# Patient Record
Sex: Female | Born: 1955 | Race: White | Hispanic: No | Marital: Married | Smoking: Former smoker
Health system: Southern US, Community
[De-identification: ages and names within clinical notes are randomized; demographics above are authoritative.]

## PROBLEM LIST (undated history)

## (undated) DIAGNOSIS — S60529A Blister (nonthermal) of unspecified hand, initial encounter: Secondary | ICD-10-CM

## (undated) DIAGNOSIS — Z8619 Personal history of other infectious and parasitic diseases: Secondary | ICD-10-CM

## (undated) DIAGNOSIS — J40 Bronchitis, not specified as acute or chronic: Secondary | ICD-10-CM

## (undated) DIAGNOSIS — G8929 Other chronic pain: Secondary | ICD-10-CM

## (undated) DIAGNOSIS — A6920 Lyme disease, unspecified: Secondary | ICD-10-CM

## (undated) DIAGNOSIS — Z8489 Family history of other specified conditions: Secondary | ICD-10-CM

## (undated) DIAGNOSIS — M1990 Unspecified osteoarthritis, unspecified site: Secondary | ICD-10-CM

## (undated) DIAGNOSIS — E785 Hyperlipidemia, unspecified: Secondary | ICD-10-CM

## (undated) DIAGNOSIS — F988 Other specified behavioral and emotional disorders with onset usually occurring in childhood and adolescence: Secondary | ICD-10-CM

## (undated) DIAGNOSIS — F329 Major depressive disorder, single episode, unspecified: Secondary | ICD-10-CM

## (undated) DIAGNOSIS — F32A Depression, unspecified: Secondary | ICD-10-CM

## (undated) DIAGNOSIS — M8580 Other specified disorders of bone density and structure, unspecified site: Secondary | ICD-10-CM

## (undated) DIAGNOSIS — G5603 Carpal tunnel syndrome, bilateral upper limbs: Secondary | ICD-10-CM

## (undated) DIAGNOSIS — M797 Fibromyalgia: Secondary | ICD-10-CM

## (undated) DIAGNOSIS — L309 Dermatitis, unspecified: Secondary | ICD-10-CM

## (undated) DIAGNOSIS — G4730 Sleep apnea, unspecified: Secondary | ICD-10-CM

## (undated) DIAGNOSIS — Z9289 Personal history of other medical treatment: Secondary | ICD-10-CM

## (undated) DIAGNOSIS — F419 Anxiety disorder, unspecified: Secondary | ICD-10-CM

## (undated) DIAGNOSIS — R011 Cardiac murmur, unspecified: Secondary | ICD-10-CM

## (undated) HISTORY — DX: Hyperlipidemia, unspecified: E78.5

## (undated) HISTORY — PX: BACK SURGERY: SHX140

## (undated) HISTORY — PX: ABDOMINAL HYSTERECTOMY: SHX81

## (undated) HISTORY — DX: Dermatitis, unspecified: L30.9

## (undated) HISTORY — PX: CARPAL TUNNEL RELEASE: SHX101

## (undated) HISTORY — DX: Lyme disease, unspecified: A69.20

## (undated) HISTORY — DX: Personal history of other infectious and parasitic diseases: Z86.19

## (undated) HISTORY — DX: Other specified disorders of bone density and structure, unspecified site: M85.80

## (undated) HISTORY — PX: EYE SURGERY: SHX253

## (undated) HISTORY — PX: JOINT REPLACEMENT: SHX530

## (undated) HISTORY — DX: Unspecified osteoarthritis, unspecified site: M19.90

## (undated) HISTORY — PX: KNEE ARTHROSCOPY: SHX127

## (undated) HISTORY — DX: Cardiac murmur, unspecified: R01.1

## (undated) HISTORY — PX: TENDON REPAIR: SHX5111

---

## 2000-04-15 ENCOUNTER — Other Ambulatory Visit: Admission: RE | Admit: 2000-04-15 | Discharge: 2000-04-15 | Payer: Self-pay | Admitting: Orthopedic Surgery

## 2001-01-12 ENCOUNTER — Ambulatory Visit (HOSPITAL_BASED_OUTPATIENT_CLINIC_OR_DEPARTMENT_OTHER): Admission: RE | Admit: 2001-01-12 | Discharge: 2001-01-12 | Payer: Self-pay | Admitting: *Deleted

## 2001-02-08 ENCOUNTER — Ambulatory Visit (HOSPITAL_BASED_OUTPATIENT_CLINIC_OR_DEPARTMENT_OTHER): Admission: RE | Admit: 2001-02-08 | Discharge: 2001-02-08 | Payer: Self-pay | Admitting: *Deleted

## 2001-07-13 ENCOUNTER — Encounter: Payer: Self-pay | Admitting: *Deleted

## 2001-07-13 ENCOUNTER — Encounter: Admission: RE | Admit: 2001-07-13 | Discharge: 2001-07-13 | Payer: Self-pay | Admitting: *Deleted

## 2001-10-04 ENCOUNTER — Other Ambulatory Visit: Admission: RE | Admit: 2001-10-04 | Discharge: 2001-10-04 | Payer: Self-pay | Admitting: Family Medicine

## 2002-01-05 ENCOUNTER — Encounter: Payer: Self-pay | Admitting: *Deleted

## 2002-01-05 ENCOUNTER — Encounter: Admission: RE | Admit: 2002-01-05 | Discharge: 2002-01-05 | Payer: Self-pay | Admitting: *Deleted

## 2003-12-28 HISTORY — PX: LAPAROSCOPIC GASTRIC BANDING: SHX1100

## 2003-12-28 HISTORY — PX: HIP SURGERY: SHX245

## 2004-01-22 DIAGNOSIS — Z9884 Bariatric surgery status: Secondary | ICD-10-CM | POA: Insufficient documentation

## 2004-08-27 DIAGNOSIS — Z9289 Personal history of other medical treatment: Secondary | ICD-10-CM

## 2004-08-27 HISTORY — DX: Personal history of other medical treatment: Z92.89

## 2004-09-22 ENCOUNTER — Ambulatory Visit: Payer: Self-pay | Admitting: Physical Medicine & Rehabilitation

## 2004-09-22 ENCOUNTER — Inpatient Hospital Stay (HOSPITAL_COMMUNITY): Admission: RE | Admit: 2004-09-22 | Discharge: 2004-09-25 | Payer: Self-pay | Admitting: Orthopedic Surgery

## 2008-03-12 ENCOUNTER — Encounter: Admission: RE | Admit: 2008-03-12 | Discharge: 2008-03-12 | Payer: Self-pay | Admitting: Family Medicine

## 2008-07-15 ENCOUNTER — Encounter: Admission: RE | Admit: 2008-07-15 | Discharge: 2008-07-15 | Payer: Self-pay | Admitting: Family Medicine

## 2010-04-11 ENCOUNTER — Emergency Department (HOSPITAL_COMMUNITY): Admission: EM | Admit: 2010-04-11 | Discharge: 2010-04-12 | Payer: Self-pay | Admitting: Emergency Medicine

## 2010-10-01 ENCOUNTER — Encounter: Admission: RE | Admit: 2010-10-01 | Discharge: 2010-10-01 | Payer: Self-pay | Admitting: Specialist

## 2011-05-14 NOTE — Op Note (Signed)
Abigail Huerta, [name]:  [id number]   MEDICAL RECORD NO.:  [id number]          PATIENT TYPE:  INP   LOCATION:  X004                         FACILITY:  [facility]   PHYSICIAN:  Madlyn Frankel. Charlann Boxer, M.D.  DATE OF BIRTH:  1956/02/04   DATE OF PROCEDURE:  09/22/2004  DATE OF DISCHARGE:                                 OPERATIVE REPORT   PREOPERATIVE DIAGNOSIS:  Right hip osteoarthritis.   POSTOPERATIVE DIAGNOSIS:  Right hip osteoarthritis.   PROCEDURE:  Right total hip replacement.   COMPONENTS USED:  DePuy hip system TriLock 12.5 standard hip stem with a 36  1.5 mm ball, a 36 + 1.5 mm ball, a 52 mm Pinnacle cup and a 36 neutral metal  liner.   SURGEON:  Madlyn Frankel. Charlann Boxer, M.D.   ASSISTANTDruscilla Brownie. Cherlynn June.   ANESTHESIA:  General.   BLOOD LOSS:  800 cc.   IV FLUIDS:  2500 cc LR.   DRAINS:  One.   COMPLICATIONS:  None.   INDICATIONS FOR PROCEDURE:  Abigail Huerta is a 55 year old female with an  extensive history of right hip pain.  She also has chronic pain related to  some back issues.  She has been followed and had multiple conservative  measures, including intra-articular hip injections, which failed to provide  relief.  After several attempts at managing her right hip pain presenting as  groin pain with conservative measures, she grew leery of further attempts.  After this, we discussed hip replacement as an option for her.  We discussed  risks and benefits of the procedure.  We discussed bearing surfaces, and  after this discussion, she consented with a right total hip replacement and  wished to have a metal-on-metal bearing surface, given her age.   PROCEDURE IN DETAIL:  Patient was brought to the operating theater.  Once  adequate anesthesia, preoperative antibiotics were administered, 1 gm of  Ancef.  The patient was positioned in the left lateral decubitus position  with the right side up.  The right lower extremity was then prepped and  draped in a sterile fashion.  A slightly curvilinear lateral-based incision  was made for the posterior approach to the hip.  Short external rotators and  __________ were taken down separate from the posterior capsule.  Both were  later than repaired to the trochanter and gluteus medius tendon  respectively.  The hip was dislocated, and a neck osteotomy made, based on  preoperative templates and anatomic landmarks.  At this point, attention was  directed to the femur.  The femur was prepared for a trial of locked hip  stem.  Initial broaching was carried up to an 11.3 mm broach to allow for  assessment of the amount of eversion present in the femur that would then be  able to dictate more so on the acetabulum, as necessary.  Following this,  attention was directed to the acetabulum.  Following acetabular exposure,  including labrectomy and exposure, the reaming commenced with a 45 reamer,  which was carried up sequentially to a 51 reamer.  This provided excellent  punctate  acetabular bone bleeding.  The 52 mm cup was then packed with  approximately 40-45 degrees of abduction, 20-15 degrees of forward flexion.  Cup position appeared adequate, compared to the anteversion in the femur.  Two screws were placed into the ileum with excellent purchase.  Then a trial  liner placed and attention redirected to the femur.   Final broaching was carried up with an 11.3, then a 12.5 mm broach.  Approximately 15 degrees of anteversion was present in the stem.  A trial  reduction was carried out with a 36.5 ball.  The hip was stable.  Range of  motion with hip flexion to 80 degrees, internal rotation to 70 degrees.  Stable in the sitting position.  The leg length appeared stable.  The  patient was stable with external rotation and internal extension.  Following  this, the trial components were removed.  The whole liner replaced with a  final 36 neutral metal liner impacted into this position.  The final  12.5  standard trial length stem was then impacted to the level of the broach  position.  A Fit 36 +1.5 ball was impacted on the clean and dry trunnion,  and the hip reduced.  The hip was stable, as noted in the trial range of  motion.  The hip was copiously irrigated with normal saline solution.  A  medium Hemovac drain was placed deep.  The posterior capsule was  reapproximated to the trochanter through drill holes.  Short external  rotators were reapproximated in the gluteus medius.  Following this, the  iliotibial band and gluteus maximus fascia were reapproximated using a #1  Vicryl.  At this point, the remainder of the wound was closed in layers with  running 4-0 Monocryl in the skin.  The patient was extubated and transferred  to the recovery room in stable condition.      MDO/MEDQ  D:  09/22/2004  T:  09/22/2004  Job:  [id number]

## 2011-05-14 NOTE — Discharge Summary (Signed)
NAMEJALIE, Abigail Huerta              ACCOUNT NO.:  [id number]   MEDICAL RECORD NO.:  [id number]          PATIENT TYPE:  INP   LOCATION:  0462                         FACILITY:  [facility]   PHYSICIAN:  Madlyn Frankel. Charlann Boxer, M.D.  DATE OF BIRTH:  1956/06/26   DATE OF ADMISSION:  09/22/2004  DATE OF DISCHARGE:  09/25/2004                                 DISCHARGE SUMMARY   ADMITTING DIAGNOSES:  1.  Severe osteoarthritis of the right hip.  2.  Seasonal allergies.   DISCHARGE DIAGNOSES:  1.  Severe osteoarthritis of the right hip.  2.  Seasonal allergies.  3.  Postoperative anemia treated with transfusion.   OPERATION:  On September 22, 2004, the patient underwent right total hip  replacement arthroplasty utilizing Dupuy Hip System with metal-on-metal  prosthesis.  Jenne Campus assisted.   BRIEF HISTORY:  This 55 year old lady with a long history of right hip pain  has had conservative measures for treatment of the right hip pain including  intraarticular hip injections, anti-inflammatories which have not provided  her with any long-lasting relief.  X-rays have shown severe degenerative  arthritis of the hip.  After much discussion and concluding the risks and  benefits of surgery, we have decided to go ahead with the above procedure.   The patient also has chronic pain secondary to some back issues treated by  Dr. Ethelene Hal.   HOSPITAL COURSE:  The patient tolerated her surgical procedure quite well.  She had an extremely high need for analgesics postoperatively.  We supplied  her with these utilizing PCA pump as well as her Duragesic patch, Vicodin,  and Percocet.  Zanaflex was used as a muscle relaxant.  She used these  freely.  We were able to wean her eventually from the PCA, then to Vicodin.  We continued with her Duragesic patch.  Hemoglobin became somewhat critical  postoperatively, dropping to 8.5.  She was quite pale and after a discussion  with her and her approval she was  transfused with 2 units autologous blood  for her postoperative anemia.  This brought her hemoglobin up to 10.4 with  hematocrit of 30.7.  On the morning of discharge after transfusion, she felt  much better and was more alert.  She was completely and fully oriented,  anxious to go home at the time of discharge.  Dr. Charlann Boxer saw the patient prior  to discharge and cleared her for home health.  At the time of discharge, the  patient was walking 90 feet with a rolling walker, able to maintain 50%  partial weightbearing to the right lower extremity.  Neurovascular was  intact to the right hip.  Dressing was dry.   Early on, we thought she might be of benefit for an inpatient rehabilitation  program.  Dr. Thomasena Edis saw the patient from [facility] and since her high  level of activity was achieved in the hospital that she would do best with  home health.   Laboratory values in the hospital hematologically showed a preoperative CBC  completely within normal limits.  Hemoglobin was 12.5, hematocrit was 36.6.  Final  hemoglobin showed 10.4 with hematocrit of 30.7.  Blood chemistries  remained stable and normal.  Urinalysis essentially negative for urinary  tract infection.  Blood transfused was O+.  Chest x-ray showed a normal  chest.  Electrocardiogram showed sinus bradycardia with low-voltage QRS.   CONDITION ON DISCHARGE:  Improved, stable.   PLAN:  The patient discharged to her home in the care of her family.  She  plans to be with her sister.  She is to have home health with [place].  Continue on Coumadin protocol four weeks after the date of surgery.  She is  to continue 50% weightbearing with a partial weightbearing to the right  lower extremity.  Discontinue TED hose when she is feeling better and  ambulating better.  She is to continue with home medications and diet.  She  is given a prescription for Vicodin #50 with a refill 1-2 q.4-6 h. p.r.n.  pain, Zanaflex #40 with a refill 1-2 q.4-6  h. p.r.n. muscle spasm, Trinsicon  #60 one b.i.d., Duragesic patch  50 mcg #10 change the patch every 72 hours, and Coumadin per pharmacy  protocol.  May use dry dressing p.r.n.  Shower on the fourth postoperative  day.  Continue with hip precautions.      DLU/MEDQ  D:  09/25/2004  T:  09/25/2004  Job:  [id number]   cc:   Gretta Arab. Valentina Lucks, M.D.  [address] [place]  [place] [address]  Fax: [phone]

## 2011-05-14 NOTE — H&P (Signed)
NAMELILYANNE, Abigail Huerta              ACCOUNT NO.:  [id number]   MEDICAL RECORD NO.:  [id number]          PATIENT TYPE:  INP   LOCATION:  NA                           FACILITY:  [facility]   PHYSICIAN:  Madlyn Frankel. Charlann Boxer, M.D.  DATE OF BIRTH:  1956-01-31   DATE OF ADMISSION:  09/22/2004  DATE OF DISCHARGE:                                HISTORY & PHYSICAL   BRIEF HISTORY:  Patient is a 55 year old female comes in complaining about  right hip pain.  She states that this right hip pain has been off and on for  the past 20 years.  It has been continually getting worse.  Her symptoms  have been refractory to any type of conservative therapy or injections.  Thus, she has elected to have a total hip arthroplasty to the right hip to  be completed by Dr. Durene Romans.   ALLERGIES AND SENSITIVITIES:  MORPHINE.   MEDICATIONS:  1.  Zyrtec 10 mg once daily.  2.  Paxil 20 mg once daily.  3.  Wellbutrin 300 mg once daily.  4.  Ultram 50 mg q.6h. p.r.n. pain.  5.  Ativan 1 mg t.i.d. p.r.n. pain.  6.  Duragesic patch daily.   PAST MEDICAL HISTORY:  Seasonal allergies and osteoarthritis right hip.   SOCIAL HISTORY:  She does not smoke, she lives in a two-level house, and her  primary care physician is [employer].   PAST SURGICAL HISTORY:  Hysterectomy and a lap band bariatric surgery.   FAMILY MEDICAL HISTORY:  Negative.   REVIEW OF SYSTEMS:  Negative except for difficulty with ambulation due to  the right hip pain.   PHYSICAL EXAMINATION:  Patient has a pulse of 82, respirations 18, blood  pressure 110/78.  Abigail Huerta is a 55 year old healthy-appearing female in  no acute distress, pleasant mood and affect, alert and oriented x3.  Examination of head and neck shows cranial nerves II-XII grossly intact.  Patient is somewhat hoarse today here in the office but no signs of gross  infection, sinusitis, or strep throat.  Neck has full range of motion  without any difficulty, no tenderness to  palpation paraspinal muscles  spinous process.  Spurling test negative x2.  Examination of chest shows  breath sounds are equal bilaterally, no wheezes, rhonchi, or rales.  Heart  regular rate and rhythm, no murmur.  Abdomen has active bowel sounds in all  quadrants, nontender, nondistended, no pulsatile masses noted.  Extremities  shows moderate tenderness to the right hip.  She does have an antalgic gait  due to this hip pain but neurovascularly she is intact.  Skin is intact  distally.  She does have some mild tenderness to the lumbar spine between L4  through S1 with no active muscle spasm.  Skin shows no rashes or signs of  phlebitis and no pedal edema.   X-RAYS:  Right hip osteoarthritis, severe.   IMPRESSION:  Right hip osteoarthritis, severe.   PLAN:  Plan is to have a right total hip arthroplasty to be completed by Dr.  Durene Romans on September 22, 2004.  TBD/MEDQ  D:  09/16/2004  T:  09/16/2004  Job:  [id number]

## 2011-06-29 ENCOUNTER — Emergency Department (HOSPITAL_COMMUNITY): Payer: Medicare Other

## 2011-06-29 ENCOUNTER — Emergency Department (HOSPITAL_COMMUNITY)
Admission: EM | Admit: 2011-06-29 | Discharge: 2011-06-29 | Disposition: A | Discharge status: Home / Self Care | Payer: Medicare Other | Attending: Emergency Medicine | Admitting: Emergency Medicine

## 2011-06-29 DIAGNOSIS — M25559 Pain in unspecified hip: Secondary | ICD-10-CM | POA: Insufficient documentation

## 2011-06-29 DIAGNOSIS — Z79899 Other long term (current) drug therapy: Secondary | ICD-10-CM | POA: Insufficient documentation

## 2011-06-29 DIAGNOSIS — Z96649 Presence of unspecified artificial hip joint: Secondary | ICD-10-CM | POA: Insufficient documentation

## 2011-07-26 ENCOUNTER — Other Ambulatory Visit: Payer: Self-pay | Admitting: Orthopedic Surgery

## 2011-07-26 DIAGNOSIS — M4800 Spinal stenosis, site unspecified: Secondary | ICD-10-CM

## 2011-07-26 MED ORDER — DIAZEPAM 2 MG PO TABS
10.0000 mg | ORAL_TABLET | Freq: Once | ORAL | Status: AC
  Administered 2011-07-27: 10 mg via ORAL

## 2011-07-27 ENCOUNTER — Ambulatory Visit
Admission: RE | Admit: 2011-07-27 | Discharge: 2011-07-27 | Disposition: A | Discharge status: Home / Self Care | Payer: Medicare Other | Source: Ambulatory Visit | Attending: Orthopedic Surgery | Admitting: Orthopedic Surgery

## 2011-07-27 DIAGNOSIS — M4800 Spinal stenosis, site unspecified: Secondary | ICD-10-CM

## 2011-07-27 DIAGNOSIS — M48061 Spinal stenosis, lumbar region without neurogenic claudication: Secondary | ICD-10-CM

## 2011-07-27 MED ORDER — IOHEXOL 180 MG/ML  SOLN
15.0000 mL | Freq: Once | INTRAMUSCULAR | Status: AC | PRN
  Administered 2011-07-27: 15 mL via INTRATHECAL

## 2011-07-27 MED ORDER — DEXTROSE-NACL 5-0.45 % IV SOLN: INTRAVENOUS | Status: DC

## 2011-07-27 NOTE — Progress Notes (Signed)
Resting quietly in recovery area.  Denies pain.  jl

## 2012-01-11 DIAGNOSIS — E785 Hyperlipidemia, unspecified: Secondary | ICD-10-CM | POA: Diagnosis not present

## 2012-01-11 DIAGNOSIS — E669 Obesity, unspecified: Secondary | ICD-10-CM | POA: Diagnosis not present

## 2012-01-11 DIAGNOSIS — G4733 Obstructive sleep apnea (adult) (pediatric): Secondary | ICD-10-CM | POA: Diagnosis not present

## 2012-01-11 DIAGNOSIS — IMO0002 Reserved for concepts with insufficient information to code with codable children: Secondary | ICD-10-CM | POA: Diagnosis not present

## 2012-01-11 DIAGNOSIS — K219 Gastro-esophageal reflux disease without esophagitis: Secondary | ICD-10-CM | POA: Diagnosis not present

## 2012-01-11 DIAGNOSIS — M949 Disorder of cartilage, unspecified: Secondary | ICD-10-CM | POA: Diagnosis not present

## 2012-01-11 DIAGNOSIS — F411 Generalized anxiety disorder: Secondary | ICD-10-CM | POA: Diagnosis not present

## 2012-01-11 DIAGNOSIS — G8929 Other chronic pain: Secondary | ICD-10-CM | POA: Diagnosis not present

## 2012-03-10 DIAGNOSIS — M4310 Spondylolisthesis, site unspecified: Secondary | ICD-10-CM | POA: Diagnosis not present

## 2012-03-10 DIAGNOSIS — M5137 Other intervertebral disc degeneration, lumbosacral region: Secondary | ICD-10-CM | POA: Diagnosis not present

## 2012-03-10 DIAGNOSIS — M5030 Other cervical disc degeneration, unspecified cervical region: Secondary | ICD-10-CM | POA: Diagnosis not present

## 2012-04-13 DIAGNOSIS — F319 Bipolar disorder, unspecified: Secondary | ICD-10-CM | POA: Diagnosis not present

## 2012-04-13 DIAGNOSIS — J040 Acute laryngitis: Secondary | ICD-10-CM | POA: Diagnosis not present

## 2012-04-17 DIAGNOSIS — F319 Bipolar disorder, unspecified: Secondary | ICD-10-CM | POA: Diagnosis not present

## 2012-04-28 DIAGNOSIS — E785 Hyperlipidemia, unspecified: Secondary | ICD-10-CM | POA: Diagnosis not present

## 2012-05-02 DIAGNOSIS — M545 Low back pain: Secondary | ICD-10-CM | POA: Diagnosis not present

## 2012-05-15 DIAGNOSIS — F319 Bipolar disorder, unspecified: Secondary | ICD-10-CM | POA: Diagnosis not present

## 2012-05-19 DIAGNOSIS — M5137 Other intervertebral disc degeneration, lumbosacral region: Secondary | ICD-10-CM | POA: Diagnosis not present

## 2012-05-19 DIAGNOSIS — M4120 Other idiopathic scoliosis, site unspecified: Secondary | ICD-10-CM | POA: Diagnosis not present

## 2012-05-20 ENCOUNTER — Encounter (HOSPITAL_COMMUNITY): Payer: Self-pay | Admitting: Licensed Clinical Social Worker

## 2012-05-20 ENCOUNTER — Ambulatory Visit (HOSPITAL_COMMUNITY)
Admission: AD | Admit: 2012-05-20 | Discharge: 2012-05-20 | Disposition: A | Discharge status: Home / Self Care | Payer: Managed Care, Other (non HMO) | Attending: Psychiatry | Admitting: Psychiatry

## 2012-05-20 DIAGNOSIS — Z9884 Bariatric surgery status: Secondary | ICD-10-CM | POA: Diagnosis not present

## 2012-05-20 DIAGNOSIS — F909 Attention-deficit hyperactivity disorder, unspecified type: Secondary | ICD-10-CM | POA: Insufficient documentation

## 2012-05-20 DIAGNOSIS — F39 Unspecified mood [affective] disorder: Secondary | ICD-10-CM | POA: Diagnosis not present

## 2012-05-20 DIAGNOSIS — M129 Arthropathy, unspecified: Secondary | ICD-10-CM | POA: Diagnosis not present

## 2012-05-20 DIAGNOSIS — F411 Generalized anxiety disorder: Secondary | ICD-10-CM | POA: Insufficient documentation

## 2012-05-20 DIAGNOSIS — IMO0001 Reserved for inherently not codable concepts without codable children: Secondary | ICD-10-CM | POA: Diagnosis not present

## 2012-05-20 HISTORY — DX: Anxiety disorder, unspecified: F41.9

## 2012-05-20 HISTORY — DX: Depression, unspecified: F32.A

## 2012-05-20 HISTORY — DX: Unspecified osteoarthritis, unspecified site: M19.90

## 2012-05-20 HISTORY — DX: Major depressive disorder, single episode, unspecified: F32.9

## 2012-05-20 HISTORY — DX: Fibromyalgia: M79.7

## 2012-05-20 NOTE — BH Assessment (Signed)
Assessment Note   Abigail Huerta is an 56 y.o. female, married, white who presents to [facility] saying "I'm nuts." Pt's husband reports "I think she is having a nervous breakdown." Pt reports she has a long history of depression and anxiety and has been treated for years, primarily by her PCP's. She recently began seeing Tiajuana Amass who reportedly diagnosed Pt with ADD. Pt reports she has been feeling severely depressed "since February 27, 2012 at 4:30 am when my dog Sirus died." Pt reports daily crying spells, severe anxiety with panic attacks, frustration, poor concentration, social withdrawal and feelings of hopelessness. She and her husband both reports Pt gets 1-3 hours of sleep at night and a couple of hours sleep during the day. She denies any current or past suicidal ideation, suicidal gestures or self-harm behaviors. She denies any homicidal ideation or history of violence. She denies any psychotic symptoms.  When asked why she came for assessment today she reports that she had conflicts with two of her sisters, one conflict involving her sister's dog. Pt reports she has been under stress due to financial problems and she and her husband have filed for bankruptcy. She also has multiple medical problems including degenerative disc disease, fibromyalgia, arthritis, chronic pain (currently 5/10) and a skin condition. She feels overwhelmed and feels she cannot manage daily stress and activities.   Pt reports she takes her medications as prescribed. She cannot remember the details of her medications and reports they include Lexapro, Vyvance, Oxycodone, transdermal patch, Ativan and Zanaflex. She says her pharmacy is [facility] and she has the pharmacy telephone number memorized.   Pt reports her mother and her grandmother both have serious mental health problems. Pt anticipated inpatient psychiatric treatment and said she was interested in "shock treatments." Explained to Pt that usually people  were admitted inpatient for imminent safety issues and recommended she attend Psych IOP. Pt agreed to attend program and would like to start as soon as possible.   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 Depression   . Anxiety   . Fibromyalgia   . Arthritis    Axis IV: educational problems and problems related to social environment Axis V: GAF=50  Past Medical History:  Past Medical History  Diagnosis Date  . Depression   . Anxiety   . Fibromyalgia   . Arthritis     Past Surgical History  Procedure Date  . Hip surgery 2005    Hip replacement  . Laparoscopic gastric banding 2005    Family History: No family history on file.  Social History:  reports that she has never smoked. She does not have any smokeless tobacco history on file. She reports that she does not drink alcohol or use illicit drugs.  Additional Social History:  Alcohol / Drug Use Pain Medications: Denies Prescriptions: Denies Over the Counter: Denies History of alcohol / drug use?: No history of alcohol / drug abuse Longest period of sobriety (when/how long): Denies  CIWA:   COWS:    Allergies: No Known Allergies  Home Medications:  (Not in a hospital admission)  OB/GYN Status:  No LMP recorded.  General Assessment Data Location of Assessment: [facility] Assessment Services Living Arrangements: Spouse/significant other;Children (Husband, son (62), granddaughter) Can pt return to current living arrangement?: Yes Admission Status: Voluntary Is patient capable of signing voluntary admission?: Yes Transfer from: Home Referral Source: Self/Family/Friend  Education  Status Is patient currently in school?: No  Risk to self Suicidal Ideation: No Suicidal Intent: No Is patient at risk for suicide?: No Suicidal Plan?: No Access to Means: No What has been your use of drugs/alcohol within the last 12 months?: Pt  denies Previous Attempts/Gestures: No How many times?: 0  Other Self Harm Risks: None Triggers for Past Attempts: None known Intentional Self Injurious Behavior: None Family Suicide History: See progress notes;No Recent stressful life event(s): Financial Problems;Conflict (Comment) (Bankruptcy, conflict with sisters) Persecutory voices/beliefs?: No Depression: Yes Depression Symptoms: Despondent;Insomnia;Tearfulness;Isolating;Fatigue;Loss of interest in usual pleasures Substance abuse history and/or treatment for substance abuse?: No Suicide prevention information given to non-admitted patients: Yes  Risk to Others Homicidal Ideation: No Thoughts of Harm to Others: No Current Homicidal Intent: No Current Homicidal Plan: No Access to Homicidal Means: No Identified Victim: None  History of harm to others?: No Assessment of Violence: None Noted Violent Behavior Description: Pt denies any history of violence Does patient have access to weapons?: No Criminal Charges Pending?: No Does patient have a court date: No  Psychosis Hallucinations: None noted Delusions: None noted  Mental Status Report Appear/Hygiene: Other (Comment) (Somewhat desheveled) Eye Contact: Good Motor Activity: Unremarkable Speech: Logical/coherent Level of Consciousness: Alert Mood: Depressed Affect: Depressed;Other (Comment) (Affect not always congruent with mood) Anxiety Level: Panic Attacks Panic attack frequency: 2-3 times per month Most recent panic attack: 1 week ago Thought Processes: Coherent Judgement: Unimpaired Orientation: Person;Place;Time;Situation Obsessive Compulsive Thoughts/Behaviors: None  Cognitive Functioning Concentration: Decreased Memory: Recent Intact;Remote Intact IQ: Average Insight: Fair Impulse Control: Fair Appetite: Good Weight Loss: 0  Weight Gain: 0  Sleep: Decreased Total Hours of Sleep: 3  Vegetative Symptoms: Staying in bed  ADLScreening [facility] Assessment  Services) Patient's cognitive ability adequate to safely complete daily activities?: Yes Patient able to express need for assistance with ADLs?: Yes Independently performs ADLs?: Yes  Abuse/Neglect [facility]) Physical Abuse: Denies Verbal Abuse: Denies Sexual Abuse: Denies  Prior Inpatient Therapy Prior Inpatient Therapy: No Prior Therapy Dates: NA Prior Therapy Facilty/Provider(s): NA Reason for Treatment: NA  Prior Outpatient Therapy Prior Outpatient Therapy: Yes Prior Therapy Dates: 03/2012-Current Prior Therapy Facilty/Provider(s): Tiajuana Amass, MD Reason for Treatment: Depression, anxiety, ADD  ADL Screening (condition at time of admission) Patient's cognitive ability adequate to safely complete daily activities?: Yes Patient able to express need for assistance with ADLs?: Yes Independently performs ADLs?: Yes Weakness of Legs: None Weakness of Arms/Hands: None  Home Assistive Devices/Equipment Home Assistive Devices/Equipment: None    Abuse/Neglect Assessment (Assessment to be complete while patient is alone) Physical Abuse: Denies Verbal Abuse: Denies Sexual Abuse: Denies Exploitation of patient/patient's resources: Denies Self-Neglect: Denies     Merchant navy officer (For Healthcare) Advance Directive: Patient does not have advance directive;Patient would not like information Pre-existing out of facility DNR order (yellow form or pink MOST form): No Nutrition Screen Diet: Regular Unintentional weight loss greater than 10lbs within the last month: No Problems chewing or swallowing foods and/or liquids: No Home Tube Feeding or Total Parenteral Nutrition (TPN): No Patient appears severely malnourished: No Pregnant or Lactating: No  Additional Information 1:1 In Past 12 Months?: No CIRT Risk: No Elopement Risk: No Does patient have medical clearance?: No     Disposition:  Disposition Disposition of Patient: Outpatient treatment Type of outpatient  treatment: Psych Intensive Outpatient  On Site Evaluation by:   Reviewed with Physician:   Pt agrees to Psychiatric Intensive Outpatient Program.    Pamalee Leyden 05/20/2012 11:02 PM

## 2012-06-12 DIAGNOSIS — F909 Attention-deficit hyperactivity disorder, unspecified type: Secondary | ICD-10-CM | POA: Diagnosis not present

## 2012-06-16 DIAGNOSIS — M47814 Spondylosis without myelopathy or radiculopathy, thoracic region: Secondary | ICD-10-CM | POA: Diagnosis not present

## 2012-06-16 DIAGNOSIS — M47817 Spondylosis without myelopathy or radiculopathy, lumbosacral region: Secondary | ICD-10-CM | POA: Diagnosis not present

## 2012-06-16 DIAGNOSIS — M5137 Other intervertebral disc degeneration, lumbosacral region: Secondary | ICD-10-CM | POA: Diagnosis not present

## 2012-06-16 DIAGNOSIS — IMO0002 Reserved for concepts with insufficient information to code with codable children: Secondary | ICD-10-CM | POA: Diagnosis not present

## 2012-08-08 DIAGNOSIS — F909 Attention-deficit hyperactivity disorder, unspecified type: Secondary | ICD-10-CM | POA: Diagnosis not present

## 2012-09-15 DIAGNOSIS — M5137 Other intervertebral disc degeneration, lumbosacral region: Secondary | ICD-10-CM | POA: Diagnosis not present

## 2012-09-15 DIAGNOSIS — M47817 Spondylosis without myelopathy or radiculopathy, lumbosacral region: Secondary | ICD-10-CM | POA: Diagnosis not present

## 2012-09-15 DIAGNOSIS — IMO0002 Reserved for concepts with insufficient information to code with codable children: Secondary | ICD-10-CM | POA: Diagnosis not present

## 2012-09-15 DIAGNOSIS — M545 Low back pain: Secondary | ICD-10-CM | POA: Diagnosis not present

## 2012-10-20 DIAGNOSIS — R52 Pain, unspecified: Secondary | ICD-10-CM | POA: Diagnosis not present

## 2012-10-20 DIAGNOSIS — R5381 Other malaise: Secondary | ICD-10-CM | POA: Diagnosis not present

## 2012-10-20 DIAGNOSIS — R5383 Other fatigue: Secondary | ICD-10-CM | POA: Diagnosis not present

## 2012-12-01 DIAGNOSIS — F909 Attention-deficit hyperactivity disorder, unspecified type: Secondary | ICD-10-CM | POA: Diagnosis not present

## 2012-12-05 DIAGNOSIS — M545 Low back pain: Secondary | ICD-10-CM | POA: Diagnosis not present

## 2012-12-15 DIAGNOSIS — IMO0002 Reserved for concepts with insufficient information to code with codable children: Secondary | ICD-10-CM | POA: Diagnosis not present

## 2012-12-15 DIAGNOSIS — M47817 Spondylosis without myelopathy or radiculopathy, lumbosacral region: Secondary | ICD-10-CM | POA: Diagnosis not present

## 2012-12-15 DIAGNOSIS — M5137 Other intervertebral disc degeneration, lumbosacral region: Secondary | ICD-10-CM | POA: Diagnosis not present

## 2012-12-29 DIAGNOSIS — T1510XA Foreign body in conjunctival sac, unspecified eye, initial encounter: Secondary | ICD-10-CM | POA: Diagnosis not present

## 2013-01-10 DIAGNOSIS — K219 Gastro-esophageal reflux disease without esophagitis: Secondary | ICD-10-CM | POA: Diagnosis not present

## 2013-01-10 DIAGNOSIS — R079 Chest pain, unspecified: Secondary | ICD-10-CM | POA: Diagnosis not present

## 2013-02-16 DIAGNOSIS — M47817 Spondylosis without myelopathy or radiculopathy, lumbosacral region: Secondary | ICD-10-CM | POA: Diagnosis not present

## 2013-02-16 DIAGNOSIS — M5137 Other intervertebral disc degeneration, lumbosacral region: Secondary | ICD-10-CM | POA: Diagnosis not present

## 2013-02-16 DIAGNOSIS — IMO0002 Reserved for concepts with insufficient information to code with codable children: Secondary | ICD-10-CM | POA: Diagnosis not present

## 2013-02-22 DIAGNOSIS — M25559 Pain in unspecified hip: Secondary | ICD-10-CM | POA: Diagnosis not present

## 2013-02-22 DIAGNOSIS — M1710 Unilateral primary osteoarthritis, unspecified knee: Secondary | ICD-10-CM | POA: Diagnosis not present

## 2013-02-22 DIAGNOSIS — M25569 Pain in unspecified knee: Secondary | ICD-10-CM | POA: Diagnosis not present

## 2013-03-10 DIAGNOSIS — J0120 Acute ethmoidal sinusitis, unspecified: Secondary | ICD-10-CM | POA: Diagnosis not present

## 2013-03-14 DIAGNOSIS — Z96649 Presence of unspecified artificial hip joint: Secondary | ICD-10-CM | POA: Diagnosis not present

## 2013-04-03 ENCOUNTER — Encounter (HOSPITAL_COMMUNITY): Payer: Self-pay | Admitting: Pharmacy Technician

## 2013-04-04 DIAGNOSIS — Z96649 Presence of unspecified artificial hip joint: Secondary | ICD-10-CM | POA: Diagnosis not present

## 2013-04-05 ENCOUNTER — Inpatient Hospital Stay (HOSPITAL_COMMUNITY): Admission: RE | Admit: 2013-04-05 | Payer: Managed Care, Other (non HMO) | Source: Ambulatory Visit

## 2013-04-06 NOTE — Patient Instructions (Addendum)
Abigail Huerta  04/06/2013   Your procedure is scheduled on:  04/17/13   Report to [facility] at    0725  AM.  Call this number if you have problems the morning of surgery: [phone]   Remember:   Do not eat food or drink liquids after midnight.   Take these medicines the morning of surgery with A SIP OF WATER:    Do not wear jewelry, make-up or nail polish.  Do not wear lotions, powders, or perfumes.   Do not shave 48 hours prior to surgery.   Do not bring valuables to the hospital.  Contacts, dentures or bridgework may not be worn into surgery.  Leave suitcase in the car. After surgery it may be brought to your room.  For patients admitted to the hospital, checkout time is 11:00 AM the day of  discharge.      SEE CHG INSTRUCTION SHEET    Please read over the following fact sheets that you were given: MRSA Information, coughing and deep breathing exercises, leg exercises, Blood Transfusion Fact Sheet, Incentive Spirometry Fact Sheet                Failure to comply with these instructions may result in cancellation of your surgery.                Patient Signature ____________________________              Nurse Signature _____________________________

## 2013-04-09 ENCOUNTER — Inpatient Hospital Stay (HOSPITAL_COMMUNITY)
Admission: RE | Admit: 2013-04-09 | Discharge: 2013-04-09 | Disposition: A | Discharge status: Home / Self Care | Payer: Managed Care, Other (non HMO) | Source: Ambulatory Visit

## 2013-04-10 ENCOUNTER — Encounter (HOSPITAL_COMMUNITY): Payer: Self-pay

## 2013-04-10 ENCOUNTER — Encounter (HOSPITAL_COMMUNITY)
Admission: RE | Admit: 2013-04-10 | Discharge: 2013-04-10 | Disposition: A | Discharge status: Home / Self Care | Payer: Managed Care, Other (non HMO) | Source: Ambulatory Visit | Attending: Orthopedic Surgery | Admitting: Orthopedic Surgery

## 2013-04-10 DIAGNOSIS — F411 Generalized anxiety disorder: Secondary | ICD-10-CM | POA: Diagnosis present

## 2013-04-10 DIAGNOSIS — D62 Acute posthemorrhagic anemia: Secondary | ICD-10-CM | POA: Diagnosis not present

## 2013-04-10 DIAGNOSIS — J4489 Other specified chronic obstructive pulmonary disease: Secondary | ICD-10-CM | POA: Diagnosis not present

## 2013-04-10 DIAGNOSIS — M1610 Unilateral primary osteoarthritis, unspecified hip: Secondary | ICD-10-CM | POA: Diagnosis not present

## 2013-04-10 DIAGNOSIS — G8929 Other chronic pain: Secondary | ICD-10-CM | POA: Diagnosis not present

## 2013-04-10 DIAGNOSIS — G4733 Obstructive sleep apnea (adult) (pediatric): Secondary | ICD-10-CM | POA: Diagnosis not present

## 2013-04-10 DIAGNOSIS — E785 Hyperlipidemia, unspecified: Secondary | ICD-10-CM | POA: Diagnosis not present

## 2013-04-10 DIAGNOSIS — Z01812 Encounter for preprocedural laboratory examination: Secondary | ICD-10-CM | POA: Diagnosis not present

## 2013-04-10 DIAGNOSIS — M169 Osteoarthritis of hip, unspecified: Secondary | ICD-10-CM | POA: Diagnosis not present

## 2013-04-10 DIAGNOSIS — E669 Obesity, unspecified: Secondary | ICD-10-CM | POA: Diagnosis not present

## 2013-04-10 DIAGNOSIS — M159 Polyosteoarthritis, unspecified: Secondary | ICD-10-CM | POA: Diagnosis not present

## 2013-04-10 DIAGNOSIS — J449 Chronic obstructive pulmonary disease, unspecified: Secondary | ICD-10-CM | POA: Diagnosis not present

## 2013-04-10 DIAGNOSIS — M25559 Pain in unspecified hip: Secondary | ICD-10-CM | POA: Diagnosis not present

## 2013-04-10 DIAGNOSIS — IMO0001 Reserved for inherently not codable concepts without codable children: Secondary | ICD-10-CM | POA: Diagnosis not present

## 2013-04-10 DIAGNOSIS — F329 Major depressive disorder, single episode, unspecified: Secondary | ICD-10-CM | POA: Diagnosis present

## 2013-04-10 DIAGNOSIS — G4730 Sleep apnea, unspecified: Secondary | ICD-10-CM | POA: Diagnosis not present

## 2013-04-10 HISTORY — DX: Sleep apnea, unspecified: G47.30

## 2013-04-10 LAB — URINALYSIS, ROUTINE W REFLEX MICROSCOPIC
Bilirubin Urine: NEGATIVE
Hgb urine dipstick: NEGATIVE
Ketones, ur: NEGATIVE mg/dL
Protein, ur: NEGATIVE mg/dL
Urobilinogen, UA: 1 mg/dL (ref 0.0–1.0)

## 2013-04-10 LAB — CBC
MCV: 82 fL (ref 78.0–100.0)
Platelets: 216 10*3/uL (ref 150–400)
RBC: 4.73 MIL/uL (ref 3.87–5.11)
WBC: 7.7 10*3/uL (ref 4.0–10.5)

## 2013-04-10 LAB — PROTIME-INR: Prothrombin Time: 12.2 seconds (ref 11.6–15.2)

## 2013-04-10 LAB — URINE MICROSCOPIC-ADD ON

## 2013-04-10 LAB — BASIC METABOLIC PANEL
CO2: 31 mEq/L (ref 19–32)
Calcium: 9.7 mg/dL (ref 8.4–10.5)
GFR calc non Af Amer: 62 mL/min — ABNORMAL LOW (ref >90)
Sodium: 139 mEq/L (ref 135–145)

## 2013-04-10 LAB — APTT: aPTT: 38 seconds — ABNORMAL HIGH (ref 24–37)

## 2013-04-10 NOTE — Progress Notes (Signed)
Urinalysis with micro results faxed via [facility] to Dr Olin.   

## 2013-04-11 LAB — URINE CULTURE

## 2013-04-11 NOTE — H&P (Signed)
TOTAL HIP ADMISSION H&P  Patient is admitted for left total hip arthroplasty, anterior approach.  Subjective:  Chief Complaint: Left hip OA / pain  HPI: Abigail Huerta, 57 y.o. female, has a history of pain and functional disability in the left hip(s) due to arthritis and patient has failed non-surgical conservative treatments for greater than 12 weeks to include NSAID's and/or analgesics, use of assistive devices and activity modification.  Onset of symptoms was gradual starting 1 years ago with rapidlly worsening course since that time.The patient noted no past surgery on the left hip(s).  Patient currently rates pain in the left hip at 10 out of 10 with activity. Patient has worsening of pain with activity and weight bearing, trendelenberg gait, pain that interfers with activities of daily living and pain with passive range of motion. Patient has evidence of periarticular osteophytes and joint space narrowing by imaging studies. This condition presents safety issues increasing the risk of falls. There is no current active infection.  Risks, benefits and expectations were discussed with the patient. Patient understand the risks, benefits and expectations and wishes to proceed with surgery.   D/C Plans:   Home with HHPT  Post-op Meds:    No Rx given   Tranexamic Acid:   To be given  Decadron:    To be given  FYI:   Nothing to note    Past Medical History  Diagnosis Date  . Depression   . Anxiety   . Fibromyalgia   . Arthritis   . Sleep apnea     cpap setting at 9    Past Surgical History  Procedure Laterality Date  . Hip surgery  2005    Hip replacement  . Laparoscopic gastric banding  2005  . Abdominal hysterectomy      1985    No prescriptions prior to admission   No Known Allergies   History  Substance Use Topics  . Smoking status: Never Smoker   . Smokeless tobacco: Never Used  . Alcohol Use: No      Review of Systems  Constitutional: Negative.   HENT:  Negative.   Eyes: Negative.   Respiratory: Negative.   Cardiovascular: Negative.   Gastrointestinal: Negative.   Genitourinary: Negative.   Musculoskeletal: Positive for joint pain.  Skin: Negative.   Neurological: Negative.   Endo/Heme/Allergies: Negative.   Psychiatric/Behavioral: Negative.     Objective:  Physical Exam  Constitutional: She is oriented to person, place, and time. She appears well-developed and well-nourished.  HENT:  Head: Normocephalic and atraumatic.  Mouth/Throat: Oropharynx is clear and moist.  Eyes: Pupils are equal, round, and reactive to light.  Neck: Neck supple. No JVD present. No tracheal deviation present. No thyromegaly present.  Cardiovascular: Normal rate, regular rhythm, normal heart sounds and intact distal pulses.   Respiratory: Effort normal and breath sounds normal. No stridor. No respiratory distress. She has no wheezes.  GI: Soft. There is no tenderness. There is no guarding.  Musculoskeletal:       Left hip: She exhibits decreased range of motion, decreased strength, tenderness and bony tenderness. She exhibits no swelling, no deformity and no laceration.  Lymphadenopathy:    She has no cervical adenopathy.  Neurological: She is alert and oriented to person, place, and time.  Skin: Skin is warm and dry.  Psychiatric: She has a normal mood and affect.    Imaging Review Plain radiographs demonstrate severe degenerative joint disease of the left hip(s). The bone quality appears to be good  for age and reported activity level.  Assessment/Plan:  End stage arthritis, left hip(s)  The patient history, physical examination, clinical judgement of the provider and imaging studies are consistent with end stage degenerative joint disease of the left hip(s) and total hip arthroplasty is deemed medically necessary. The treatment options including medical management, injection therapy, arthroscopy and arthroplasty were discussed at length. The risks  and benefits of total hip arthroplasty were presented and reviewed. The risks due to aseptic loosening, infection, stiffness, dislocation/subluxation,  thromboembolic complications and other imponderables were discussed.  The patient acknowledged the explanation, agreed to proceed with the plan and consent was signed. Patient is being admitted for inpatient treatment for surgery, pain control, PT, OT, prophylactic antibiotics, VTE prophylaxis, progressive ambulation and ADL's and discharge planning.The patient is planning to be discharged home with home health services.    Anastasio Auerbach Candus Braud   PAC  04/11/2013, 6:48 PM

## 2013-04-16 DIAGNOSIS — G4733 Obstructive sleep apnea (adult) (pediatric): Secondary | ICD-10-CM | POA: Diagnosis not present

## 2013-04-16 DIAGNOSIS — G8929 Other chronic pain: Secondary | ICD-10-CM | POA: Diagnosis not present

## 2013-04-16 DIAGNOSIS — M159 Polyosteoarthritis, unspecified: Secondary | ICD-10-CM | POA: Diagnosis not present

## 2013-04-16 DIAGNOSIS — E785 Hyperlipidemia, unspecified: Secondary | ICD-10-CM | POA: Diagnosis not present

## 2013-04-17 ENCOUNTER — Encounter (HOSPITAL_COMMUNITY)
Admission: RE | Disposition: A | Discharge status: Home Health | Payer: Self-pay | Source: Ambulatory Visit | Attending: Orthopedic Surgery

## 2013-04-17 ENCOUNTER — Encounter (HOSPITAL_COMMUNITY): Payer: Self-pay | Admitting: Anesthesiology

## 2013-04-17 ENCOUNTER — Inpatient Hospital Stay (HOSPITAL_COMMUNITY)
Admission: RE | Admit: 2013-04-17 | Discharge: 2013-04-18 | DRG: 470 | Disposition: A | Discharge status: Home Health | Payer: Managed Care, Other (non HMO) | Source: Ambulatory Visit | Attending: Orthopedic Surgery | Admitting: Orthopedic Surgery

## 2013-04-17 ENCOUNTER — Inpatient Hospital Stay (HOSPITAL_COMMUNITY): Payer: Managed Care, Other (non HMO)

## 2013-04-17 ENCOUNTER — Inpatient Hospital Stay (HOSPITAL_COMMUNITY): Payer: Managed Care, Other (non HMO) | Admitting: Anesthesiology

## 2013-04-17 ENCOUNTER — Encounter (HOSPITAL_COMMUNITY): Payer: Self-pay | Admitting: *Deleted

## 2013-04-17 DIAGNOSIS — D62 Acute posthemorrhagic anemia: Secondary | ICD-10-CM | POA: Diagnosis not present

## 2013-04-17 DIAGNOSIS — J4489 Other specified chronic obstructive pulmonary disease: Secondary | ICD-10-CM | POA: Diagnosis present

## 2013-04-17 DIAGNOSIS — F329 Major depressive disorder, single episode, unspecified: Secondary | ICD-10-CM | POA: Diagnosis present

## 2013-04-17 DIAGNOSIS — E66811 Obesity, class 1: Secondary | ICD-10-CM | POA: Diagnosis present

## 2013-04-17 DIAGNOSIS — IMO0001 Reserved for inherently not codable concepts without codable children: Secondary | ICD-10-CM | POA: Diagnosis present

## 2013-04-17 DIAGNOSIS — M169 Osteoarthritis of hip, unspecified: Principal | ICD-10-CM | POA: Diagnosis present

## 2013-04-17 DIAGNOSIS — D500 Iron deficiency anemia secondary to blood loss (chronic): Secondary | ICD-10-CM

## 2013-04-17 DIAGNOSIS — M1610 Unilateral primary osteoarthritis, unspecified hip: Principal | ICD-10-CM | POA: Diagnosis present

## 2013-04-17 DIAGNOSIS — E669 Obesity, unspecified: Secondary | ICD-10-CM | POA: Diagnosis present

## 2013-04-17 DIAGNOSIS — Z01812 Encounter for preprocedural laboratory examination: Secondary | ICD-10-CM

## 2013-04-17 DIAGNOSIS — F3289 Other specified depressive episodes: Secondary | ICD-10-CM | POA: Diagnosis present

## 2013-04-17 DIAGNOSIS — F411 Generalized anxiety disorder: Secondary | ICD-10-CM | POA: Diagnosis present

## 2013-04-17 DIAGNOSIS — E663 Overweight: Secondary | ICD-10-CM | POA: Diagnosis present

## 2013-04-17 DIAGNOSIS — Z96649 Presence of unspecified artificial hip joint: Secondary | ICD-10-CM

## 2013-04-17 DIAGNOSIS — J449 Chronic obstructive pulmonary disease, unspecified: Secondary | ICD-10-CM | POA: Diagnosis present

## 2013-04-17 HISTORY — PX: TOTAL HIP ARTHROPLASTY: SHX124

## 2013-04-17 LAB — TYPE AND SCREEN
ABO/RH(D): O POS
Antibody Screen: NEGATIVE

## 2013-04-17 LAB — GLUCOSE, CAPILLARY: Glucose-Capillary: 184 mg/dL — ABNORMAL HIGH (ref 70–99)

## 2013-04-17 SURGERY — ARTHROPLASTY, HIP, TOTAL, ANTERIOR APPROACH
Anesthesia: General | Site: Hip | Laterality: Left | Wound class: Clean

## 2013-04-17 MED ORDER — METHOCARBAMOL 500 MG PO TABS
500.0000 mg | ORAL_TABLET | Freq: Four times a day (QID) | ORAL | Status: DC | PRN
  Administered 2013-04-17: 500 mg via ORAL
  Filled 2013-04-17: qty 1

## 2013-04-17 MED ORDER — POLYETHYLENE GLYCOL 3350 17 G PO PACK: 17.0000 g | PACK | Freq: Every day | ORAL | Status: DC | PRN

## 2013-04-17 MED ORDER — DOCUSATE SODIUM 100 MG PO CAPS
100.0000 mg | ORAL_CAPSULE | Freq: Two times a day (BID) | ORAL | Status: DC
  Administered 2013-04-17 – 2013-04-18 (×2): 100 mg via ORAL

## 2013-04-17 MED ORDER — MENTHOL 3 MG MT LOZG
1.0000 | LOZENGE | OROMUCOSAL | Status: DC | PRN
  Filled 2013-04-17: qty 9

## 2013-04-17 MED ORDER — LORAZEPAM 1 MG PO TABS
1.0000 mg | ORAL_TABLET | Freq: Every evening | ORAL | Status: DC | PRN
  Administered 2013-04-17: 1 mg via ORAL
  Filled 2013-04-17: qty 1

## 2013-04-17 MED ORDER — FENTANYL CITRATE 0.05 MG/ML IJ SOLN
INTRAMUSCULAR | Status: DC | PRN
  Administered 2013-04-17 (×2): 50 ug via INTRAVENOUS
  Administered 2013-04-17: 150 ug via INTRAVENOUS

## 2013-04-17 MED ORDER — ONDANSETRON HCL 4 MG/2ML IJ SOLN
INTRAMUSCULAR | Status: DC | PRN
  Administered 2013-04-17: 4 mg via INTRAVENOUS

## 2013-04-17 MED ORDER — CEFAZOLIN SODIUM-DEXTROSE 2-3 GM-% IV SOLR
2.0000 g | INTRAVENOUS | Status: AC
Start: 2013-04-17 — End: 2013-04-17
  Administered 2013-04-17: 2 g via INTRAVENOUS

## 2013-04-17 MED ORDER — FERROUS SULFATE 325 (65 FE) MG PO TABS
325.0000 mg | ORAL_TABLET | Freq: Three times a day (TID) | ORAL | Status: DC
  Administered 2013-04-17 – 2013-04-18 (×2): 325 mg via ORAL
  Filled 2013-04-17 (×5): qty 1

## 2013-04-17 MED ORDER — HYDROMORPHONE HCL PF 1 MG/ML IJ SOLN
0.2500 mg | INTRAMUSCULAR | Status: DC | PRN
  Administered 2013-04-17: 1 mg via INTRAVENOUS
  Administered 2013-04-17 (×2): 0.5 mg via INTRAVENOUS

## 2013-04-17 MED ORDER — HYDROMORPHONE HCL PF 1 MG/ML IJ SOLN
INTRAMUSCULAR | Status: DC | PRN
  Administered 2013-04-17: 1 mg via INTRAVENOUS
  Administered 2013-04-17 (×2): 0.5 mg via INTRAVENOUS
  Administered 2013-04-17 (×2): 1 mg via INTRAVENOUS

## 2013-04-17 MED ORDER — NEOSTIGMINE METHYLSULFATE 1 MG/ML IJ SOLN
INTRAMUSCULAR | Status: DC | PRN
  Administered 2013-04-17: 4 mg via INTRAVENOUS

## 2013-04-17 MED ORDER — LACTATED RINGERS IV SOLN
INTRAVENOUS | Status: DC
  Administered 2013-04-17: 10:00:00 via INTRAVENOUS
  Administered 2013-04-17: 1000 mL via INTRAVENOUS
  Administered 2013-04-17: 13:00:00 via INTRAVENOUS

## 2013-04-17 MED ORDER — ONDANSETRON HCL 4 MG/2ML IJ SOLN: 4.0000 mg | Freq: Four times a day (QID) | INTRAMUSCULAR | Status: DC | PRN

## 2013-04-17 MED ORDER — KETOROLAC TROMETHAMINE 30 MG/ML IJ SOLN
INTRAMUSCULAR | Status: AC
  Administered 2013-04-17: 15 mg via INTRAVENOUS
  Filled 2013-04-17: qty 1

## 2013-04-17 MED ORDER — BUPROPION HCL ER (XL) 300 MG PO TB24
300.0000 mg | ORAL_TABLET | Freq: Every day | ORAL | Status: DC
  Filled 2013-04-17 (×3): qty 1

## 2013-04-17 MED ORDER — ACETAMINOPHEN 10 MG/ML IV SOLN
INTRAVENOUS | Status: DC | PRN
  Administered 2013-04-17: 1000 mg via INTRAVENOUS

## 2013-04-17 MED ORDER — RIVAROXABAN 10 MG PO TABS
10.0000 mg | ORAL_TABLET | Freq: Every day | ORAL | Status: DC
  Administered 2013-04-18: 10 mg via ORAL
  Filled 2013-04-17 (×2): qty 1

## 2013-04-17 MED ORDER — TRANEXAMIC ACID 100 MG/ML IV SOLN
1000.0000 mg | Freq: Once | INTRAVENOUS | Status: AC
  Administered 2013-04-17: 1000 mg via INTRAVENOUS
  Filled 2013-04-17: qty 10

## 2013-04-17 MED ORDER — OXYCODONE HCL 5 MG PO TABS
5.0000 mg | ORAL_TABLET | ORAL | Status: DC | PRN
  Administered 2013-04-17 – 2013-04-18 (×4): 10 mg via ORAL
  Filled 2013-04-17 (×4): qty 2

## 2013-04-17 MED ORDER — MIDAZOLAM HCL 5 MG/5ML IJ SOLN
INTRAMUSCULAR | Status: DC | PRN
  Administered 2013-04-17: 2 mg via INTRAVENOUS

## 2013-04-17 MED ORDER — KETOROLAC TROMETHAMINE 15 MG/ML IJ SOLN: 15.0000 mg | Freq: Once | INTRAMUSCULAR | Status: DC

## 2013-04-17 MED ORDER — FENTANYL CITRATE 0.05 MG/ML IJ SOLN: 25.0000 ug | INTRAMUSCULAR | Status: DC | PRN

## 2013-04-17 MED ORDER — CEFAZOLIN SODIUM-DEXTROSE 2-3 GM-% IV SOLR
2.0000 g | Freq: Four times a day (QID) | INTRAVENOUS | Status: AC
  Administered 2013-04-17 (×2): 2 g via INTRAVENOUS
  Filled 2013-04-17 (×2): qty 50

## 2013-04-17 MED ORDER — DIAZEPAM 5 MG/ML IJ SOLN
INTRAMUSCULAR | Status: AC
  Filled 2013-04-17: qty 2

## 2013-04-17 MED ORDER — ONDANSETRON HCL 4 MG PO TABS: 4.0000 mg | ORAL_TABLET | Freq: Four times a day (QID) | ORAL | Status: DC | PRN

## 2013-04-17 MED ORDER — ALBUTEROL SULFATE HFA 108 (90 BASE) MCG/ACT IN AERS
2.0000 | INHALATION_SPRAY | Freq: Four times a day (QID) | RESPIRATORY_TRACT | Status: DC | PRN
  Filled 2013-04-17: qty 6.7

## 2013-04-17 MED ORDER — 0.9 % SODIUM CHLORIDE (POUR BTL) OPTIME
TOPICAL | Status: DC | PRN
  Administered 2013-04-17: 1000 mL

## 2013-04-17 MED ORDER — SODIUM CHLORIDE 0.9 % IV SOLN
INTRAVENOUS | Status: DC
  Administered 2013-04-17 – 2013-04-18 (×2): via INTRAVENOUS
  Filled 2013-04-17 (×8): qty 1000

## 2013-04-17 MED ORDER — STERILE WATER FOR IRRIGATION IR SOLN
Status: DC | PRN
  Administered 2013-04-17 (×2): 1500 mL

## 2013-04-17 MED ORDER — LISDEXAMFETAMINE DIMESYLATE 70 MG PO CAPS
70.0000 mg | ORAL_CAPSULE | Freq: Every day | ORAL | Status: DC
  Administered 2013-04-18: 70 mg via ORAL
  Filled 2013-04-17: qty 1

## 2013-04-17 MED ORDER — ACETAMINOPHEN 10 MG/ML IV SOLN
1000.0000 mg | Freq: Three times a day (TID) | INTRAVENOUS | Status: AC
  Administered 2013-04-17 – 2013-04-18 (×3): 1000 mg via INTRAVENOUS
  Filled 2013-04-17 (×4): qty 100

## 2013-04-17 MED ORDER — PROMETHAZINE HCL 25 MG/ML IJ SOLN: 6.2500 mg | INTRAMUSCULAR | Status: DC | PRN

## 2013-04-17 MED ORDER — DULOXETINE HCL 60 MG PO CPEP
60.0000 mg | ORAL_CAPSULE | Freq: Every day | ORAL | Status: DC
  Administered 2013-04-17: 60 mg via ORAL
  Filled 2013-04-17 (×3): qty 1

## 2013-04-17 MED ORDER — DEXAMETHASONE SODIUM PHOSPHATE 10 MG/ML IJ SOLN
10.0000 mg | Freq: Once | INTRAMUSCULAR | Status: AC
  Administered 2013-04-17: 10 mg via INTRAVENOUS

## 2013-04-17 MED ORDER — ESCITALOPRAM OXALATE 20 MG PO TABS
20.0000 mg | ORAL_TABLET | Freq: Every morning | ORAL | Status: DC
  Administered 2013-04-17 – 2013-04-18 (×2): 20 mg via ORAL
  Filled 2013-04-17 (×2): qty 1

## 2013-04-17 MED ORDER — DEXAMETHASONE SODIUM PHOSPHATE 10 MG/ML IJ SOLN
10.0000 mg | Freq: Once | INTRAMUSCULAR | Status: AC
  Administered 2013-04-18: 10 mg via INTRAVENOUS
  Filled 2013-04-17: qty 1

## 2013-04-17 MED ORDER — PHENOL 1.4 % MT LIQD
1.0000 | OROMUCOSAL | Status: DC | PRN
  Filled 2013-04-17: qty 177

## 2013-04-17 MED ORDER — SENNA 8.6 MG PO TABS
1.0000 | ORAL_TABLET | Freq: Two times a day (BID) | ORAL | Status: DC
  Administered 2013-04-17 – 2013-04-18 (×2): 8.6 mg via ORAL
  Filled 2013-04-17 (×2): qty 1

## 2013-04-17 MED ORDER — FENTANYL 75 MCG/HR TD PT72: 75.0000 ug | MEDICATED_PATCH | TRANSDERMAL | Status: DC

## 2013-04-17 MED ORDER — CHLORHEXIDINE GLUCONATE 4 % EX LIQD: 60.0000 mL | Freq: Once | CUTANEOUS | Status: DC

## 2013-04-17 MED ORDER — GLYCOPYRROLATE 0.2 MG/ML IJ SOLN
INTRAMUSCULAR | Status: DC | PRN
  Administered 2013-04-17: 0.6 mg via INTRAVENOUS

## 2013-04-17 MED ORDER — LIDOCAINE HCL (CARDIAC) 20 MG/ML IV SOLN
INTRAVENOUS | Status: DC | PRN
  Administered 2013-04-17: 50 mg via INTRAVENOUS

## 2013-04-17 MED ORDER — PROPOFOL 10 MG/ML IV BOLUS
INTRAVENOUS | Status: DC | PRN
  Administered 2013-04-17: 200 mg via INTRAVENOUS

## 2013-04-17 MED ORDER — DIAZEPAM 5 MG/ML IJ SOLN
5.0000 mg | Freq: Once | INTRAMUSCULAR | Status: AC
  Administered 2013-04-17: 5 mg via INTRAVENOUS

## 2013-04-17 MED ORDER — ALUM & MAG HYDROXIDE-SIMETH 200-200-20 MG/5ML PO SUSP: 30.0000 mL | ORAL | Status: DC | PRN

## 2013-04-17 MED ORDER — KETOROLAC TROMETHAMINE 15 MG/ML IJ SOLN
7.5000 mg | Freq: Four times a day (QID) | INTRAMUSCULAR | Status: DC
  Administered 2013-04-17 – 2013-04-18 (×3): 7.5 mg via INTRAVENOUS
  Filled 2013-04-17 (×4): qty 1

## 2013-04-17 MED ORDER — HYDROMORPHONE HCL PF 1 MG/ML IJ SOLN
0.5000 mg | INTRAMUSCULAR | Status: DC | PRN
  Administered 2013-04-17: 1 mg via INTRAVENOUS
  Filled 2013-04-17: qty 1

## 2013-04-17 MED ORDER — DIPHENHYDRAMINE HCL 12.5 MG/5ML PO ELIX: 25.0000 mg | ORAL_SOLUTION | Freq: Four times a day (QID) | ORAL | Status: DC | PRN

## 2013-04-17 MED ORDER — TIZANIDINE HCL 4 MG PO TABS
4.0000 mg | ORAL_TABLET | Freq: Four times a day (QID) | ORAL | Status: DC
  Administered 2013-04-17: 4 mg via ORAL
  Filled 2013-04-17 (×6): qty 1

## 2013-04-17 MED ORDER — HALOBETASOL PROPIONATE 0.05 % EX CREA: 1.0000 "application " | TOPICAL_CREAM | Freq: Every day | CUTANEOUS | Status: DC | PRN

## 2013-04-17 MED ORDER — ROCURONIUM BROMIDE 100 MG/10ML IV SOLN
INTRAVENOUS | Status: DC | PRN
  Administered 2013-04-17: 50 mg via INTRAVENOUS

## 2013-04-17 SURGICAL SUPPLY — 39 items
ADH SKN CLS APL DERMABOND .7 (GAUZE/BANDAGES/DRESSINGS) ×1
BAG SPEC THK2 15X12 ZIP CLS (MISCELLANEOUS) ×2
BAG ZIPLOCK 12X15 (MISCELLANEOUS) ×4 IMPLANT
BLADE SAW SGTL 18X1.27X75 (BLADE) ×2 IMPLANT
CLOTH BEACON ORANGE TIMEOUT ST (SAFETY) ×2 IMPLANT
DERMABOND ADVANCED (GAUZE/BANDAGES/DRESSINGS) ×1
DERMABOND ADVANCED .7 DNX12 (GAUZE/BANDAGES/DRESSINGS) ×1 IMPLANT
DRAPE C-ARM 42X72 X-RAY (DRAPES) ×2 IMPLANT
DRAPE STERI IOBAN 125X83 (DRAPES) ×2 IMPLANT
DRAPE U-SHAPE 47X51 STRL (DRAPES) ×6 IMPLANT
DRSG AQUACEL AG ADV 3.5X10 (GAUZE/BANDAGES/DRESSINGS) ×2 IMPLANT
DRSG TEGADERM 4X4.75 (GAUZE/BANDAGES/DRESSINGS) ×1 IMPLANT
DURAPREP 26ML APPLICATOR (WOUND CARE) ×2 IMPLANT
ELECT BLADE TIP CTD 4 INCH (ELECTRODE) ×2 IMPLANT
ELECT REM PT RETURN 9FT ADLT (ELECTROSURGICAL) ×2
ELECTRODE REM PT RTRN 9FT ADLT (ELECTROSURGICAL) ×1 IMPLANT
EVACUATOR 1/8 PVC DRAIN (DRAIN) IMPLANT
FACESHIELD LNG OPTICON STERILE (SAFETY) ×8 IMPLANT
GAUZE SPONGE 2X2 8PLY STRL LF (GAUZE/BANDAGES/DRESSINGS) ×1 IMPLANT
GLOVE BIOGEL PI IND STRL 7.5 (GLOVE) ×1 IMPLANT
GLOVE BIOGEL PI IND STRL 8 (GLOVE) ×1 IMPLANT
GLOVE BIOGEL PI INDICATOR 7.5 (GLOVE) ×1
GLOVE BIOGEL PI INDICATOR 8 (GLOVE) ×1
GLOVE ECLIPSE 8.0 STRL XLNG CF (GLOVE) ×2 IMPLANT
GLOVE ORTHO TXT STRL SZ7.5 (GLOVE) ×4 IMPLANT
GOWN BRE IMP PREV XXLGXLNG (GOWN DISPOSABLE) ×2 IMPLANT
GOWN STRL NON-REIN LRG LVL3 (GOWN DISPOSABLE) ×2 IMPLANT
KIT BASIN OR (CUSTOM PROCEDURE TRAY) ×2 IMPLANT
PACK TOTAL JOINT (CUSTOM PROCEDURE TRAY) ×2 IMPLANT
PADDING CAST COTTON 6X4 STRL (CAST SUPPLIES) ×2 IMPLANT
SPONGE GAUZE 2X2 STER 10/PKG (GAUZE/BANDAGES/DRESSINGS) ×1
SUCTION FRAZIER 12FR DISP (SUCTIONS) ×2 IMPLANT
SUT MNCRL AB 4-0 PS2 18 (SUTURE) ×2 IMPLANT
SUT VIC AB 1 CT1 36 (SUTURE) ×8 IMPLANT
SUT VIC AB 2-0 CT1 27 (SUTURE) ×4
SUT VIC AB 2-0 CT1 TAPERPNT 27 (SUTURE) ×2 IMPLANT
SUT VLOC 180 0 24IN GS25 (SUTURE) ×2 IMPLANT
TOWEL OR 17X26 10 PK STRL BLUE (TOWEL DISPOSABLE) ×4 IMPLANT
TRAY FOLEY CATH 14FRSI W/METER (CATHETERS) ×2 IMPLANT

## 2013-04-17 NOTE — Transfer of Care (Signed)
Immediate Anesthesia Transfer of Care Note  Patient: Abigail Huerta  Procedure(s) Performed: Procedure(s): TOTAL HIP ARTHROPLASTY ANTERIOR APPROACH (Left)  Patient Location: PACU  Anesthesia Type:General  Level of Consciousness: awake and alert   Airway & Oxygen Therapy: Patient Spontanous Breathing and Patient connected to face mask oxygen  Post-op Assessment: Report given to PACU RN and Post -op Vital signs reviewed and stable  Post vital signs: Reviewed and stable  Complications: No apparent anesthesia complications

## 2013-04-17 NOTE — Anesthesia Postprocedure Evaluation (Addendum)
  Anesthesia Post-op Note  Patient: Abigail Huerta  Procedure(s) Performed: Procedure(s) (LRB): TOTAL HIP ARTHROPLASTY ANTERIOR APPROACH (Left)  Patient Location: PACU  Anesthesia Type: General  Level of Consciousness: awake and alert   Airway and Oxygen Therapy: Patient Spontanous Breathing  Post-op Pain: mild  Post-op Assessment: Post-op Vital signs reviewed, Patient's Cardiovascular Status Stable, Respiratory Function Stable, Patent Airway and No signs of Nausea or vomiting  Last Vitals:  Filed Vitals:   04/17/13 1245  BP: 175/81  Pulse: 65  Temp:   Resp: 14    Post-op Vital Signs: stable   Complications: No apparent anesthesia complications. To floor on OSA precautions including pulse oximetry and ET CO2 monitoring.

## 2013-04-17 NOTE — Progress Notes (Signed)
Placed pt. On cpap. Pt.is tolerating well at this time. 

## 2013-04-17 NOTE — Anesthesia Preprocedure Evaluation (Addendum)
Anesthesia Evaluation  Patient identified by MRN, date of birth, ID band Patient awake    Reviewed: Allergy & Precautions, H&P , NPO status , Patient's Chart, lab work & pertinent test results  Airway Mallampati: II TM Distance: >3 FB Neck ROM: Full    Dental  (+) Dental Advisory Given, Poor Dentition, Loose, Chipped and Missing   Pulmonary sleep apnea , COPD COPD inhaler,  breath sounds clear to auscultation  Pulmonary exam normal       Cardiovascular negative cardio ROS  Rhythm:Regular Rate:Normal     Neuro/Psych PSYCHIATRIC DISORDERS Anxiety Depression  Neuromuscular disease    GI/Hepatic negative GI ROS, Neg liver ROS,   Endo/Other  negative endocrine ROS  Renal/GU negative Renal ROS  negative genitourinary   Musculoskeletal  (+) Fibromyalgia -, narcotic dependent  Abdominal (+) + obese,   Peds negative pediatric ROS (+)  Hematology negative hematology ROS (+)   Anesthesia Other Findings   Reproductive/Obstetrics negative OB ROS                          Anesthesia Physical Anesthesia Plan  ASA: II  Anesthesia Plan: General   Post-op Pain Management:    Induction: Intravenous  Airway Management Planned: Oral ETT  Additional Equipment:   Intra-op Plan:   Post-operative Plan: Extubation in OR  Informed Consent: I have reviewed the patients History and Physical, chart, labs and discussed the procedure including the risks, benefits and alternatives for the proposed anesthesia with the patient or authorized representative who has indicated his/her understanding and acceptance.   Dental advisory given  Plan Discussed with: CRNA  Anesthesia Plan Comments: (Discussed general versus spinal. Patient had a general for other hip THA and prefers general today.)       Anesthesia Quick Evaluation

## 2013-04-17 NOTE — Op Note (Signed)
NAME:  Abigail Huerta                ACCOUNT NO.: [id number]      MEDICAL RECORD NO.: [id number]      FACILITY:  [facility]      PHYSICIAN:  Durene Romans D  DATE OF BIRTH:  January 09, 1956     DATE OF PROCEDURE:  04/17/2013                                 OPERATIVE REPORT         PREOPERATIVE DIAGNOSIS: Left  hip osteoarthritis.      POSTOPERATIVE DIAGNOSIS:  Left hip osteoarthritis.      PROCEDURE:  Left total hip replacement through an anterior approach   utilizing DePuy THR system, component size 52mm pinnacle cup, a size 36+4 neutral   Altrex liner, a size 9 Hi Tri Lock stem with a 36+1.5 delta ceramic   ball.      SURGEON:  Madlyn Frankel. Charlann Boxer, M.D.      ASSISTANT:  Leilani Able, PA-C      ANESTHESIA:  General.      SPECIMENS:  None.      COMPLICATIONS:  None.      BLOOD LOSS:  600 cc     DRAINS:  One Hemovac.      INDICATION OF THE PROCEDURE:  Abigail Huerta is a 57 y.o. female who had   presented to office for evaluation of left hip pain.  Radiographs revealed   progressive degenerative changes with bone-on-bone   articulation to the  hip joint.  The patient had painful limited range of   motion significantly affecting their overall quality of life.  The patient was failing to    respond to conservative measures, and at this point was ready   to proceed with more definitive measures.  The patient has noted progressive   degenerative changes in his hip, progressive problems and dysfunction   with regarding the hip prior to surgery.  Consent was obtained for   benefit of pain relief.  Specific risk of infection, DVT, component   failure, dislocation, need for revision surgery, as well discussion of   the anterior versus posterior approach were reviewed.  Consent was   obtained for benefit of anterior pain relief through an anterior   approach.      PROCEDURE IN DETAIL:  The patient was brought to operative theater.   Once adequate anesthesia,  preoperative antibiotics, 2gm Ancef administered.   The patient was positioned supine on the OSI Hanna table.  Once adequate   padding of boney process was carried out, we had predraped out the hip, and  used fluoroscopy to confirm orientation of the pelvis and position.      The left hip was then prepped and draped from proximal iliac crest to   mid thigh with shower curtain technique.      Time-out was performed identifying the patient, planned procedure, and   extremity.     An incision was then made 2 cm distal and lateral to the   anterior superior iliac spine extending over the orientation of the   tensor fascia lata muscle and sharp dissection was carried down to the   fascia of the muscle and protractor placed in the soft tissues.      The fascia was then incised.  The muscle belly was identified and swept  laterally and retractor placed along the superior neck.  Following   cauterization of the circumflex vessels and removing some pericapsular   fat, a second cobra retractor was placed on the inferior neck.  A third   retractor was placed on the anterior acetabulum after elevating the   anterior rectus.  A L-capsulotomy was along the line of the   superior neck to the trochanteric fossa, then extended proximally and   distally.  Tag sutures were placed and the retractors were then placed   intracapsular.  We then identified the trochanteric fossa and   orientation of my neck cut, confirmed this radiographically   and then made a neck osteotomy with the femur on traction.  The femoral   head was removed without difficulty or complication.  Traction was let   off and retractors were placed posterior and anterior around the   acetabulum.      The labrum and foveal tissue were debrided.  I began reaming with a 49mm   reamer and reamed up to 51mm reamer with good bony bed preparation and a 52   cup was chosen.  The final 52mm Pinnacle cup was then impacted under fluoroscopy  to  confirm the depth of penetration and orientation with respect to   abduction.  A screw was placed followed by the hole eliminator.  The final   36+4 neutral Altrex liner was impacted with good visualized rim fit.  The cup was positioned anatomically within the acetabular portion of the pelvis.      At this point, the femur was rolled at 80 degrees.  Further capsule was   released off the inferior aspect of the femoral neck.  I then   released the superior capsule proximally.  The hook was placed laterally   along the femur and elevated manually and held in position with the bed   hook.  The leg was then extended and adducted with the leg rolled to 100   degrees of external rotation.  Once the proximal femur was fully   exposed, I used a box osteotome to set orientation.  I then began   broaching with the starting chili pepper broach and passed this by hand and then broached up to 9.  With the 9 broach in place I chose a high offset neck and did a trial reduction with the 36+1.5 ball.  The offset was appropriate, leg lengths   appeared to be equal, confirmed radiographically.   Given these findings, I went ahead and dislocated the hip, repositioned all   retractors and positioned the right hip in the extended and abducted position.  The final 9 Hi Tri Lock stem was   chosen and it was impacted down to the level of neck cut.  Based on this   and the trial reduction, a 36+1.5 delta ceramic ball was chosen and   impacted onto a clean and dry trunnion, and the hip was reduced.  The   hip had been irrigated throughout the case again at this point.  I did   reapproximate the superior capsular leaflet to the anterior leaflet   using #1 Vicryl, placed a medium Hemovac drain deep.  The fascia of the   tensor fascia lata muscle was then reapproximated using #1 Vicryl.  The   remaining wound was closed with 2-0 Vicryl and running 4-0 Monocryl.   The hip was cleaned, dried, and dressed sterilely using  Dermabond and   Aquacel dressing.  Drain site dressed  separately.  She was then brought   to recovery room in stable condition tolerating the procedure well.    Leilani Able, PA-C was present for the entirety of the case involved from   preoperative positioning, perioperative retractor management, general   facilitation of the case, as well as primary wound closure as assistant.            Madlyn Frankel Charlann Boxer, M.D.            MDO/MEDQ  D:  10/19/2011  T:  10/19/2011  Job:  [id number]      Electronically Signed by Durene Romans M.D. on 10/25/2011 09:15:38 AM

## 2013-04-17 NOTE — Progress Notes (Signed)
Patient states she took Cipro for UTI

## 2013-04-17 NOTE — Interval H&P Note (Signed)
History and Physical Interval Note:  04/17/2013 8:41 AM  Abigail Huerta  has presented today for surgery, with the diagnosis of LEFT HIP OA  The various methods of treatment have been discussed with the patient and family. After consideration of risks, benefits and other options for treatment, the patient has consented to  Procedure(s): TOTAL HIP ARTHROPLASTY ANTERIOR APPROACH (Left) as a surgical intervention .  The patient's history has been reviewed, patient examined, no change in status, stable for surgery.  I have reviewed the patient's chart and labs.  Questions were answered to the patient's satisfaction.     Shelda Pal

## 2013-04-18 ENCOUNTER — Encounter (HOSPITAL_COMMUNITY): Payer: Self-pay | Admitting: Orthopedic Surgery

## 2013-04-18 DIAGNOSIS — D500 Iron deficiency anemia secondary to blood loss (chronic): Secondary | ICD-10-CM

## 2013-04-18 DIAGNOSIS — E66811 Obesity, class 1: Secondary | ICD-10-CM | POA: Diagnosis present

## 2013-04-18 DIAGNOSIS — E669 Obesity, unspecified: Secondary | ICD-10-CM | POA: Diagnosis present

## 2013-04-18 DIAGNOSIS — Z96649 Presence of unspecified artificial hip joint: Secondary | ICD-10-CM

## 2013-04-18 DIAGNOSIS — E663 Overweight: Secondary | ICD-10-CM | POA: Diagnosis present

## 2013-04-18 LAB — CBC
HCT: 24.3 % — ABNORMAL LOW (ref 36.0–46.0)
MCV: 81 fL (ref 78.0–100.0)
Platelets: 193 10*3/uL (ref 150–400)
RBC: 3 MIL/uL — ABNORMAL LOW (ref 3.87–5.11)
RDW: 13 % (ref 11.5–15.5)
WBC: 15.2 10*3/uL — ABNORMAL HIGH (ref 4.0–10.5)

## 2013-04-18 LAB — BASIC METABOLIC PANEL
CO2: 27 mEq/L (ref 19–32)
Chloride: 102 mEq/L (ref 96–112)
Creatinine, Ser: 0.94 mg/dL (ref 0.50–1.10)
GFR calc Af Amer: 75 mL/min — ABNORMAL LOW (ref >90)
Potassium: 4.9 mEq/L (ref 3.5–5.1)

## 2013-04-18 MED ORDER — FERROUS SULFATE 325 (65 FE) MG PO TABS: 325.0000 mg | ORAL_TABLET | Freq: Three times a day (TID) | ORAL | Status: DC

## 2013-04-18 MED ORDER — OXYCODONE HCL 5 MG PO TABS: 5.0000 mg | ORAL_TABLET | ORAL | Status: DC | PRN

## 2013-04-18 MED ORDER — ASPIRIN EC 325 MG PO TBEC: 325.0000 mg | DELAYED_RELEASE_TABLET | Freq: Two times a day (BID) | ORAL | Status: DC

## 2013-04-18 MED ORDER — CALCIUM CARBONATE ANTACID 500 MG PO CHEW
1.0000 | CHEWABLE_TABLET | Freq: Two times a day (BID) | ORAL | Status: DC | PRN
  Administered 2013-04-18: 400 mg via ORAL
  Filled 2013-04-18: qty 2

## 2013-04-18 MED ORDER — POLYETHYLENE GLYCOL 3350 17 G PO PACK: 17.0000 g | PACK | Freq: Every day | ORAL | Status: DC | PRN

## 2013-04-18 MED ORDER — DSS 100 MG PO CAPS: 100.0000 mg | ORAL_CAPSULE | Freq: Two times a day (BID) | ORAL | Status: DC

## 2013-04-18 NOTE — Evaluation (Signed)
Occupational Therapy Evaluation Patient Details Name: Abigail Huerta MRN: [id number] DOB: 1956/07/23 Today's Date: 04/18/2013 Time: 1478-2956 OT Time Calculation (min): 18 min  OT Assessment / Plan / Recommendation Clinical Impression  Pt is a 57 yo female admitted for THA who is extremely impulsive and unsafe during adls and adl transfers.  Pt would benefit from cont OT to increase I and safety with all adls.    OT Assessment  Patient needs continued OT Services    Follow Up Recommendations  Home health OT    Barriers to Discharge None    Equipment Recommendations  None recommended by OT    Recommendations for Other Services    Frequency  Min 2X/week    Precautions / Restrictions Precautions Precautions: Fall Precaution Comments: Pt very impulsive Restrictions Weight Bearing Restrictions: No   Pertinent Vitals/Pain Pt c/o 4/10 pain only when getting into bed.      ADL  Eating/Feeding: Performed;Independent Where Assessed - Eating/Feeding: Chair Grooming: Performed;Wash/dry hands;Wash/dry face;Min guard;Other (comment) (pt unsafe anytime she was in standing.) Where Assessed - Grooming: Supported standing Upper Body Bathing: Simulated;Set up Where Assessed - Upper Body Bathing: Unsupported sitting Lower Body Bathing: Simulated;Minimal assistance Where Assessed - Lower Body Bathing: Supported sit to stand Upper Body Dressing: Simulated;Set up Where Assessed - Upper Body Dressing: Unsupported sitting Lower Body Dressing: Performed;Minimal assistance Where Assessed - Lower Body Dressing: Supported sit to stand Toilet Transfer: Performed;Min guard Toilet Transfer Method: Other (comment);Sit to stand (ambulated to br) Toilet Transfer Equipment: Raised toilet seat with arms (or 3-in-1 over toilet) Toileting - Clothing Manipulation and Hygiene: Performed;Min guard Where Assessed - Toileting Clothing Manipulation and Hygiene: Sit to stand from 3-in-1 or toilet Tub/Shower  Transfer: Simulated;Moderate assistance Tub/Shower Transfer Method: Science writer: Other (comment) (step over tub transfer.) Equipment Used: Rolling walker Transfers/Ambulation Related to ADLs: Pt very unsafe each and every time she wa on her feet.  At times, pt does not use walker at all, at times she holds on with one hand and most of the time pt does not keep walker in front of her when turning to sit down on commode or chair. ADL Comments: pt unsafe attempting to do adls in standing. Pt attempted to step over side of tub (simulated) and lost balance.  Therapist assisted pt with balance but pt would not stop to get her balance back before attempting to do the task again.  Pt moves very quickly and is very impulsive.    OT Diagnosis: Generalized weakness;Acute pain  OT Problem List: Decreased strength;Impaired balance (sitting and/or standing);Decreased safety awareness;Decreased knowledge of use of DME or AE;Pain OT Treatment Interventions: Self-care/ADL training;Therapeutic activities   OT Goals Acute Rehab OT Goals OT Goal Formulation: With patient/family Time For Goal Achievement: 04/25/13 Potential to Achieve Goals: Fair ADL Goals Pt Will Perform Grooming: with modified independence;Standing at sink ADL Goal: Grooming - Progress: Goal set today Pt Will Perform Lower Body Bathing: with supervision;Sit to stand in shower ADL Goal: Lower Body Bathing - Progress: Goal set today Pt Will Perform Lower Body Dressing: with supervision;Sit to stand from chair ADL Goal: Lower Body Dressing - Progress: Goal set today Pt Will Perform Tub/Shower Transfer: Tub transfer;with supervision ADL Goal: Tub/Shower Transfer - Progress: Goal set today Additional ADL Goal #1: Pt will complete all aspects of toileting with S on 3:1 over commode. ADL Goal: Additional Goal #1 - Progress: Goal set today  Visit Information  Last OT Received On: 04/18/13 Assistance Needed: +1  Subjective Data  Subjective: "I just want to get out of here." Patient Stated Goal: to go home.   Prior Functioning     Home Living Lives With: Spouse Available Help at Discharge: Family;Available 24 hours/day Type of Home: House Home Access: Ramped entrance Home Layout: Able to live on main level with bedroom/bathroom Bathroom Shower/Tub: Tub/shower unit;Curtain Bathroom Toilet: Standard Home Adaptive Equipment: Walker - rolling;Bedside commode/3-in-1 Additional Comments: Pt first stated she had no equipment and when equipment was being explained she said she had all of it. Prior Function Level of Independence: Independent Able to Take Stairs?: Yes Driving: Yes Communication Communication: No difficulties Dominant Hand: Right         Vision/Perception Vision - History Baseline Vision: No visual deficits Patient Visual Report: No change from baseline Vision - Assessment Vision Assessment: Vision not tested   Cognition  Cognition Arousal/Alertness: Awake/alert Behavior During Therapy: Anxious Overall Cognitive Status: Within Functional Limits for tasks assessed (extremely impulsive.  FAmily states this is not new.)    Extremity/Trunk Assessment Right Upper Extremity Assessment RUE ROM/Strength/Tone: WFL for tasks assessed RUE Sensation: WFL - Light Touch RUE Coordination: WFL - gross/fine motor Left Upper Extremity Assessment LUE ROM/Strength/Tone: WFL for tasks assessed LUE Sensation: WFL - Light Touch LUE Coordination: WFL - gross/fine motor Right Lower Extremity Assessment RLE ROM/Strength/Tone: WFL for tasks assessed Left Lower Extremity Assessment LLE ROM/Strength/Tone: Deficits LLE ROM/Strength/Tone Deficits: hip strength 3-/5 with AAROM to 75 flex and 20 abd Trunk Assessment Trunk Assessment: Normal     Mobility Bed Mobility Bed Mobility: Sit to Supine Supine to Sit: 4: Min assist Sit to Supine: 4: Min assist Details for Bed Mobility Assistance:  min assist with L LE Transfers Transfers: Sit to Stand;Stand to Sit Sit to Stand: 4: Min guard;From elevated surface Stand to Sit: 4: Min guard;With armrests;To chair/3-in-1 Details for Transfer Assistance: cues for LE management, use of UEs to self assist and basic saftey awareness to prevent falls     Exercise Total Joint Exercises Ankle Circles/Pumps: AROM;15 reps;Supine;Both Quad Sets: AROM;10 reps;Both;Supine Heel Slides: AAROM;Supine;Left;15 reps Hip ABduction/ADduction: AAROM;15 reps;Supine;Left   Balance High Level Balance High Level Balance Activites: Other (comment) (shower transfer) High Level Balance Comments: Pt very unsafe doing simulated shower transfer.  Pt so anxious she was attempting to do it the way she thought was safe instead of taking instruction from therapist.  Will defer tub transfers to [facility].   End of Session OT - End of Session Activity Tolerance: Patient tolerated treatment well Patient left: in bed;with call bell/phone within reach;with family/visitor present Nurse Communication: Mobility status  GO     Gordon, Carlson 04/18/2013, 12:59 PM [phone]

## 2013-04-18 NOTE — Care Management Note (Signed)
    Page 1 of 1   04/18/2013     6:44:36 PM   CARE MANAGEMENT NOTE 04/18/2013  Patient:  Abigail Huerta, Abigail Huerta   Account Number:  [id number]  Date Initiated:  04/18/2013  Documentation initiated by:  Colleen Can  Subjective/Objective Assessment:   dx left hip osteoarthritis; total hip replacemnt.  Pre-arranged with Genevieve Norlander who will provide [facility] services and arrange for dme.     Action/Plan:   CM spoke with patient and spouse. Plans are for patient to return to her home where spouse will be caregiver.   Anticipated DC Date:  04/18/2013   Anticipated DC Plan:  HOME W HOME HEALTH SERVICES      DC Planning Services  CM consult      [facility] Choice  HOME HEALTH  DURABLE MEDICAL EQUIPMENT   Choice offered to / List presented to:  C-1 Patient        HH arranged  HH-2 PT      [facility] agency  [facility]   Status of service:  Completed, signed off Medicare Important Message given?   (If response is "NO", the following Medicare IM given date fields will be blank) Date Medicare IM given:   Date Additional Medicare IM given:    Discharge Disposition:  HOME W HOME HEALTH SERVICES  Per UR Regulation:    If discussed at Long Length of Stay Meetings, dates discussed:    Comments:

## 2013-04-18 NOTE — Progress Notes (Signed)
   Subjective: 1 Day Post-Op Procedure(s) (LRB): TOTAL HIP ARTHROPLASTY ANTERIOR APPROACH (Left)   Patient reports pain as mild, pain well controlled. She appears to be slightly out of it this morning and difficulty stay awake for long periods of time. States that she fell ready to go home.   Objective:   VITALS:   Filed Vitals:   04/18/13 0800  BP: 91/55  Pulse: 87  Temp: 97.9 F (36.6 C)  Resp: 14    Neurovascular intact Dorsiflexion/Plantar flexion intact Incision: dressing C/D/I No cellulitis present Compartment soft  LABS  Recent Labs  04/18/13 0424  HGB 8.4*  HCT 24.3*  WBC 15.2*  PLT 193     Recent Labs  04/18/13 0424  NA 135  K 4.9  BUN 12  CREATININE 0.94  GLUCOSE 166*     Assessment/Plan: 1 Day Post-Op Procedure(s) (LRB): TOTAL HIP ARTHROPLASTY ANTERIOR APPROACH (Left) HV drain d/c'ed and dressing changed Foley cath d/c'ed Advance diet Up with therapy D/C IV fluids Discharge home with home health, if she wakes more, does well with PT and pain well controlled.  Follow up in 2 weeks at [facility] Follow up with OLIN,Lagena Strand D in 2 weeks.  Contact information:  [facility] [address], [address] [place] [address] [phone]    Expected ABLA  Treated with iron and will observe  Obese (BMI 30-39.9) Estimated body mass index is 31.79 kg/(m^2) as calculated from the following:   Height as of this encounter: 5\' 7"  (1.702 m).   Weight as of this encounter: 92.08 kg (203 lb). Patient also counseled that weight may inhibit the healing process Patient counseled that losing weight will help with future health issues        Anastasio Auerbach. Elisheva Fallas   PAC  04/18/2013, 9:17 AM

## 2013-04-18 NOTE — Evaluation (Signed)
Physical Therapy Evaluation Patient Details Name: Abigail Huerta MRN: [id number] DOB: [birth year] Today's Date: 04/18/2013 Time: 1191-4782 PT Time Calculation (min): 21 min  PT Assessment / Plan / Recommendation Clinical Impression  Pt s/p L THR presents with decreased L LE strength/ROM, post op pain and demonstrated impulsiveness with questionable saftey awareness limiting functional mobility    PT Assessment  Patient needs continued PT services    Follow Up Recommendations  Home health PT    Does the patient have the potential to tolerate intense rehabilitation      Barriers to Discharge None      Equipment Recommendations  None recommended by PT    Recommendations for Other Services OT consult   Frequency 7X/week    Precautions / Restrictions Precautions Precautions: Fall Precaution Comments: Pt very impulsive Restrictions Weight Bearing Restrictions: No   Pertinent Vitals/Pain       Mobility  Bed Mobility Bed Mobility: Supine to Sit Supine to Sit: 4: Min assist Details for Bed Mobility Assistance: min assist with L LE Transfers Transfers: Sit to Stand;Stand to Sit Sit to Stand: 4: Min assist;From elevated surface;From bed Stand to Sit: 4: Min assist;To chair/3-in-1;With upper extremity assist Details for Transfer Assistance: cues for LE management, use of UEs to self assist and basic saftey awareness to prevent falls Ambulation/Gait Ambulation/Gait Assistance: 4: Min assist Ambulation Distance (Feet): 20 Feet Assistive device: Rolling walker Ambulation/Gait Assistance Details: cues for posture, sequence, position from RW, ER on L and saftey awareness to prevent falls Gait Pattern: Step-to pattern;Decreased step length - right;Decreased step length - left;Festinating;Trunk flexed    Exercises Total Joint Exercises Ankle Circles/Pumps: AROM;15 reps;Supine;Both Quad Sets: AROM;10 reps;Both;Supine Heel Slides: AAROM;Supine;Left;15 reps Hip  ABduction/ADduction: AAROM;15 reps;Supine;Left   PT Diagnosis: Difficulty walking  PT Problem List: Decreased strength;Decreased range of motion;Decreased activity tolerance;Decreased mobility;Decreased knowledge of use of DME;Decreased safety awareness;Pain PT Treatment Interventions: DME instruction;Gait training;Functional mobility training;Therapeutic exercise;Therapeutic activities;Patient/family education   PT Goals Acute Rehab PT Goals PT Goal Formulation: With patient Time For Goal Achievement: 04/25/13 Potential to Achieve Goals: Good Pt will go Supine/Side to Sit: with supervision PT Goal: Supine/Side to Sit - Progress: Goal set today Pt will go Sit to Supine/Side: with supervision PT Goal: Sit to Supine/Side - Progress: Goal set today Pt will go Sit to Stand: with supervision PT Goal: Sit to Stand - Progress: Goal set today Pt will go Stand to Sit: with supervision PT Goal: Stand to Sit - Progress: Goal set today Pt will Ambulate: 51 - 150 feet;with supervision;with rolling walker PT Goal: Ambulate - Progress: Goal set today  Visit Information  Last PT Received On: 04/18/13 Assistance Needed: +1    Subjective Data  Subjective: I know all this, I had the other one done, I have back problems too you know Patient Stated Goal: Home   Prior Functioning  Home Living Lives With: Spouse Available Help at Discharge: Family Type of Home: House Home Access: Ramped entrance Home Layout: Able to live on main level with bedroom/bathroom Home Adaptive Equipment: Walker - rolling Prior Function Level of Independence: Independent Able to Take Stairs?: Yes Communication Communication: No difficulties    Cognition  Cognition Arousal/Alertness: Suspect due to medications Behavior During Therapy: WFL for tasks assessed/performed Overall Cognitive Status: Within Functional Limits for tasks assessed (Very impulsive)    Extremity/Trunk Assessment Right Upper Extremity  Assessment RUE ROM/Strength/Tone: [facility] for tasks assessed Left Upper Extremity Assessment LUE ROM/Strength/Tone: [facility] for tasks assessed Right Lower Extremity Assessment RLE ROM/Strength/Tone:  WFL for tasks assessed Left Lower Extremity Assessment LLE ROM/Strength/Tone: Deficits LLE ROM/Strength/Tone Deficits: hip strength 3-/5 with AAROM to 75 flex and 20 abd Trunk Assessment Trunk Assessment: Normal   Balance    End of Session PT - End of Session Equipment Utilized During Treatment: Gait belt Activity Tolerance: Other (comment) (pt ltd by sudden insistance on putting on "unmentionables") Patient left: in chair;with call bell/phone within reach;with family/visitor present Nurse Communication: Mobility status;Other (comment) (Pt may need assist with garments )  GP     Jaiden Dinkins 04/18/2013, 12:13 PM

## 2013-04-19 NOTE — Progress Notes (Signed)
Discharge summary sent to payer through [employer]  

## 2013-04-22 NOTE — Discharge Summary (Signed)
Physician Discharge Summary  Patient ID: Abigail Huerta MRN: [id number] DOB/AGE: October 14, 1956 57 y.o.  Admit date: 04/17/2013 Discharge date: 04/18/2013   Procedures:  Procedure(s) (LRB): TOTAL HIP ARTHROPLASTY ANTERIOR APPROACH (Left)  Attending Physician:  Dr. Durene Romans   Admission Diagnoses:   Left hip OA / pain  Discharge Diagnoses:  Principal Problem:   S/P left THA, AA Active Problems:   Expected blood loss anemia   Obese  Past Medical History  Diagnosis Date  . Depression   . Anxiety   . Fibromyalgia   . Arthritis   . Sleep apnea     cpap setting at 9    HPI:   Abigail Huerta, 57 y.o. female, has a history of pain and functional disability in the left hip(s) due to arthritis and patient has failed non-surgical conservative treatments for greater than 12 weeks to include NSAID's and/or analgesics, use of assistive devices and activity modification. Onset of symptoms was gradual starting 1 years ago with rapidlly worsening course since that time.The patient noted no past surgery on the left hip(s). Patient currently rates pain in the left hip at 10 out of 10 with activity. Patient has worsening of pain with activity and weight bearing, trendelenberg gait, pain that interfers with activities of daily living and pain with passive range of motion. Patient has evidence of periarticular osteophytes and joint space narrowing by imaging studies. This condition presents safety issues increasing the risk of falls. There is no current active infection. Risks, benefits and expectations were discussed with the patient. Patient understand the risks, benefits and expectations and wishes to proceed with surgery.   PCP: No primary provider on file.   Discharged Condition: good  Hospital Course:  Patient underwent the above stated procedure on 04/17/2013. Patient tolerated the procedure well and brought to the recovery room in good condition and subsequently to the floor.  POD #1 BP:  91/55 ; Pulse: 87 ; Temp: 97.9 F (36.6 C) ; Resp: 14  Pt's foley was removed, as well as the hemovac drain removed. IV was changed to a saline lock. Patient reports pain as mild, pain well controlled. She appears to be slightly out of it this morning and difficulty stay awake for long periods of time. States that she fell ready to go home.  Throughout the day her condition improved and she was eventually  Neurovascular intact, dorsiflexion/plantar flexion intact, incision: dressing C/D/I, no cellulitis present and compartment soft.   LABS  Basename  04/18/13    0424   HGB  8.4  HCT  24.3    Discharge Exam: General appearance: alert, cooperative and no distress Extremities: Homans sign is negative, no sign of DVT, no edema, redness or tenderness in the calves or thighs and no ulcers, gangrene or trophic changes  Disposition:   Home with follow up in 2 weeks   Follow-up Information   Follow up with Shelda Pal, MD. Schedule an appointment as soon as possible for a visit in 2 weeks.   Contact information:   [address] [place] [address] [phone]       Discharge Orders   Future Orders Complete By Expires     Call MD / Call 911  As directed     Comments:      If you experience chest pain or shortness of breath, CALL 911 and be transported to the hospital emergency room.  If you develope a fever above 101 F, pus (white drainage) or increased drainage  or redness at the wound, or calf pain, call your surgeon's office.    Change dressing  As directed     Comments:      Maintain surgical dressing for 10-14 days, then replace with 4x4 guaze and tape. Keep the area dry and clean.    Constipation Prevention  As directed     Comments:      Drink plenty of fluids.  Prune juice may be helpful.  You may use a stool softener, such as Colace (over the counter) 100 mg twice a day.  Use MiraLax (over the counter) for constipation as needed.    Diet - low sodium heart  healthy  As directed     Discharge instructions  As directed     Comments:      Maintain surgical dressing for 10-14 days, then replace with gauze and tape. Keep the area dry and clean until follow up. Follow up in 2 weeks at [facility]. Call with any questions or concerns.    Increase activity slowly as tolerated  As directed     TED hose  As directed     Comments:      Use stockings (TED hose) for 2 weeks on both leg(s).  You may remove them at night for sleeping.    Weight bearing as tolerated  As directed          Medication List    TAKE these medications       albuterol 108 (90 BASE) MCG/ACT inhaler  Commonly known as:  PROVENTIL HFA;VENTOLIN HFA  Inhale 2 puffs into the lungs every 6 (six) hours as needed for shortness of breath.     aspirin EC 325 MG tablet  Take 1 tablet (325 mg total) by mouth 2 (two) times daily.     buPROPion 300 MG 24 hr tablet  Commonly known as:  WELLBUTRIN XL  Take 300 mg by mouth at bedtime.     diphenhydrAMINE 25 mg capsule  Commonly known as:  BENADRYL  Take 25-50 mg by mouth daily as needed for allergies.     DSS 100 MG Caps  Take 100 mg by mouth 2 (two) times daily.     DULoxetine 60 MG capsule  Commonly known as:  CYMBALTA  Take 60 mg by mouth at bedtime. For pain     escitalopram 20 MG tablet  Commonly known as:  LEXAPRO  Take 20 mg by mouth every morning.     fentaNYL 75 MCG/HR  Commonly known as:  DURAGESIC - dosed mcg/hr  Place 1 patch onto the skin every other day.     ferrous sulfate 325 (65 FE) MG tablet  Take 1 tablet (325 mg total) by mouth 3 (three) times daily after meals.     halobetasol 0.05 % cream  Commonly known as:  ULTRAVATE  Apply 1 application topically daily as needed (for blisters).     lisdexamfetamine 70 MG capsule  Commonly known as:  VYVANSE  Take 70 mg by mouth every morning.     LORazepam 1 MG tablet  Commonly known as:  ATIVAN  Take 1 mg by mouth at bedtime as needed for  anxiety.     oxyCODONE 5 MG immediate release tablet  Commonly known as:  Oxy IR/ROXICODONE  Take 1-3 tablets (5-15 mg total) by mouth every 4 (four) hours as needed for pain.     polyethylene glycol packet  Commonly known as:  MIRALAX / GLYCOLAX  Take 17 g by  mouth daily as needed.     promethazine 25 MG tablet  Commonly known as:  PHENERGAN  Take 25 mg by mouth every 6 (six) hours as needed for nausea.     tiZANidine 4 MG tablet  Commonly known as:  ZANAFLEX  Take 4 mg by mouth 4 (four) times daily.         Signed: Anastasio Auerbach. Asher Torpey   PAC  04/22/2013, 7:06 PM

## 2013-05-20 IMAGING — CR DG PORTABLE PELVIS
1 series · 1 of 1 positions shown · non-contrast
Comparison: None.

CLINICAL DATA: Left hip osteoarthritis

PORTABLE PELVIS

[AP]
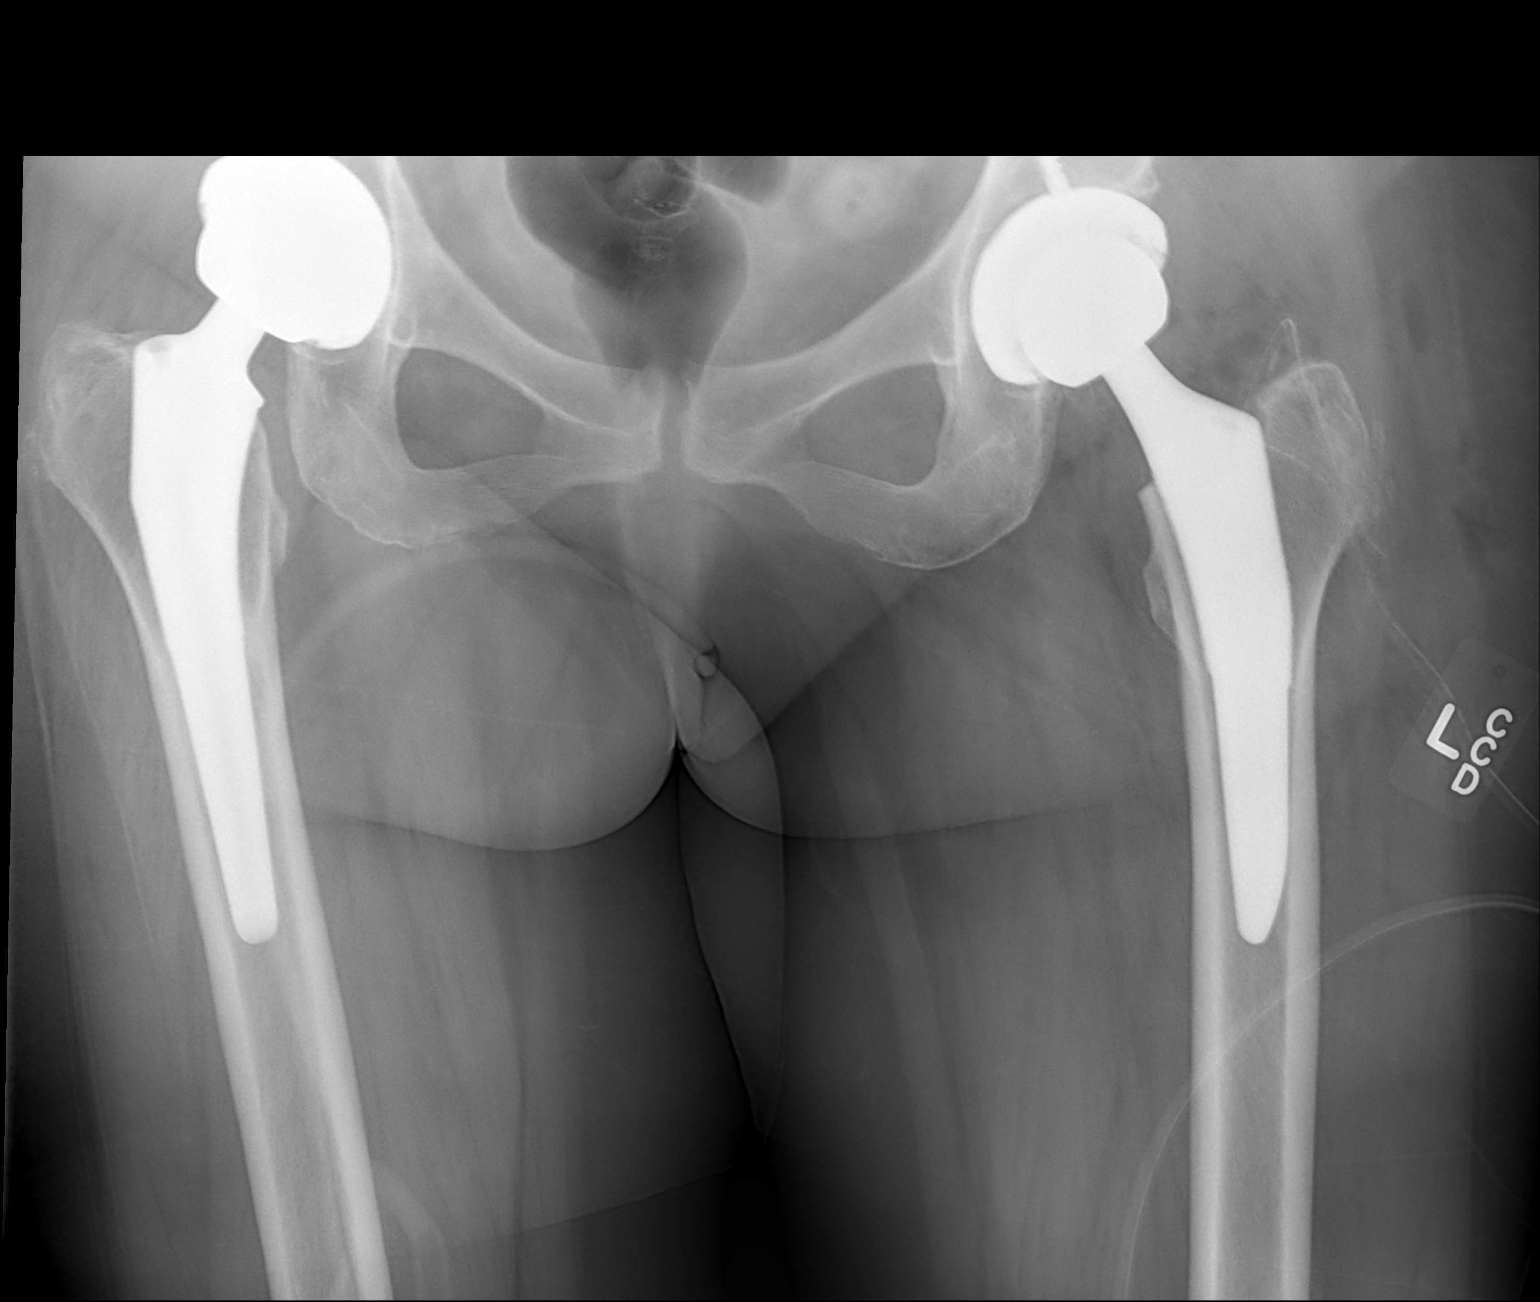

[1 of 1 positions shown; findings below may reference images not displayed]

FINDINGS: Left anterior total hip replacement.  Satisfactory
position and alignment of the femoral and acetabular components.
Previous right total hip replacement.
IMPRESSION: As above.

## 2013-05-20 IMAGING — CR DG HIP 1V PORT*L*
1 series · 1 of 1 positions shown · non-contrast
Comparison: None.

CLINICAL DATA: Left hip osteoarthritis.

PORTABLE LEFT HIP - 1 VIEW

[AP]
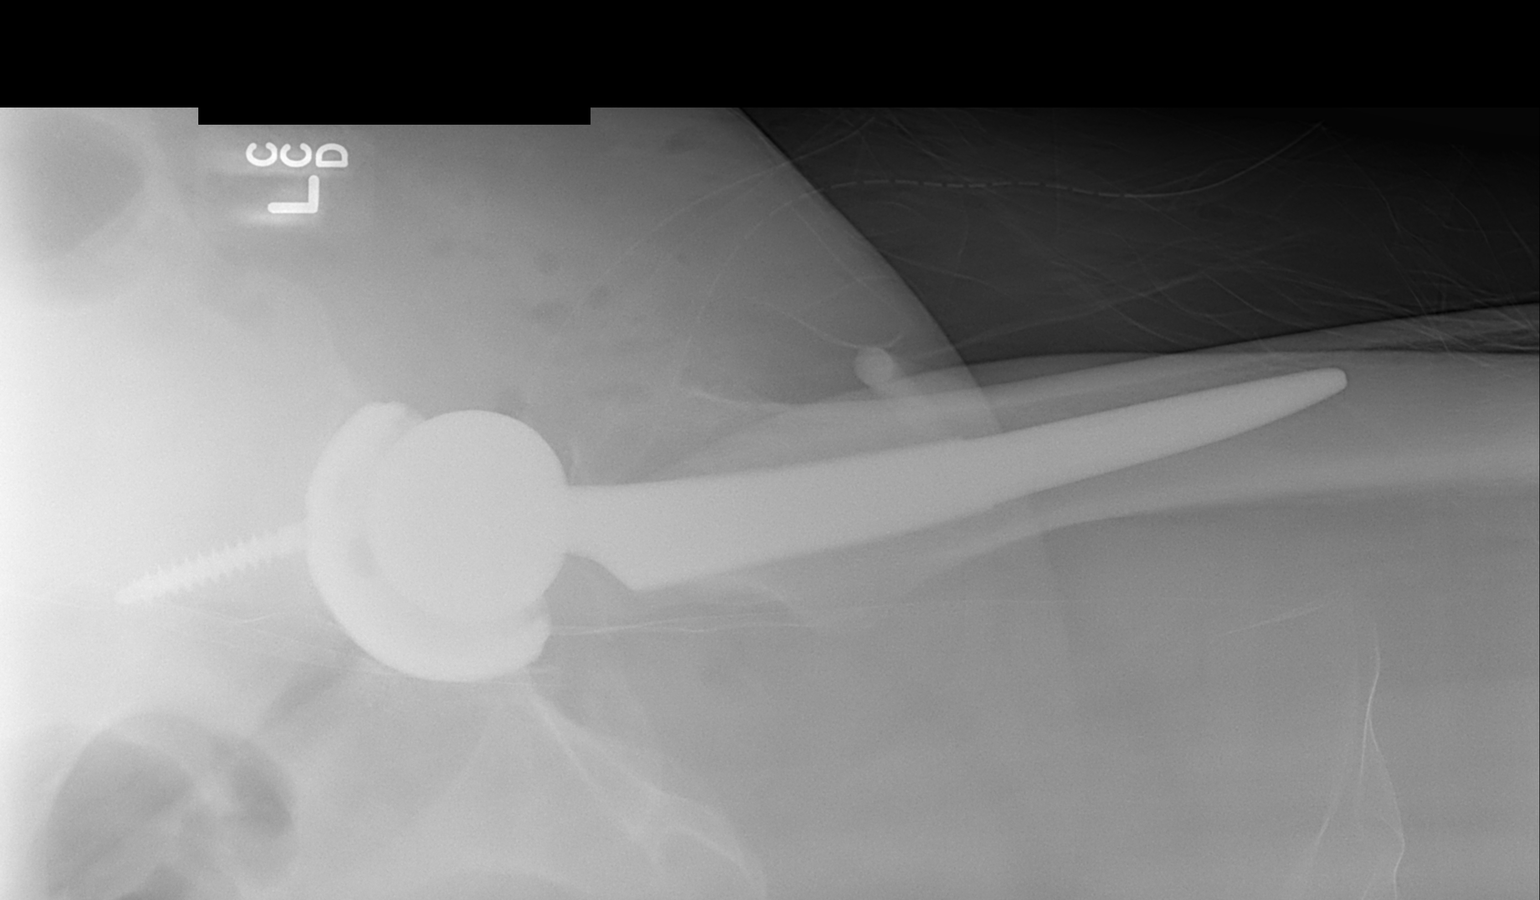

[1 of 1 positions shown; findings below may reference images not displayed]

FINDINGS: Anterior left total hip replacement in satisfactory
position.  Anatomic alignment of the femoral and acetabular
components.
IMPRESSION: As above.

## 2013-06-06 DIAGNOSIS — F909 Attention-deficit hyperactivity disorder, unspecified type: Secondary | ICD-10-CM | POA: Diagnosis not present

## 2013-06-15 DIAGNOSIS — IMO0002 Reserved for concepts with insufficient information to code with codable children: Secondary | ICD-10-CM | POA: Diagnosis not present

## 2013-06-15 DIAGNOSIS — M47817 Spondylosis without myelopathy or radiculopathy, lumbosacral region: Secondary | ICD-10-CM | POA: Diagnosis not present

## 2013-06-15 DIAGNOSIS — M5137 Other intervertebral disc degeneration, lumbosacral region: Secondary | ICD-10-CM | POA: Diagnosis not present

## 2013-06-20 DIAGNOSIS — L408 Other psoriasis: Secondary | ICD-10-CM | POA: Diagnosis not present

## 2013-06-20 DIAGNOSIS — D485 Neoplasm of uncertain behavior of skin: Secondary | ICD-10-CM | POA: Diagnosis not present

## 2013-06-20 DIAGNOSIS — L820 Inflamed seborrheic keratosis: Secondary | ICD-10-CM | POA: Diagnosis not present

## 2013-08-03 DIAGNOSIS — Z79899 Other long term (current) drug therapy: Secondary | ICD-10-CM | POA: Diagnosis not present

## 2013-08-03 DIAGNOSIS — G894 Chronic pain syndrome: Secondary | ICD-10-CM | POA: Diagnosis not present

## 2013-08-03 DIAGNOSIS — M5137 Other intervertebral disc degeneration, lumbosacral region: Secondary | ICD-10-CM | POA: Diagnosis not present

## 2013-09-07 DIAGNOSIS — M47817 Spondylosis without myelopathy or radiculopathy, lumbosacral region: Secondary | ICD-10-CM | POA: Diagnosis not present

## 2013-09-07 DIAGNOSIS — IMO0002 Reserved for concepts with insufficient information to code with codable children: Secondary | ICD-10-CM | POA: Diagnosis not present

## 2013-09-07 DIAGNOSIS — M47814 Spondylosis without myelopathy or radiculopathy, thoracic region: Secondary | ICD-10-CM | POA: Diagnosis not present

## 2013-09-07 DIAGNOSIS — M5137 Other intervertebral disc degeneration, lumbosacral region: Secondary | ICD-10-CM | POA: Diagnosis not present

## 2013-11-29 ENCOUNTER — Encounter (INDEPENDENT_AMBULATORY_CARE_PROVIDER_SITE_OTHER): Payer: Medicare Other

## 2013-11-30 DIAGNOSIS — M5137 Other intervertebral disc degeneration, lumbosacral region: Secondary | ICD-10-CM | POA: Diagnosis not present

## 2013-11-30 DIAGNOSIS — IMO0002 Reserved for concepts with insufficient information to code with codable children: Secondary | ICD-10-CM | POA: Diagnosis not present

## 2014-02-20 ENCOUNTER — Other Ambulatory Visit: Payer: Self-pay | Admitting: Internal Medicine

## 2014-02-20 DIAGNOSIS — R413 Other amnesia: Secondary | ICD-10-CM

## 2014-02-20 DIAGNOSIS — F329 Major depressive disorder, single episode, unspecified: Secondary | ICD-10-CM | POA: Diagnosis not present

## 2014-02-22 ENCOUNTER — Other Ambulatory Visit: Payer: Managed Care, Other (non HMO)

## 2014-02-25 ENCOUNTER — Ambulatory Visit
Admission: RE | Admit: 2014-02-25 | Discharge: 2014-02-25 | Disposition: A | Discharge status: Home / Self Care | Payer: Managed Care, Other (non HMO) | Source: Ambulatory Visit | Attending: Internal Medicine | Admitting: Internal Medicine

## 2014-02-25 DIAGNOSIS — R413 Other amnesia: Secondary | ICD-10-CM

## 2014-03-01 DIAGNOSIS — M47817 Spondylosis without myelopathy or radiculopathy, lumbosacral region: Secondary | ICD-10-CM | POA: Diagnosis not present

## 2014-03-06 DIAGNOSIS — F341 Dysthymic disorder: Secondary | ICD-10-CM | POA: Diagnosis not present

## 2014-03-13 DIAGNOSIS — H2510 Age-related nuclear cataract, unspecified eye: Secondary | ICD-10-CM | POA: Diagnosis not present

## 2014-03-13 DIAGNOSIS — H31019 Macula scars of posterior pole (postinflammatory) (post-traumatic), unspecified eye: Secondary | ICD-10-CM | POA: Diagnosis not present

## 2014-03-13 DIAGNOSIS — B5801 Toxoplasma chorioretinitis: Secondary | ICD-10-CM | POA: Diagnosis not present

## 2014-03-13 DIAGNOSIS — H524 Presbyopia: Secondary | ICD-10-CM | POA: Diagnosis not present

## 2014-03-15 DIAGNOSIS — M47817 Spondylosis without myelopathy or radiculopathy, lumbosacral region: Secondary | ICD-10-CM | POA: Diagnosis not present

## 2014-03-22 DIAGNOSIS — M545 Low back pain, unspecified: Secondary | ICD-10-CM | POA: Diagnosis not present

## 2014-03-22 DIAGNOSIS — M546 Pain in thoracic spine: Secondary | ICD-10-CM | POA: Diagnosis not present

## 2014-03-22 DIAGNOSIS — IMO0002 Reserved for concepts with insufficient information to code with codable children: Secondary | ICD-10-CM | POA: Diagnosis not present

## 2014-03-22 DIAGNOSIS — G8929 Other chronic pain: Secondary | ICD-10-CM | POA: Diagnosis not present

## 2014-04-12 DIAGNOSIS — K219 Gastro-esophageal reflux disease without esophagitis: Secondary | ICD-10-CM | POA: Diagnosis not present

## 2014-04-12 DIAGNOSIS — J069 Acute upper respiratory infection, unspecified: Secondary | ICD-10-CM | POA: Diagnosis not present

## 2014-04-12 DIAGNOSIS — R0789 Other chest pain: Secondary | ICD-10-CM | POA: Diagnosis not present

## 2014-05-23 DIAGNOSIS — F909 Attention-deficit hyperactivity disorder, unspecified type: Secondary | ICD-10-CM | POA: Diagnosis not present

## 2014-06-17 DIAGNOSIS — Z96649 Presence of unspecified artificial hip joint: Secondary | ICD-10-CM | POA: Diagnosis not present

## 2014-06-21 DIAGNOSIS — M545 Low back pain, unspecified: Secondary | ICD-10-CM | POA: Diagnosis not present

## 2014-06-21 DIAGNOSIS — IMO0002 Reserved for concepts with insufficient information to code with codable children: Secondary | ICD-10-CM | POA: Diagnosis not present

## 2014-06-21 DIAGNOSIS — M47817 Spondylosis without myelopathy or radiculopathy, lumbosacral region: Secondary | ICD-10-CM | POA: Diagnosis not present

## 2014-07-26 DIAGNOSIS — R091 Pleurisy: Secondary | ICD-10-CM | POA: Diagnosis not present

## 2014-07-26 DIAGNOSIS — R0789 Other chest pain: Secondary | ICD-10-CM | POA: Diagnosis not present

## 2014-07-29 DIAGNOSIS — R079 Chest pain, unspecified: Secondary | ICD-10-CM | POA: Diagnosis not present

## 2014-08-26 DIAGNOSIS — IMO0002 Reserved for concepts with insufficient information to code with codable children: Secondary | ICD-10-CM | POA: Diagnosis not present

## 2014-08-26 DIAGNOSIS — Z79899 Other long term (current) drug therapy: Secondary | ICD-10-CM | POA: Diagnosis not present

## 2014-08-26 DIAGNOSIS — M5137 Other intervertebral disc degeneration, lumbosacral region: Secondary | ICD-10-CM | POA: Diagnosis not present

## 2014-08-30 DIAGNOSIS — M4310 Spondylolisthesis, site unspecified: Secondary | ICD-10-CM | POA: Diagnosis not present

## 2014-08-30 DIAGNOSIS — M545 Low back pain, unspecified: Secondary | ICD-10-CM | POA: Diagnosis not present

## 2014-08-30 DIAGNOSIS — M47817 Spondylosis without myelopathy or radiculopathy, lumbosacral region: Secondary | ICD-10-CM | POA: Diagnosis not present

## 2014-08-30 DIAGNOSIS — IMO0002 Reserved for concepts with insufficient information to code with codable children: Secondary | ICD-10-CM | POA: Diagnosis not present

## 2014-08-30 DIAGNOSIS — M546 Pain in thoracic spine: Secondary | ICD-10-CM | POA: Diagnosis not present

## 2014-10-18 DIAGNOSIS — M545 Low back pain: Secondary | ICD-10-CM | POA: Diagnosis not present

## 2014-10-18 DIAGNOSIS — G894 Chronic pain syndrome: Secondary | ICD-10-CM | POA: Diagnosis not present

## 2014-10-18 DIAGNOSIS — M47814 Spondylosis without myelopathy or radiculopathy, thoracic region: Secondary | ICD-10-CM | POA: Diagnosis not present

## 2014-10-18 DIAGNOSIS — M5136 Other intervertebral disc degeneration, lumbar region: Secondary | ICD-10-CM | POA: Diagnosis not present

## 2014-10-18 DIAGNOSIS — M4316 Spondylolisthesis, lumbar region: Secondary | ICD-10-CM | POA: Diagnosis not present

## 2014-10-18 DIAGNOSIS — M5134 Other intervertebral disc degeneration, thoracic region: Secondary | ICD-10-CM | POA: Diagnosis not present

## 2014-10-18 DIAGNOSIS — M546 Pain in thoracic spine: Secondary | ICD-10-CM | POA: Diagnosis not present

## 2014-10-21 DIAGNOSIS — Z6829 Body mass index (BMI) 29.0-29.9, adult: Secondary | ICD-10-CM | POA: Diagnosis not present

## 2014-10-21 DIAGNOSIS — M4316 Spondylolisthesis, lumbar region: Secondary | ICD-10-CM | POA: Diagnosis not present

## 2014-11-05 ENCOUNTER — Other Ambulatory Visit: Payer: Self-pay | Admitting: Neurological Surgery

## 2014-12-03 ENCOUNTER — Encounter (HOSPITAL_COMMUNITY): Payer: Self-pay

## 2014-12-03 ENCOUNTER — Encounter (HOSPITAL_COMMUNITY)
Admission: RE | Admit: 2014-12-03 | Discharge: 2014-12-03 | Disposition: A | Discharge status: Home / Self Care | Payer: Managed Care, Other (non HMO) | Source: Ambulatory Visit | Attending: Neurological Surgery | Admitting: Neurological Surgery

## 2014-12-03 DIAGNOSIS — J45909 Unspecified asthma, uncomplicated: Secondary | ICD-10-CM | POA: Diagnosis not present

## 2014-12-03 DIAGNOSIS — Z01818 Encounter for other preprocedural examination: Secondary | ICD-10-CM | POA: Insufficient documentation

## 2014-12-03 DIAGNOSIS — G4730 Sleep apnea, unspecified: Secondary | ICD-10-CM | POA: Insufficient documentation

## 2014-12-03 DIAGNOSIS — M4310 Spondylolisthesis, site unspecified: Secondary | ICD-10-CM

## 2014-12-03 DIAGNOSIS — M4316 Spondylolisthesis, lumbar region: Secondary | ICD-10-CM | POA: Diagnosis not present

## 2014-12-03 HISTORY — DX: Blister (nonthermal) of unspecified hand, initial encounter: S60.529A

## 2014-12-03 HISTORY — DX: Other specified behavioral and emotional disorders with onset usually occurring in childhood and adolescence: F98.8

## 2014-12-03 HISTORY — DX: Carpal tunnel syndrome, bilateral upper limbs: G56.03

## 2014-12-03 LAB — CBC WITH DIFFERENTIAL/PLATELET
BASOS ABS: 0 10*3/uL (ref 0.0–0.1)
Basophils Relative: 0 % (ref 0–1)
Eosinophils Absolute: 0.2 10*3/uL (ref 0.0–0.7)
Eosinophils Relative: 3 % (ref 0–5)
HEMATOCRIT: 40.5 % (ref 36.0–46.0)
Hemoglobin: 13.6 g/dL (ref 12.0–15.0)
LYMPHS PCT: 23 % (ref 12–46)
Lymphs Abs: 1.9 10*3/uL (ref 0.7–4.0)
MCH: 29 pg (ref 26.0–34.0)
MCHC: 33.6 g/dL (ref 30.0–36.0)
MCV: 86.4 fL (ref 78.0–100.0)
Monocytes Absolute: 0.5 10*3/uL (ref 0.1–1.0)
Monocytes Relative: 6 % (ref 3–12)
NEUTROS ABS: 5.5 10*3/uL (ref 1.7–7.7)
NEUTROS PCT: 68 % (ref 43–77)
PLATELETS: 226 10*3/uL (ref 150–400)
RBC: 4.69 MIL/uL (ref 3.87–5.11)
RDW: 13.4 % (ref 11.5–15.5)
WBC: 8.1 10*3/uL (ref 4.0–10.5)

## 2014-12-03 LAB — TYPE AND SCREEN
ABO/RH(D): O POS
Antibody Screen: NEGATIVE

## 2014-12-03 LAB — BASIC METABOLIC PANEL
ANION GAP: 11 (ref 5–15)
BUN: 9 mg/dL (ref 6–23)
CALCIUM: 9.5 mg/dL (ref 8.4–10.5)
CO2: 29 meq/L (ref 19–32)
Chloride: 96 mEq/L (ref 96–112)
Creatinine, Ser: 1.01 mg/dL (ref 0.50–1.10)
GFR, EST AFRICAN AMERICAN: 69 mL/min — AB (ref >90)
GFR, EST NON AFRICAN AMERICAN: 59 mL/min — AB (ref >90)
Glucose, Bld: 95 mg/dL (ref 70–99)
Potassium: 4.1 mEq/L (ref 3.7–5.3)
SODIUM: 136 meq/L — AB (ref 137–147)

## 2014-12-03 LAB — PROTIME-INR
INR: 0.93 (ref 0.00–1.49)
PROTHROMBIN TIME: 12.6 s (ref 11.6–15.2)

## 2014-12-03 LAB — SURGICAL PCR SCREEN
MRSA, PCR: NEGATIVE
STAPHYLOCOCCUS AUREUS: POSITIVE — AB

## 2014-12-03 LAB — ABO/RH: ABO/RH(D): O POS

## 2014-12-03 NOTE — Progress Notes (Signed)
Nurse called in Bactroban ointment to [facility] and informed patient of positive PCR results.

## 2014-12-03 NOTE — Pre-Procedure Instructions (Signed)
Abigail Huerta  12/03/2014   Your procedure is scheduled on:  Thursday December 12, 2014 at 7:30 AM.  Report to [facility] Admitting at 5:30 AM.  Call this number if you have problems the morning of surgery: [phone]   Remember:   Do not eat food or drink liquids after midnight.   Take these medicines the morning of surgery with A SIP OF WATER: Albuterol inhaler if needed, Escitalopram (Lexapro), Ritalin, Dilaudid if needed, Promethazine (Phenergan) if needed, and Tizanidine (Zanaflex)   Do not wear jewelry, make-up or nail polish.  Do not wear lotions, powders, or perfumes.   Do not shave 48 hours prior to surgery.   Do not bring valuables to the hospital.  [facility] is not responsible for any belongings or valuables.               Contacts, dentures or bridgework may not be worn into surgery.  Leave suitcase in the car. After surgery it may be brought to your room.  For patients admitted to the hospital, discharge time is determined by your  treatment team.               Patients discharged the day of surgery will not be allowed to drive home.  Name and phone number of your driver:   Special Instructions: Shower using CHG soap the night before and the morning of your surgery   Please read over the following fact sheets that you were given: Pain Booklet, Coughing and Deep Breathing, Blood Transfusion Information, MRSA Information and Surgical Site Infection Prevention

## 2014-12-03 NOTE — Progress Notes (Signed)
PCP is Elenora Gamma at [facility]. Patient denied having any acute cardiac issues and informed Nurse that she last used Albuterol inhaler approximately 3 months ago.

## 2014-12-19 ENCOUNTER — Encounter (HOSPITAL_COMMUNITY)
Admission: RE | Admit: 2014-12-19 | Discharge: 2014-12-19 | Disposition: A | Discharge status: Home / Self Care | Payer: Managed Care, Other (non HMO) | Source: Ambulatory Visit | Attending: Neurological Surgery | Admitting: Neurological Surgery

## 2014-12-19 DIAGNOSIS — Z01812 Encounter for preprocedural laboratory examination: Secondary | ICD-10-CM | POA: Insufficient documentation

## 2014-12-19 LAB — BASIC METABOLIC PANEL
Anion gap: 9 (ref 5–15)
BUN: 8 mg/dL (ref 6–23)
CO2: 27 mmol/L (ref 19–32)
Calcium: 9.2 mg/dL (ref 8.4–10.5)
Chloride: 103 mEq/L (ref 96–112)
Creatinine, Ser: 0.98 mg/dL (ref 0.50–1.10)
GFR calc Af Amer: 71 mL/min — ABNORMAL LOW (ref >90)
GFR, EST NON AFRICAN AMERICAN: 61 mL/min — AB (ref >90)
GLUCOSE: 105 mg/dL — AB (ref 70–99)
POTASSIUM: 4 mmol/L (ref 3.5–5.1)
Sodium: 139 mmol/L (ref 135–145)

## 2014-12-19 LAB — CBC
HCT: 38.7 % (ref 36.0–46.0)
Hemoglobin: 13.2 g/dL (ref 12.0–15.0)
MCH: 28.6 pg (ref 26.0–34.0)
MCHC: 34.1 g/dL (ref 30.0–36.0)
MCV: 83.8 fL (ref 78.0–100.0)
Platelets: 222 10*3/uL (ref 150–400)
RBC: 4.62 MIL/uL (ref 3.87–5.11)
RDW: 13 % (ref 11.5–15.5)
WBC: 7 10*3/uL (ref 4.0–10.5)

## 2014-12-19 LAB — TYPE AND SCREEN
ABO/RH(D): O POS
Antibody Screen: NEGATIVE

## 2014-12-25 MED ORDER — DEXAMETHASONE SODIUM PHOSPHATE 10 MG/ML IJ SOLN
10.0000 mg | INTRAMUSCULAR | Status: AC
  Administered 2014-12-26: 10 mg via INTRAVENOUS
  Filled 2014-12-25: qty 1

## 2014-12-25 MED ORDER — CEFAZOLIN SODIUM-DEXTROSE 2-3 GM-% IV SOLR
2.0000 g | INTRAVENOUS | Status: AC
  Administered 2014-12-26: 2 g via INTRAVENOUS
  Filled 2014-12-25: qty 50

## 2014-12-26 ENCOUNTER — Encounter (HOSPITAL_COMMUNITY)
Admission: RE | Disposition: A | Discharge status: Home / Self Care | Payer: Self-pay | Source: Ambulatory Visit | Attending: Neurological Surgery

## 2014-12-26 ENCOUNTER — Inpatient Hospital Stay (HOSPITAL_COMMUNITY): Payer: Managed Care, Other (non HMO) | Admitting: Anesthesiology

## 2014-12-26 ENCOUNTER — Inpatient Hospital Stay (HOSPITAL_COMMUNITY): Payer: Managed Care, Other (non HMO)

## 2014-12-26 ENCOUNTER — Inpatient Hospital Stay (HOSPITAL_COMMUNITY)
Admission: RE | Admit: 2014-12-26 | Discharge: 2014-12-27 | DRG: 460 | Disposition: A | Discharge status: Home / Self Care | Payer: Managed Care, Other (non HMO) | Source: Ambulatory Visit | Attending: Neurological Surgery | Admitting: Neurological Surgery

## 2014-12-26 ENCOUNTER — Encounter (HOSPITAL_COMMUNITY): Payer: Self-pay | Admitting: Neurological Surgery

## 2014-12-26 DIAGNOSIS — M4316 Spondylolisthesis, lumbar region: Secondary | ICD-10-CM | POA: Diagnosis not present

## 2014-12-26 DIAGNOSIS — M4310 Spondylolisthesis, site unspecified: Secondary | ICD-10-CM

## 2014-12-26 DIAGNOSIS — F988 Other specified behavioral and emotional disorders with onset usually occurring in childhood and adolescence: Secondary | ICD-10-CM | POA: Diagnosis present

## 2014-12-26 DIAGNOSIS — G4730 Sleep apnea, unspecified: Secondary | ICD-10-CM | POA: Diagnosis present

## 2014-12-26 DIAGNOSIS — F329 Major depressive disorder, single episode, unspecified: Secondary | ICD-10-CM | POA: Diagnosis present

## 2014-12-26 DIAGNOSIS — M4806 Spinal stenosis, lumbar region: Secondary | ICD-10-CM | POA: Diagnosis present

## 2014-12-26 DIAGNOSIS — Z981 Arthrodesis status: Secondary | ICD-10-CM

## 2014-12-26 DIAGNOSIS — J45909 Unspecified asthma, uncomplicated: Secondary | ICD-10-CM | POA: Diagnosis present

## 2014-12-26 DIAGNOSIS — F419 Anxiety disorder, unspecified: Secondary | ICD-10-CM | POA: Diagnosis present

## 2014-12-26 DIAGNOSIS — Z96649 Presence of unspecified artificial hip joint: Secondary | ICD-10-CM | POA: Diagnosis present

## 2014-12-26 DIAGNOSIS — M1990 Unspecified osteoarthritis, unspecified site: Secondary | ICD-10-CM | POA: Diagnosis not present

## 2014-12-26 DIAGNOSIS — M797 Fibromyalgia: Secondary | ICD-10-CM | POA: Diagnosis present

## 2014-12-26 HISTORY — PX: MAXIMUM ACCESS (MAS)POSTERIOR LUMBAR INTERBODY FUSION (PLIF) 1 LEVEL: SHX6368

## 2014-12-26 SURGERY — FOR MAXIMUM ACCESS (MAS) POSTERIOR LUMBAR INTERBODY FUSION (PLIF) 1 LEVEL
Anesthesia: General | Site: Back

## 2014-12-26 MED ORDER — SODIUM CHLORIDE 0.9 % IJ SOLN
INTRAMUSCULAR | Status: AC
  Filled 2014-12-26: qty 20

## 2014-12-26 MED ORDER — SODIUM CHLORIDE 0.9 % IR SOLN
Status: DC | PRN
  Administered 2014-12-26: 500 mL

## 2014-12-26 MED ORDER — METHYLPHENIDATE HCL ER (OSM) 18 MG PO TBCR
18.0000 mg | EXTENDED_RELEASE_TABLET | Freq: Every day | ORAL | Status: DC
  Filled 2014-12-26 (×2): qty 1

## 2014-12-26 MED ORDER — PHENYLEPHRINE 40 MCG/ML (10ML) SYRINGE FOR IV PUSH (FOR BLOOD PRESSURE SUPPORT)
PREFILLED_SYRINGE | INTRAVENOUS | Status: AC
Start: 2014-12-26 — End: 2014-12-26
  Filled 2014-12-26: qty 10

## 2014-12-26 MED ORDER — PROPOFOL 10 MG/ML IV BOLUS
INTRAVENOUS | Status: DC | PRN
  Administered 2014-12-26: 160 mg via INTRAVENOUS

## 2014-12-26 MED ORDER — GLYCOPYRROLATE 0.2 MG/ML IJ SOLN
INTRAMUSCULAR | Status: DC | PRN
  Administered 2014-12-26: 0.2 mg via INTRAVENOUS

## 2014-12-26 MED ORDER — HYDROMORPHONE HCL 1 MG/ML IJ SOLN
0.2500 mg | INTRAMUSCULAR | Status: DC | PRN
  Administered 2014-12-26 (×4): 0.5 mg via INTRAVENOUS

## 2014-12-26 MED ORDER — HYDROMORPHONE HCL 1 MG/ML IJ SOLN
1.0000 mg | INTRAMUSCULAR | Status: DC | PRN
  Administered 2014-12-26: 1 mg via INTRAVENOUS

## 2014-12-26 MED ORDER — ALBUTEROL SULFATE HFA 108 (90 BASE) MCG/ACT IN AERS: 2.0000 | INHALATION_SPRAY | Freq: Four times a day (QID) | RESPIRATORY_TRACT | Status: DC | PRN

## 2014-12-26 MED ORDER — PHENOL 1.4 % MT LIQD: 1.0000 | OROMUCOSAL | Status: DC | PRN

## 2014-12-26 MED ORDER — DEXAMETHASONE SODIUM PHOSPHATE 10 MG/ML IJ SOLN
INTRAMUSCULAR | Status: AC
  Filled 2014-12-26: qty 1

## 2014-12-26 MED ORDER — ESCITALOPRAM OXALATE 10 MG PO TABS
20.0000 mg | ORAL_TABLET | Freq: Every morning | ORAL | Status: DC
  Administered 2014-12-26 – 2014-12-27 (×2): 20 mg via ORAL
  Filled 2014-12-26 (×2): qty 2

## 2014-12-26 MED ORDER — BUPIVACAINE HCL (PF) 0.25 % IJ SOLN
INTRAMUSCULAR | Status: DC | PRN
  Administered 2014-12-26: 5 mL

## 2014-12-26 MED ORDER — OXYCODONE HCL 5 MG/5ML PO SOLN: 5.0000 mg | Freq: Once | ORAL | Status: DC | PRN

## 2014-12-26 MED ORDER — HYDROMORPHONE HCL 1 MG/ML IJ SOLN
INTRAMUSCULAR | Status: AC
  Administered 2014-12-26: 0.5 mg via INTRAVENOUS
  Filled 2014-12-26: qty 1

## 2014-12-26 MED ORDER — KETAMINE HCL 10 MG/ML IJ SOLN
INTRAMUSCULAR | Status: DC | PRN
  Administered 2014-12-26: 10 mg via INTRAVENOUS
  Administered 2014-12-26: 30 mg via INTRAVENOUS
  Administered 2014-12-26: 10 mg via INTRAVENOUS
  Administered 2014-12-26: 20 mg via INTRAVENOUS

## 2014-12-26 MED ORDER — LORAZEPAM 1 MG PO TABS: 1.0000 mg | ORAL_TABLET | Freq: Every evening | ORAL | Status: DC | PRN

## 2014-12-26 MED ORDER — GELATIN ABSORBABLE MT POWD
OROMUCOSAL | Status: DC | PRN
  Administered 2014-12-26: 10 mL via TOPICAL

## 2014-12-26 MED ORDER — PROMETHAZINE HCL 25 MG PO TABS: 25.0000 mg | ORAL_TABLET | Freq: Four times a day (QID) | ORAL | Status: DC | PRN

## 2014-12-26 MED ORDER — DEXAMETHASONE 4 MG PO TABS
4.0000 mg | ORAL_TABLET | Freq: Four times a day (QID) | ORAL | Status: DC
  Administered 2014-12-26 – 2014-12-27 (×4): 4 mg via ORAL
  Filled 2014-12-26 (×4): qty 1

## 2014-12-26 MED ORDER — FENTANYL CITRATE 0.05 MG/ML IJ SOLN
INTRAMUSCULAR | Status: AC
  Filled 2014-12-26: qty 5

## 2014-12-26 MED ORDER — MENTHOL 3 MG MT LOZG: 1.0000 | LOZENGE | OROMUCOSAL | Status: DC | PRN

## 2014-12-26 MED ORDER — PHENYLEPHRINE HCL 10 MG/ML IJ SOLN
INTRAMUSCULAR | Status: DC | PRN
  Administered 2014-12-26: 120 ug via INTRAVENOUS

## 2014-12-26 MED ORDER — SUCCINYLCHOLINE CHLORIDE 20 MG/ML IJ SOLN
INTRAMUSCULAR | Status: AC
Start: 2014-12-26 — End: 2014-12-26
  Filled 2014-12-26: qty 1

## 2014-12-26 MED ORDER — TIZANIDINE HCL 4 MG PO TABS
4.0000 mg | ORAL_TABLET | Freq: Four times a day (QID) | ORAL | Status: DC
  Administered 2014-12-26 – 2014-12-27 (×4): 4 mg via ORAL
  Filled 2014-12-26 (×7): qty 1

## 2014-12-26 MED ORDER — ACETAMINOPHEN 10 MG/ML IV SOLN
INTRAVENOUS | Status: AC
  Filled 2014-12-26: qty 100

## 2014-12-26 MED ORDER — KETOROLAC TROMETHAMINE 30 MG/ML IJ SOLN
INTRAMUSCULAR | Status: AC
  Filled 2014-12-26: qty 1

## 2014-12-26 MED ORDER — DIPHENHYDRAMINE HCL 25 MG PO CAPS: 25.0000 mg | ORAL_CAPSULE | Freq: Every day | ORAL | Status: DC | PRN

## 2014-12-26 MED ORDER — BUPROPION HCL ER (XL) 150 MG PO TB24
300.0000 mg | ORAL_TABLET | Freq: Every day | ORAL | Status: DC
  Administered 2014-12-26: 300 mg via ORAL
  Filled 2014-12-26: qty 2

## 2014-12-26 MED ORDER — FERROUS SULFATE 325 (65 FE) MG PO TABS
325.0000 mg | ORAL_TABLET | Freq: Three times a day (TID) | ORAL | Status: DC
  Administered 2014-12-26 – 2014-12-27 (×3): 325 mg via ORAL
  Filled 2014-12-26 (×3): qty 1

## 2014-12-26 MED ORDER — ONDANSETRON HCL 4 MG/2ML IJ SOLN: 4.0000 mg | INTRAMUSCULAR | Status: DC | PRN

## 2014-12-26 MED ORDER — ALBUTEROL SULFATE (2.5 MG/3ML) 0.083% IN NEBU: 2.5000 mg | INHALATION_SOLUTION | Freq: Four times a day (QID) | RESPIRATORY_TRACT | Status: DC | PRN

## 2014-12-26 MED ORDER — CEFAZOLIN SODIUM 1-5 GM-% IV SOLN
1.0000 g | Freq: Three times a day (TID) | INTRAVENOUS | Status: AC
  Administered 2014-12-26 – 2014-12-27 (×2): 1 g via INTRAVENOUS
  Filled 2014-12-26 (×2): qty 50

## 2014-12-26 MED ORDER — DEXMEDETOMIDINE HCL IN NACL 200 MCG/50ML IV SOLN
INTRAVENOUS | Status: DC | PRN
  Administered 2014-12-26: 0.4 ug/kg/h via INTRAVENOUS

## 2014-12-26 MED ORDER — LIDOCAINE HCL (CARDIAC) 20 MG/ML IV SOLN
INTRAVENOUS | Status: DC | PRN
  Administered 2014-12-26: 40 mg via INTRAVENOUS

## 2014-12-26 MED ORDER — SODIUM CHLORIDE 0.9 % IJ SOLN: 3.0000 mL | INTRAMUSCULAR | Status: DC | PRN

## 2014-12-26 MED ORDER — DEXAMETHASONE SODIUM PHOSPHATE 4 MG/ML IJ SOLN: 4.0000 mg | Freq: Four times a day (QID) | INTRAMUSCULAR | Status: DC

## 2014-12-26 MED ORDER — CELECOXIB 200 MG PO CAPS
200.0000 mg | ORAL_CAPSULE | Freq: Two times a day (BID) | ORAL | Status: DC
  Administered 2014-12-26 – 2014-12-27 (×3): 200 mg via ORAL
  Filled 2014-12-26 (×3): qty 1

## 2014-12-26 MED ORDER — PROPOFOL 10 MG/ML IV BOLUS
INTRAVENOUS | Status: AC
  Filled 2014-12-26: qty 20

## 2014-12-26 MED ORDER — HYDROMORPHONE HCL 2 MG PO TABS
4.0000 mg | ORAL_TABLET | Freq: Four times a day (QID) | ORAL | Status: DC | PRN
  Administered 2014-12-26 – 2014-12-27 (×3): 4 mg via ORAL
  Filled 2014-12-26 (×3): qty 2

## 2014-12-26 MED ORDER — ALBUMIN HUMAN 5 % IV SOLN
INTRAVENOUS | Status: DC | PRN
  Administered 2014-12-26: 10:00:00 via INTRAVENOUS

## 2014-12-26 MED ORDER — ACETAMINOPHEN 325 MG PO TABS: 650.0000 mg | ORAL_TABLET | ORAL | Status: DC | PRN

## 2014-12-26 MED ORDER — THROMBIN 20000 UNITS EX SOLR
CUTANEOUS | Status: DC | PRN
  Administered 2014-12-26: 20 mL via TOPICAL

## 2014-12-26 MED ORDER — LIDOCAINE HCL 4 % MT SOLN
OROMUCOSAL | Status: DC | PRN
  Administered 2014-12-26: 3 mL via TOPICAL

## 2014-12-26 MED ORDER — ONDANSETRON HCL 4 MG/2ML IJ SOLN
INTRAMUSCULAR | Status: DC | PRN
  Administered 2014-12-26: 4 mg via INTRAVENOUS

## 2014-12-26 MED ORDER — POTASSIUM CHLORIDE IN NACL 20-0.9 MEQ/L-% IV SOLN
INTRAVENOUS | Status: DC
  Administered 2014-12-26: 14:00:00 via INTRAVENOUS
  Filled 2014-12-26: qty 1000

## 2014-12-26 MED ORDER — FENTANYL 100 MCG/HR TD PT72: 100.0000 ug | MEDICATED_PATCH | TRANSDERMAL | Status: DC

## 2014-12-26 MED ORDER — MIDAZOLAM HCL 5 MG/5ML IJ SOLN
INTRAMUSCULAR | Status: DC | PRN
  Administered 2014-12-26: 2 mg via INTRAVENOUS

## 2014-12-26 MED ORDER — ACETAMINOPHEN 650 MG RE SUPP: 650.0000 mg | RECTAL | Status: DC | PRN

## 2014-12-26 MED ORDER — 0.9 % SODIUM CHLORIDE (POUR BTL) OPTIME
TOPICAL | Status: DC | PRN
  Administered 2014-12-26: 1000 mL

## 2014-12-26 MED ORDER — OXYCODONE HCL 5 MG PO TABS: 5.0000 mg | ORAL_TABLET | Freq: Once | ORAL | Status: DC | PRN

## 2014-12-26 MED ORDER — DOCUSATE SODIUM 100 MG PO CAPS
100.0000 mg | ORAL_CAPSULE | Freq: Two times a day (BID) | ORAL | Status: DC
  Administered 2014-12-26 – 2014-12-27 (×3): 100 mg via ORAL
  Filled 2014-12-26 (×3): qty 1

## 2014-12-26 MED ORDER — DEXMEDETOMIDINE HCL IN NACL 200 MCG/50ML IV SOLN
INTRAVENOUS | Status: AC
Start: 2014-12-26 — End: 2014-12-26
  Filled 2014-12-26: qty 50

## 2014-12-26 MED ORDER — ASPIRIN EC 325 MG PO TBEC
325.0000 mg | DELAYED_RELEASE_TABLET | Freq: Two times a day (BID) | ORAL | Status: DC
  Administered 2014-12-26 – 2014-12-27 (×3): 325 mg via ORAL
  Filled 2014-12-26 (×3): qty 1

## 2014-12-26 MED ORDER — SODIUM CHLORIDE 0.9 % IJ SOLN
3.0000 mL | Freq: Two times a day (BID) | INTRAMUSCULAR | Status: DC
  Administered 2014-12-26 – 2014-12-27 (×2): 3 mL via INTRAVENOUS

## 2014-12-26 MED ORDER — LIDOCAINE HCL (CARDIAC) 20 MG/ML IV SOLN
INTRAVENOUS | Status: AC
  Filled 2014-12-26: qty 5

## 2014-12-26 MED ORDER — GLYCOPYRROLATE 0.2 MG/ML IJ SOLN
INTRAMUSCULAR | Status: AC
  Filled 2014-12-26: qty 1

## 2014-12-26 MED ORDER — ACETAMINOPHEN 10 MG/ML IV SOLN
INTRAVENOUS | Status: DC | PRN
  Administered 2014-12-26: 1000 mg via INTRAVENOUS

## 2014-12-26 MED ORDER — PROMETHAZINE HCL 25 MG/ML IJ SOLN: 6.2500 mg | INTRAMUSCULAR | Status: DC | PRN

## 2014-12-26 MED ORDER — EPHEDRINE SULFATE 50 MG/ML IJ SOLN
INTRAMUSCULAR | Status: AC
Start: 2014-12-26 — End: 2014-12-26
  Filled 2014-12-26: qty 1

## 2014-12-26 MED ORDER — PHENYLEPHRINE HCL 10 MG/ML IJ SOLN
10.0000 mg | INTRAVENOUS | Status: DC | PRN
  Administered 2014-12-26: 10 ug/min via INTRAVENOUS

## 2014-12-26 MED ORDER — KETOROLAC TROMETHAMINE 30 MG/ML IJ SOLN
30.0000 mg | Freq: Four times a day (QID) | INTRAMUSCULAR | Status: DC | PRN
  Administered 2014-12-26: 30 mg via INTRAVENOUS

## 2014-12-26 MED ORDER — LACTATED RINGERS IV SOLN
INTRAVENOUS | Status: DC | PRN
  Administered 2014-12-26 (×2): via INTRAVENOUS

## 2014-12-26 MED ORDER — HYDROMORPHONE HCL 1 MG/ML IJ SOLN
0.5000 mg | INTRAMUSCULAR | Status: DC | PRN
  Administered 2014-12-26: 0.5 mg via INTRAVENOUS
  Administered 2014-12-27: 1 mg via INTRAVENOUS
  Filled 2014-12-26 (×2): qty 1

## 2014-12-26 MED ORDER — MIDAZOLAM HCL 2 MG/2ML IJ SOLN
INTRAMUSCULAR | Status: AC
  Filled 2014-12-26: qty 2

## 2014-12-26 MED ORDER — SODIUM CHLORIDE 0.9 % IV SOLN: 250.0000 mL | INTRAVENOUS | Status: DC

## 2014-12-26 MED ORDER — ONDANSETRON HCL 4 MG/2ML IJ SOLN
INTRAMUSCULAR | Status: AC
  Filled 2014-12-26: qty 2

## 2014-12-26 MED ORDER — FENTANYL CITRATE 0.05 MG/ML IJ SOLN
INTRAMUSCULAR | Status: DC | PRN
  Administered 2014-12-26: 100 ug via INTRAVENOUS
  Administered 2014-12-26 (×3): 50 ug via INTRAVENOUS

## 2014-12-26 MED ORDER — KETAMINE HCL 100 MG/ML IJ SOLN
INTRAMUSCULAR | Status: AC
  Filled 2014-12-26: qty 1

## 2014-12-26 MED ORDER — SUCCINYLCHOLINE CHLORIDE 20 MG/ML IJ SOLN
INTRAMUSCULAR | Status: DC | PRN
  Administered 2014-12-26: 120 mg via INTRAVENOUS

## 2014-12-26 MED ORDER — HYDROMORPHONE HCL 1 MG/ML IJ SOLN
INTRAMUSCULAR | Status: AC
  Filled 2014-12-26: qty 1

## 2014-12-26 SURGICAL SUPPLY — 68 items
APL SKNCLS STERI-STRIP NONHPOA (GAUZE/BANDAGES/DRESSINGS) ×1
BAG DECANTER FOR FLEXI CONT (MISCELLANEOUS) ×2 IMPLANT
BENZOIN TINCTURE PRP APPL 2/3 (GAUZE/BANDAGES/DRESSINGS) ×2 IMPLANT
BLADE CLIPPER SURG (BLADE) IMPLANT
BUR MATCHSTICK NEURO 3.0 LAGG (BURR) ×2 IMPLANT
CAGE MAS PLIF 9X9X23-8 LUMBAR (Cage) ×2 IMPLANT
CANISTER SUCT 3000ML (MISCELLANEOUS) ×2 IMPLANT
CLIP NEUROVISION LG (CLIP) ×1 IMPLANT
CONT SPEC 4OZ CLIKSEAL STRL BL (MISCELLANEOUS) ×4 IMPLANT
COVER BACK TABLE 24X17X13 BIG (DRAPES) IMPLANT
COVER BACK TABLE 60X90IN (DRAPES) ×2 IMPLANT
DRAPE C-ARM 42X72 X-RAY (DRAPES) ×2 IMPLANT
DRAPE C-ARMOR (DRAPES) ×2 IMPLANT
DRAPE LAPAROTOMY 100X72X124 (DRAPES) ×2 IMPLANT
DRAPE POUCH INSTRU U-SHP 10X18 (DRAPES) ×2 IMPLANT
DRAPE SURG 17X23 STRL (DRAPES) ×2 IMPLANT
DRSG OPSITE 4X5.5 SM (GAUZE/BANDAGES/DRESSINGS) ×4 IMPLANT
DRSG OPSITE POSTOP 4X6 (GAUZE/BANDAGES/DRESSINGS) ×1 IMPLANT
DRSG TELFA 3X8 NADH (GAUZE/BANDAGES/DRESSINGS) ×2 IMPLANT
DURAPREP 26ML APPLICATOR (WOUND CARE) ×2 IMPLANT
ELECT REM PT RETURN 9FT ADLT (ELECTROSURGICAL) ×2
ELECTRODE REM PT RTRN 9FT ADLT (ELECTROSURGICAL) ×1 IMPLANT
EVACUATOR 1/8 PVC DRAIN (DRAIN) ×2 IMPLANT
GAUZE SPONGE 4X4 16PLY XRAY LF (GAUZE/BANDAGES/DRESSINGS) IMPLANT
GLOVE BIO SURGEON STRL SZ8 (GLOVE) ×3 IMPLANT
GLOVE BIOGEL PI IND STRL 7.0 (GLOVE) IMPLANT
GLOVE BIOGEL PI INDICATOR 7.0 (GLOVE) ×2
GLOVE ECLIPSE 9.0 STRL (GLOVE) ×1 IMPLANT
GLOVE SS N UNI LF 7.0 STRL (GLOVE) ×4 IMPLANT
GOWN STRL REUS W/ TWL LRG LVL3 (GOWN DISPOSABLE) IMPLANT
GOWN STRL REUS W/ TWL XL LVL3 (GOWN DISPOSABLE) ×2 IMPLANT
GOWN STRL REUS W/TWL 2XL LVL3 (GOWN DISPOSABLE) IMPLANT
GOWN STRL REUS W/TWL LRG LVL3 (GOWN DISPOSABLE)
GOWN STRL REUS W/TWL XL LVL3 (GOWN DISPOSABLE) ×6
HEMOSTAT POWDER KIT SURGIFOAM (HEMOSTASIS) IMPLANT
KIT BASIN OR (CUSTOM PROCEDURE TRAY) ×2 IMPLANT
KIT NDL NVM5 EMG ELECT (KITS) IMPLANT
KIT NEEDLE NVM5 EMG ELECT (KITS) ×1 IMPLANT
KIT NEEDLE NVM5 EMG ELECTRODE (KITS) ×1
KIT ROOM TURNOVER OR (KITS) ×2 IMPLANT
MILL MEDIUM DISP (BLADE) IMPLANT
NDL HYPO 25X1 1.5 SAFETY (NEEDLE) ×1 IMPLANT
NEEDLE HYPO 25X1 1.5 SAFETY (NEEDLE) ×2 IMPLANT
NS IRRIG 1000ML POUR BTL (IV SOLUTION) ×2 IMPLANT
PACK LAMINECTOMY NEURO (CUSTOM PROCEDURE TRAY) ×2 IMPLANT
PAD ARMBOARD 7.5X6 YLW CONV (MISCELLANEOUS) ×6 IMPLANT
PAD DRESSING TELFA 3X8 NADH (GAUZE/BANDAGES/DRESSINGS) ×1 IMPLANT
PUTTY STIMUBLAST 2.5CC (Putty) ×1 IMPLANT
ROD 35MM (Rod) ×2 IMPLANT
SCREW LOCK (Screw) ×8 IMPLANT
SCREW LOCK FXNS SPNE MAS PL (Screw) IMPLANT
SCREW PLIF MAS 5.0X35 (Screw) ×2 IMPLANT
SCREW SHANK 5.0X35 (Screw) ×2 IMPLANT
SCREW TULIP 5.5 (Screw) ×2 IMPLANT
SPONGE LAP 4X18 X RAY DECT (DISPOSABLE) IMPLANT
SPONGE SURGIFOAM ABS GEL 100 (HEMOSTASIS) ×2 IMPLANT
STRIP CLOSURE SKIN 1/2X4 (GAUZE/BANDAGES/DRESSINGS) ×4 IMPLANT
SUT VIC AB 0 CT1 18XCR BRD8 (SUTURE) ×1 IMPLANT
SUT VIC AB 0 CT1 8-18 (SUTURE) ×2
SUT VIC AB 2-0 CP2 18 (SUTURE) ×2 IMPLANT
SUT VIC AB 3-0 SH 8-18 (SUTURE) ×4 IMPLANT
SYR 20ML ECCENTRIC (SYRINGE) ×2 IMPLANT
SYR 3ML LL SCALE MARK (SYRINGE) IMPLANT
TAPE STRIPS DRAPE STRL (GAUZE/BANDAGES/DRESSINGS) ×1 IMPLANT
TOWEL OR 17X24 6PK STRL BLUE (TOWEL DISPOSABLE) ×2 IMPLANT
TOWEL OR 17X26 10 PK STRL BLUE (TOWEL DISPOSABLE) ×2 IMPLANT
TRAY FOLEY CATH 14FRSI W/METER (CATHETERS) ×2 IMPLANT
WATER STERILE IRR 1000ML POUR (IV SOLUTION) ×2 IMPLANT

## 2014-12-26 NOTE — Progress Notes (Signed)
UR completed 

## 2014-12-26 NOTE — H&P (Signed)
Subjective: Patient is a 58 y.o. female admitted for PLIF L4-5. Onset of symptoms was years ago, progressively worse since that time.  The pain is rated intense, and is located at the across the low back and radiates to legs. The pain is described as aching and occurs constantly. The symptoms have been progressive. Symptoms are exacerbated by any activity. 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 I rec fusion L4-5, L5-S1 but her insurance co will only authorize surgery at L4-5.  Past Medical History  Diagnosis Date  . Depression   . Anxiety   . Fibromyalgia   . Arthritis   . Asthma   . Sleep apnea     cpap setting at 9  . Carpal tunnel syndrome, bilateral   . ADD (attention deficit disorder)   . Friction blisters of the palms     Uses Halobetasol    Past Surgical History  Procedure Laterality Date  . Hip surgery Right 2005    Hip replacement  . Laparoscopic gastric banding  2005  . Abdominal hysterectomy      1985  . Total hip arthroplasty Left 04/17/2013    Procedure: TOTAL HIP ARTHROPLASTY ANTERIOR APPROACH;  Surgeon: Mauri Pole, MD;  Location: [facility] ORS;  Service: Orthopedics;  Laterality: Left;  . Knee arthroscopy    . Tendon repair Right     forearm  . Eye surgery Left     Retinal surgery    Prior to Admission medications   Medication Sig Start Date End Date Taking? Authorizing Provider  albuterol (PROVENTIL HFA;VENTOLIN HFA) 108 (90 BASE) MCG/ACT inhaler Inhale 2 puffs into the lungs every 6 (six) hours as needed for shortness of breath.   Yes Historical Provider, MD  aspirin EC 325 MG tablet Take 1 tablet (325 mg total) by mouth 2 (two) times daily. 04/18/13  Yes Lucille Passy Babish, PA-C  buPROPion (WELLBUTRIN XL) 300 MG 24 hr tablet Take 300 mg by mouth at bedtime.   Yes Historical Provider, MD  diphenhydrAMINE (BENADRYL) 25 mg capsule Take 25-50 mg by mouth daily as needed for allergies.   Yes Historical Provider, MD  escitalopram  (LEXAPRO) 20 MG tablet Take 20 mg by mouth every morning.   Yes Historical Provider, MD  fentaNYL (DURAGESIC - DOSED MCG/HR) 100 MCG/HR Place 100 mcg onto the skin every 3 (three) days.   Yes Historical Provider, MD  ferrous sulfate 325 (65 FE) MG tablet Take 1 tablet (325 mg total) by mouth 3 (three) times daily after meals. 04/18/13  Yes Lucille Passy Babish, PA-C  halobetasol (ULTRAVATE) 0.05 % cream Apply 1 application topically daily as needed (for blisters).   Yes Historical Provider, MD  HYDROmorphone (DILAUDID) 4 MG tablet Take 4 mg by mouth every 6 (six) hours as needed for severe pain.   Yes Historical Provider, MD  LORazepam (ATIVAN) 1 MG tablet Take 1 mg by mouth at bedtime as needed for anxiety.   Yes Historical Provider, MD  METHYLPHENIDATE 18 MG PO CR tablet Take 18 mg by mouth daily. 11/13/14  Yes Historical Provider, MD  OVER THE COUNTER MEDICATION Take 2 each by mouth daily. [facility]   Yes Historical Provider, MD  oxyCODONE (OXY IR/ROXICODONE) 5 MG immediate release tablet Take 1-3 tablets (5-15 mg total) by mouth every 4 (four) hours as needed for pain. 04/18/13  Yes Lucille Passy Babish, PA-C  promethazine (PHENERGAN) 25 MG tablet Take 25 mg by mouth every 6 (six) hours as needed for  nausea.   Yes Historical Provider, MD  tiZANidine (ZANAFLEX) 4 MG tablet Take 4 mg by mouth 4 (four) times daily.   Yes Historical Provider, MD  docusate sodium 100 MG CAPS Take 100 mg by mouth 2 (two) times daily. Patient not taking: Reported on 12/03/2014 04/18/13   Lucille Passy Babish, PA-C  polyethylene glycol [facility] / GLYCOLAX) packet Take 17 g by mouth daily as needed. Patient not taking: Reported on 12/03/2014 04/18/13   Lucille Passy Babish, PA-C   No Known Allergies  History  Substance Use Topics  . Smoking status: Never Smoker   . Smokeless tobacco: Never Used  . Alcohol Use: No    History reviewed. No pertinent family history.   Review of Systems  Positive ROS: neg  All  other systems have been reviewed and were otherwise negative with the exception of those mentioned in the HPI and as above.  Objective: Vital signs in last 24 hours: Temp:  [98.1 F (36.7 C)] 98.1 F (36.7 C) (12/31 0616) Pulse Rate:  [85] 85 (12/31 0616) Resp:  [20] 20 (12/31 0616) BP: (129)/(86) 129/86 mmHg (12/31 0616) SpO2:  [97 %] 97 % (12/31 0616) Weight:  [181 lb 2 oz (82.158 kg)] 181 lb 2 oz (82.158 kg) (12/31 0616)  General Appearance: Alert, cooperative, no distress, appears stated age Head: Normocephalic, without obvious abnormality, atraumatic Eyes: PERRL, conjunctiva/corneas clear, EOM's intact    Neck: Supple, symmetrical, trachea midline Back: Symmetric, no curvature, ROM normal, no CVA tenderness Lungs:  respirations unlabored Heart: Regular rate and rhythm Abdomen: Soft, non-tender Extremities: Extremities normal, atraumatic, no cyanosis or edema Pulses: 2+ and symmetric all extremities Skin: Skin color, texture, turgor normal, no rashes or lesions  NEUROLOGIC:   Mental status: Alert and oriented x4,  no aphasia, good attention span, fund of knowledge, and memory Motor Exam - grossly normal Sensory Exam - grossly normal Reflexes: 1+ Coordination - grossly normal Gait - grossly normal Balance - grossly normal Cranial Nerves: I: smell Not tested  II: visual acuity  OS: nl    OD: nl  II: visual fields Full to confrontation  II: pupils Equal, round, reactive to light  III,VII: ptosis None  III,IV,VI: extraocular muscles  Full ROM  V: mastication Normal  V: facial light touch sensation  Normal  V,VII: corneal reflex  Present  VII: facial muscle function - upper  Normal  VII: facial muscle function - lower Normal  VIII: hearing Not tested  IX: soft palate elevation  Normal  IX,X: gag reflex Present  XI: trapezius strength  5/5  XI: sternocleidomastoid strength 5/5  XI: neck flexion strength  5/5  XII: tongue strength  Normal    Data Review Lab  Results  Component Value Date   WBC 7.0 12/19/2014   HGB 13.2 12/19/2014   HCT 38.7 12/19/2014   MCV 83.8 12/19/2014   PLT 222 12/19/2014   Lab Results  Component Value Date   NA 139 12/19/2014   K 4.0 12/19/2014   CL 103 12/19/2014   CO2 27 12/19/2014   BUN 8 12/19/2014   CREATININE 0.98 12/19/2014   GLUCOSE 105* 12/19/2014   Lab Results  Component Value Date   INR 0.93 12/03/2014    Assessment/Plan: Patient admitted for PLIF l4-5. Patient has failed a reasonable attempt at conservative therapy.  I explained the condition and procedure to the patient and answered any questions.  Patient wishes to proceed with procedure as planned. Understands risks/ benefits and typical outcomes of procedure.  Tanav Orsak S 12/26/2014 6:49 AM

## 2014-12-26 NOTE — Anesthesia Procedure Notes (Signed)
Procedure Name: Intubation Date/Time: 12/26/2014 7:44 AM Performed by: Kyung Rudd Pre-anesthesia Checklist: Patient identified, Emergency Drugs available, Suction available, Patient being monitored and Timeout performed Patient Re-evaluated:Patient Re-evaluated prior to inductionOxygen Delivery Method: Circle system utilized Preoxygenation: Pre-oxygenation with 100% oxygen Intubation Type: IV induction Ventilation: Mask ventilation without difficulty Tube type: Oral Tube size: 7.0 mm Number of attempts: 1 Airway Equipment and Method: Video-laryngoscopy and Stylet Placement Confirmation: ETT inserted through vocal cords under direct vision,  positive ETCO2 and breath sounds checked- equal and bilateral Secured at: 22 cm Tube secured with: Tape Dental Injury: Teeth and Oropharynx as per pre-operative assessment  Comments: Electively used Glidescope. Grade I view on glidescope screen. AOI. +ETCO2 & BBS=.

## 2014-12-26 NOTE — Transfer of Care (Signed)
Immediate Anesthesia Transfer of Care Note  Patient: Abigail Huerta  Procedure(s) Performed: Procedure(s) with comments: FOR MAXIMUM ACCESS (MAS) POSTERIOR LUMBAR INTERBODY FUSION (PLIF) 1 LEVEL (N/A) - FOR MAXIMUM ACCESS (MAS) POSTERIOR LUMBAR INTERBODY FUSION (PLIF) 1 LEVEL LUMBAR 4-5  Patient Location: PACU  Anesthesia Type:General  Level of Consciousness: awake, alert  and oriented  Airway & Oxygen Therapy: Patient Spontanous Breathing and Patient connected to face mask  Post-op Assessment: Report given to PACU RN, Post -op Vital signs reviewed and stable and Patient moving all extremities X 4  Post vital signs: Reviewed and stable  Complications: No apparent anesthesia complications

## 2014-12-26 NOTE — Op Note (Signed)
12/26/2014  10:58 AM  PATIENT:  Abigail Huerta  58 y.o. female  PRE-OPERATIVE DIAGNOSIS:  Spondylolisthesis with stenosis L4-5 with chronic back and leg pain  POST-OPERATIVE DIAGNOSIS:  Same  PROCEDURE:   1. Decompressive lumbar laminectomy L4-5 requiring more work than would be required for a simple exposure of the disk for PLIF in order to adequately decompress the neural elements and address the spinal stenosis 2. Posterior lumbar interbody fusion L4-5 using PEEK interbody cages packed with morcellized allograft and autograft 3. Posterior fixation L4-5 using cortical pedicle screws.   SURGEON:  Sherley Bounds, MD  ASSISTANTS: Dr. Annette Stable  ANESTHESIA:  General  EBL: 350 ml  Total I/O In: 1250 [I.V.:1000; IV Piggyback:250] Out: 425 [Urine:75; Blood:350]  BLOOD ADMINISTERED:none  DRAINS: Hemovac   INDICATION FOR PROCEDURE: This patient presented with a very long history of chronic back pain with leg pain. She had imaging which showed a mobile spondylolisthesis at L4-5 with stenosis. He had significant disease at L5-S1. I tried to offer a posterior lumbar interbody fusion at L4-5 and L5-S1 but her insurance company would only authorize L4-5. This was despite a [employer]. Therefore we decided to move forward with surgery only at L4-5 in the hopes that this would benefit her. Patient understood the risks, benefits, and alternatives and potential outcomes and wished to proceed.  PROCEDURE DETAILS:  The patient was brought to the operating room. After induction of generalized endotracheal anesthesia the patient was rolled into the prone position on chest rolls and all pressure points were padded. The patient's lumbar region was cleaned and then prepped with DuraPrep and draped in the usual sterile fashion. Anesthesia was injected and then a dorsal midline incision was made and carried down to the lumbosacral fascia. The fascia was opened and the paraspinous musculature was  taken down in a subperiosteal fashion to expose L4-5. A self-retaining retractor was placed. Intraoperative fluoroscopy confirmed my level, and I started with placement of the L4 cortical pedicle screws. The pedicle screw entry zones were identified utilizing surface landmarks and  AP and lateral fluoroscopy. I scored the cortex with the high-speed drill and then used the hand drill and EMG monitoring to drill an upward and outward direction into the pedicle. I then tapped line to line, and the tap was also monitored. I then placed a 5-0 x 35 mm cortical pedicle screw into the pedicles of L4 bilaterally. I then turned my attention to the decompression and the spinous process was removed and complete lumbar laminectomies, hemi- facetectomies, and foraminotomies were performed at L4-5. The patient had significant spinal stenosis and this required more work than would be required for a simple exposure of the disc for posterior lumbar interbody fusion. Much more generous decompression was undertaken in order to adequately decompress the neural elements and address the patient's leg pain. The yellow ligament was removed to expose the underlying dura and nerve roots, and generous foraminotomies were performed to adequately decompress the neural elements. Both the exiting and traversing nerve roots were decompressed on both sides until a coronary dilator passed easily along the nerve roots. Once the decompression was complete, I turned my attention to the posterior lower lumbar interbody fusion. The epidural venous vasculature was coagulated and cut sharply. Disc space was incised and the initial discectomy was performed with pituitary rongeurs. The disc space was distracted with sequential distractors to a height of 10 mm. We then used a series of scrapers and shavers to prepare the endplates for fusion. The midline  was prepared with Epstein curettes. Once the complete discectomy was finished, we packed an appropriate  sized peek interbody cage with local autograft and morcellized allograft, gently retracted the nerve root, and tapped the cage into position at L4-5.  The midline between the cages was packed with morselized autograft and allograft. We then turned our attention to the placement of the lower pedicle screws. The pedicle screw entry zones were identified utilizing surface landmarks and fluoroscopy. I drilled into each pedicle utilizing the hand drill and EMG monitoring, and tapped each pedicle with the appropriate tap. We palpated with a ball probe to assure no break in the cortex. We then placed 5-0 x 35 mm cortical pedicle screws into the pedicles bilaterally at L5. . We then placed lordotic rods into the multiaxial screw heads of the pedicle screws and locked these in position with the locking caps and anti-torque device. We then checked our construct with AP and lateral fluoroscopy. Irrigated with copious amounts of bacitracin-containing saline solution. Placed a medium Hemovac drain through separate stab incision. Inspected the nerve roots once again to assure adequate decompression, lined to the dura with Gelfoam, and closed the muscle and the fascia with 0 Vicryl. Closed the subcutaneous tissues with 2-0 Vicryl and subcuticular tissues with 3-0 Vicryl. The skin was closed with benzoin and Steri-Strips. Dressing was then applied, the patient was awakened from general anesthesia and transported to the recovery room in stable condition. At the end of the procedure all sponge, needle and instrument counts were correct.   PLAN OF CARE: Admit to inpatient   PATIENT DISPOSITION:  PACU - hemodynamically stable.   Delay start of Pharmacological VTE agent (>24hrs) due to surgical blood loss or risk of bleeding:  yes

## 2014-12-26 NOTE — Addendum Note (Signed)
Addendum  created 12/26/14 1311 by Kyung Rudd, CRNA   Modules edited: Anesthesia Events, Narrator   Narrator:  Narrator: Event Log Edited

## 2014-12-26 NOTE — Evaluation (Signed)
Physical Therapy Evaluation Patient Details Name: Abigail Huerta MRN: [id number] DOB: [birth year] Today's Date: 12/26/2014   History of Present Illness  Patient is a 58 y/o female s/p L4-5 PLIF. PMH of depression, anxiety, fibromyalgia and carpel tunnel syndrome.  Clinical Impression  Patient presents with pain, lethargy and balance deficits s/p above surgery impacting safe mobility. Tolerated short distance ambulation to bathroom however pt impulsive and required max cues for safety and to keep eyes open. Education provided on back precautions and pt only able to verbalize 2/3 at end of session with cues. Pt would benefit from skilled PT to improve transfers, gait, balance and mobility so pt can maximize independence and return to PLOF.    Follow Up Recommendations Home health PT;Supervision/Assistance - 24 hour    Equipment Recommendations  None recommended by PT    Recommendations for Other Services       Precautions / Restrictions Precautions Precautions: Back Precaution Booklet Issued: No Precaution Comments: reviewed back precautions. Required Braces or Orthoses: Spinal Brace Spinal Brace: Lumbar corset;Applied in sitting position Restrictions Weight Bearing Restrictions: No      Mobility  Bed Mobility Overal bed mobility: Needs Assistance Bed Mobility: Rolling;Sidelying to Sit;Sit to Sidelying Rolling: Min guard Sidelying to sit: Min guard     Sit to sidelying: Min guard General bed mobility comments: HOB flat, use of rails for support. Cues for log roll technique.  Transfers Overall transfer level: Needs assistance Equipment used: Rolling walker (2 wheeled) Transfers: Sit to/from Stand Sit to Stand: Min assist         General transfer comment: Min A to rise. Cues for hand placement as pt reaching to pull down on RW. Stood from [employer], from toilet x1 using grab bar. Constant reminders to adhere to back precautions.  Ambulation/Gait Ambulation/Gait  assistance: Min assist Ambulation Distance (Feet): 20 Feet (x2 bouts) Assistive device: Rolling walker (2 wheeled) Gait Pattern/deviations: Step-through pattern;Decreased stride length;Narrow base of support Gait velocity: slow Gait velocity interpretation: Below normal speed for age/gender General Gait Details: Pt with slow, unsteady gait. Impulsive requiring cues to not get tangled in lines and to put BUEs on RW. Lethargic with cues to keep eyes opened.   Stairs            Wheelchair Mobility    Modified Rankin (Stroke Patients Only)       Balance Overall balance assessment: Needs assistance Sitting-balance support: Feet supported;Single extremity supported Sitting balance-Leahy Scale: Fair Sitting balance - Comments: Pt requiring BUE support on bed due to pain, able to assist with donning LSo wtihout UE support for short period.   Standing balance support: During functional activity Standing balance-Leahy Scale: Poor Standing balance comment: Requires BUE support on RW for balance/safety.                             Pertinent Vitals/Pain Pain Assessment: 0-10 Pain Score:  (not rated on pain scale.) Pain Location: back Pain Descriptors / Indicators: Sore;Aching Pain Intervention(s): Monitored during session;Repositioned    Home Living Family/patient expects to be discharged to:: Private residence Living Arrangements: Spouse/significant other Available Help at Discharge: Family;Available 24 hours/day Type of Home: House Home Access: Ramped entrance     Home Layout: Able to live on main level with bedroom/bathroom Home Equipment: Walker - 2 wheels;Bedside commode      Prior Function Level of Independence: Independent  Hand Dominance   Dominant Hand: Right    Extremity/Trunk Assessment   Upper Extremity Assessment: Defer to OT evaluation           Lower Extremity Assessment: Generalized weakness (Reports sensation WFL  BLEs.)         Communication   Communication: No difficulties  Cognition Arousal/Alertness: Lethargic Behavior During Therapy: Impulsive Overall Cognitive Status: Within Functional Limits for tasks assessed       Memory: Decreased recall of precautions              General Comments      Exercises        Assessment/Plan    PT Assessment Patient needs continued PT services  PT Diagnosis Difficulty walking;Generalized weakness;Acute pain   PT Problem List Pain;Decreased strength;Decreased activity tolerance;Decreased knowledge of use of DME;Decreased safety awareness;Decreased mobility;Decreased knowledge of precautions;Decreased balance  PT Treatment Interventions Balance training;Gait training;Patient/family education;Functional mobility training;Therapeutic activities;Therapeutic exercise;Neuromuscular re-education;DME instruction   PT Goals (Current goals can be found in the Care Plan section) Acute Rehab PT Goals Patient Stated Goal: none stated PT Goal Formulation: With patient Time For Goal Achievement: 01/09/15 Potential to Achieve Goals: Fair    Frequency Min 5X/week   Barriers to discharge        Co-evaluation               End of Session Equipment Utilized During Treatment: Gait belt;Back brace Activity Tolerance: Patient limited by lethargy Patient left: in bed;with call bell/phone within reach;with bed alarm set;with family/visitor present Nurse Communication: Mobility status;Precautions         Time: 7510-2585 PT Time Calculation (min) (ACUTE ONLY): 29 min   Charges:   PT Evaluation $Initial PT Evaluation Tier I: 1 Procedure PT Treatments $Gait Training: 8-22 mins   PT G CodesCandy Sledge A 16-Jan-2015, 4:10 PM  Candy Sledge, [place], DPT [phone]

## 2014-12-26 NOTE — Anesthesia Postprocedure Evaluation (Signed)
  Anesthesia Post-op Note  Patient: Abigail Huerta  Procedure(s) Performed: Procedure(s) with comments: FOR MAXIMUM ACCESS (MAS) POSTERIOR LUMBAR INTERBODY FUSION (PLIF) 1 LEVEL (N/A) - FOR MAXIMUM ACCESS (MAS) POSTERIOR LUMBAR INTERBODY FUSION (PLIF) 1 LEVEL LUMBAR 4-5  Patient Location: PACU  Anesthesia Type:General  Level of Consciousness: awake and alert   Airway and Oxygen Therapy: Patient Spontanous Breathing  Post-op Pain: moderate  Post-op Assessment: Post-op Vital signs reviewed  Post-op Vital Signs: Reviewed  Last Vitals:  Filed Vitals:   12/26/14 1244  BP: 118/71  Pulse: 68  Temp: 36.7 C  Resp: 17    Complications: No apparent anesthesia complications

## 2014-12-26 NOTE — Anesthesia Preprocedure Evaluation (Addendum)
Anesthesia Evaluation  Patient identified by MRN, date of birth, ID band Patient awake    Reviewed: Allergy & Precautions, H&P , NPO status , Patient's Chart, lab work & pertinent test results  Airway Mallampati: III  TM Distance: >3 FB Neck ROM: Full    Dental  (+) Partial Upper   Pulmonary asthma , sleep apnea and Continuous Positive Airway Pressure Ventilation ,          Cardiovascular negative cardio ROS      Neuro/Psych Anxiety Depression negative neurological ROS     GI/Hepatic negative GI ROS, Neg liver ROS,   Endo/Other  negative endocrine ROS  Renal/GU negative Renal ROS     Musculoskeletal  (+) Arthritis -, Fibromyalgia -  Abdominal   Peds  Hematology negative hematology ROS (+)   Anesthesia Other Findings   Reproductive/Obstetrics                           Anesthesia Physical Anesthesia Plan  ASA: II  Anesthesia Plan: General   Post-op Pain Management:    Induction: Intravenous  Airway Management Planned: Oral ETT  Additional Equipment:   Intra-op Plan:   Post-operative Plan: Extubation in OR  Informed Consent: I have reviewed the patients History and Physical, chart, labs and discussed the procedure including the risks, benefits and alternatives for the proposed anesthesia with the patient or authorized representative who has indicated his/her understanding and acceptance.   Dental advisory given  Plan Discussed with: CRNA and Surgeon  Anesthesia Plan Comments:         Anesthesia Quick Evaluation

## 2014-12-27 NOTE — Discharge Summary (Signed)
Physician Discharge Summary  Patient ID: Abigail Huerta MRN: [id number] DOB/AGE: 29-Jul-1956 59 y.o.  Admit date: 12/26/2014 Discharge date: 12/27/2014  Admission Diagnoses: spondylolisthesis/ stenosis l4-5    Discharge Diagnoses: same   Discharged Condition: good  Hospital Course: The patient was admitted on 12/26/2014 and taken to the operating room where the patient underwent PLIF L4-5. The patient tolerated the procedure well and was taken to the recovery room and then to the floor in stable condition. The hospital course was routine. There were no complications. The wound remained clean dry and intact. Pt had appropriate back soreness. No complaints of leg pain or new N/T/W. The patient remained afebrile with stable vital signs, and tolerated a regular diet. The patient continued to increase activities, and pain was well controlled with oral pain medications.   Consults: None  Significant Diagnostic Studies:  Results for orders placed or performed during the hospital encounter of 87/86/76  Basic metabolic panel  Result Value Ref Range   Sodium 139 135 - 145 mmol/L   Potassium 4.0 3.5 - 5.1 mmol/L   Chloride 103 96 - 112 mEq/L   CO2 27 19 - 32 mmol/L   Glucose, Bld 105 (H) 70 - 99 mg/dL   BUN 8 6 - 23 mg/dL   Creatinine, Ser 0.98 0.50 - 1.10 mg/dL   Calcium 9.2 8.4 - 10.5 mg/dL   GFR calc non Af Amer 61 (L) >90 mL/min   GFR calc Af Amer 71 (L) >90 mL/min   Anion gap 9 5 - 15  CBC  Result Value Ref Range   WBC 7.0 4.0 - 10.5 K/uL   RBC 4.62 3.87 - 5.11 MIL/uL   Hemoglobin 13.2 12.0 - 15.0 g/dL   HCT 38.7 36.0 - 46.0 %   MCV 83.8 78.0 - 100.0 fL   MCH 28.6 26.0 - 34.0 pg   MCHC 34.1 30.0 - 36.0 g/dL   RDW 13.0 11.5 - 15.5 %   Platelets 222 150 - 400 K/uL  Type and screen  Result Value Ref Range   ABO/RH(D) O POS    Antibody Screen NEG    Sample Expiration 01/02/2015     Chest 2 View  12/03/2014   CLINICAL DATA:  Asthma, sleep apnea, lumbar spondylolisthesis, preop  posterior interbody lumbar fusion  EXAM: CHEST  2 VIEW  COMPARISON:  09/17/2004  FINDINGS: Cardiomediastinal silhouette is stable. No acute infiltrate or pleural effusion. No pulmonary edema. Bony thorax is unremarkable.  IMPRESSION: No active cardiopulmonary disease.   Electronically Signed   By: Lahoma Crocker M.D.   On: 12/03/2014 13:59   Dg Lumbar Spine 2-3 Views  12/26/2014   CLINICAL DATA:  L4-L5 fusion .  EXAM: DG C-ARM GT 120 MIN; LUMBAR SPINE - 2-3 VIEW  FLUOROSCOPY TIME:  1 min 20 seconds.  COMPARISON:  11/05/2014.  FINDINGS: Lumbar vertebral bodies are numbered with the lowest appearing fully segmented vertebral body as L5. Prior posterior and interbody L4-L5 fusion. Minimal stable anterolisthesis L4-L5. Surgical hardware intact.  IMPRESSION: L4-L5 posterior and interbody fusion.   Electronically Signed   By: [place]   On: 12/26/2014 10:46   Dg C-arm Gt 120 Min  12/26/2014   CLINICAL DATA:  L4-L5 fusion .  EXAM: DG C-ARM GT 120 MIN; LUMBAR SPINE - 2-3 VIEW  FLUOROSCOPY TIME:  1 min 20 seconds.  COMPARISON:  11/05/2014.  FINDINGS: Lumbar vertebral bodies are numbered with the lowest appearing fully segmented vertebral body as L5. Prior posterior and interbody L4-L5  fusion. Minimal stable anterolisthesis L4-L5. Surgical hardware intact.  IMPRESSION: L4-L5 posterior and interbody fusion.   Electronically Signed   By: Marcello Moores  Register   On: 12/26/2014 10:46    Antibiotics:  Anti-infectives    Start     Dose/Rate Route Frequency Ordered Stop   12/26/14 1600  ceFAZolin (ANCEF) IVPB 1 g/50 mL premix     1 g100 mL/hr over 30 Minutes Intravenous Every 8 hours 12/26/14 1117 12/27/14 0109   12/26/14 0818  bacitracin 50,000 Units in sodium chloride irrigation 0.9 % 500 mL irrigation  Status:  Discontinued       As needed 12/26/14 0818 12/26/14 1055   12/26/14 0600  ceFAZolin (ANCEF) IVPB 2 g/50 mL premix     2 g100 mL/hr over 30 Minutes Intravenous On call to O.R. 12/25/14 1328 12/26/14 0800       Discharge Exam: Blood pressure 101/53, pulse 81, temperature 97.8 F (36.6 C), temperature source Oral, resp. rate 18, height 5\' 7"  (1.702 m), weight 181 lb 2 oz (82.158 kg), SpO2 97 %. Neurologic: Grossly normal Incision CDI  Discharge Medications:     Medication List    TAKE these medications        albuterol 108 (90 BASE) MCG/ACT inhaler  Commonly known as:  PROVENTIL HFA;VENTOLIN HFA  Inhale 2 puffs into the lungs every 6 (six) hours as needed for shortness of breath.     aspirin EC 325 MG tablet  Take 1 tablet (325 mg total) by mouth 2 (two) times daily.     buPROPion 300 MG 24 hr tablet  Commonly known as:  WELLBUTRIN XL  Take 300 mg by mouth at bedtime.     diphenhydrAMINE 25 mg capsule  Commonly known as:  BENADRYL  Take 25-50 mg by mouth daily as needed for allergies.     DSS 100 MG Caps  Take 100 mg by mouth 2 (two) times daily.     escitalopram 20 MG tablet  Commonly known as:  LEXAPRO  Take 20 mg by mouth every morning.     fentaNYL 100 MCG/HR  Commonly known as:  DURAGESIC - dosed mcg/hr  Place 100 mcg onto the skin every 3 (three) days.     ferrous sulfate 325 (65 FE) MG tablet  Take 1 tablet (325 mg total) by mouth 3 (three) times daily after meals.     halobetasol 0.05 % cream  Commonly known as:  ULTRAVATE  Apply 1 application topically daily as needed (for blisters).     HYDROmorphone 4 MG tablet  Commonly known as:  DILAUDID  Take 4 mg by mouth every 6 (six) hours as needed for severe pain.     LORazepam 1 MG tablet  Commonly known as:  ATIVAN  Take 1 mg by mouth at bedtime as needed for anxiety.     methylphenidate 18 MG CR tablet  Commonly known as:  CONCERTA  Take 18 mg by mouth daily.     OVER THE COUNTER MEDICATION  Take 2 each by mouth daily. [employer] [facility]     oxyCODONE 5 MG immediate release tablet  Commonly known as:  Oxy IR/ROXICODONE  Take 1-3 tablets (5-15 mg total) by mouth every 4 (four) hours as needed for  pain.     polyethylene glycol packet  Commonly known as:  MIRALAX / GLYCOLAX  Take 17 g by mouth daily as needed.     promethazine 25 MG tablet  Commonly known as:  PHENERGAN  Take 25 mg  by mouth every 6 (six) hours as needed for nausea.     tiZANidine 4 MG tablet  Commonly known as:  ZANAFLEX  Take 4 mg by mouth 4 (four) times daily.        Disposition: home   Final Dx: PLIF L4-5      Discharge Instructions     Remove dressing in 72 hours    Complete by:  As directed      Call MD for:  difficulty breathing, headache or visual disturbances    Complete by:  As directed      Call MD for:  persistant nausea and vomiting    Complete by:  As directed      Call MD for:  redness, tenderness, or signs of infection (pain, swelling, redness, odor or green/yellow discharge around incision site)    Complete by:  As directed      Call MD for:  severe uncontrolled pain    Complete by:  As directed      Call MD for:  temperature >100.4    Complete by:  As directed      Diet - low sodium heart healthy    Complete by:  As directed      Discharge instructions    Complete by:  As directed   May shower, no driving, no bending or twisting, no heavy lifting, no strenuous activity, walk twice per day.     Increase activity slowly    Complete by:  As directed               Signed: Manila Rommel S 12/27/2014, 10:13 AM

## 2014-12-27 NOTE — Progress Notes (Signed)
OT Cancellation Note  Patient Details Name: Abigail Huerta MRN: [id number] DOB: 1956/07/02   Cancelled Treatment:    Reason Eval/Treat Not Completed: Other (comment) (Pt discharged prior to OT eval )  Riad Wagley, [place], OTR/L [phone]  12/27/2014, 12:41 PM

## 2014-12-27 NOTE — Progress Notes (Signed)
Hemovac removed per MD order, pt tolerated well.  Will continue to monitor. Cori Razor, RN

## 2014-12-27 NOTE — Progress Notes (Signed)
Physical Therapy Treatment Patient Details Name: Abigail Huerta MRN: [id number] DOB: November 15, 1956 Today's Date: 12/27/2014    History of Present Illness Patient is a 59 y/o female s/p L4-5 PLIF. PMH of depression, anxiety, fibromyalgia and carpel tunnel syndrome.    PT Comments    Patient progressing well with mobility. More alert and awake today. Tolerated ambulation without use of RW for support. No LOB. Impulsive during all mobility and requires constant cues to adhere to back precautions. Able to verbalize 2/3 back precautions. Will continue to follow acutely.   Follow Up Recommendations  Home health PT;Supervision/Assistance - 24 hour     Equipment Recommendations  None recommended by PT    Recommendations for Other Services       Precautions / Restrictions Precautions Precautions: Back Precaution Comments: Pt able to verbalize 2/3 back precautions with cues. Required Braces or Orthoses: Spinal Brace Spinal Brace: Lumbar corset;Applied in sitting position Restrictions Weight Bearing Restrictions: No    Mobility  Bed Mobility Overal bed mobility: Needs Assistance Bed Mobility: Rolling;Sidelying to Sit;Sit to Supine Rolling: Supervision Sidelying to sit: Supervision   Sit to supine: Supervision   General bed mobility comments: HOB flat, use of rails for support. Cues for log roll technique. Pt impulsively trying to return to supine while getting into long sitting and trying to doff LSO/gown while in awkward position. Cues to be aware of positioning and precautions during tasks.  Transfers Overall transfer level: Needs assistance Equipment used: None Transfers: Sit to/from Stand Sit to Stand: Supervision         General transfer comment: Supervision for safety. Constant cues to adhere to back precautions.  Ambulation/Gait Ambulation/Gait assistance: Supervision Ambulation Distance (Feet): 150 Feet Assistive device: None Gait Pattern/deviations: Step-through  pattern;Decreased stride length Gait velocity: slow   General Gait Details: Pt with slow, steady gait. Cues for upright posture and to adhere to back precautions during gait. Impulsive. After getting back in room and doffing LSO, pt getting up to walk in room wtihout brace, cues to donn brace if ambulating.   Stairs            Wheelchair Mobility    Modified Rankin (Stroke Patients Only)       Balance Overall balance assessment: Needs assistance Sitting-balance support: Feet supported;No upper extremity supported Sitting balance-Leahy Scale: Good     Standing balance support: During functional activity Standing balance-Leahy Scale: Fair                      Cognition Arousal/Alertness: Awake/alert Behavior During Therapy: Impulsive Overall Cognitive Status: Within Functional Limits for tasks assessed       Memory: Decreased recall of precautions              Exercises      General Comments        Pertinent Vitals/Pain Pain Assessment: No/denies pain    Home Living                      Prior Function            PT Goals (current goals can now be found in the care plan section) Progress towards PT goals: Progressing toward goals    Frequency  Min 5X/week    PT Plan Current plan remains appropriate    Co-evaluation             End of Session Equipment Utilized During Treatment: Gait belt;Back brace Activity Tolerance: Patient tolerated treatment  well Patient left: in bed;with call bell/phone within reach;with family/visitor present;with nursing/sitter in room     Time: 4239-5320 PT Time Calculation (min) (ACUTE ONLY): 23 min  Charges:  $Gait Training: 8-22 mins $Therapeutic Activity: 8-22 mins                    G CodesCandy Huerta A 10-Jan-2015, 12:41 PM Abigail Huerta, [place], DPT [phone]

## 2014-12-27 NOTE — Progress Notes (Signed)
Discharge orders received.  Discharge instructions and follow-up appointments reviewed with the patient and her husband.  VSS upon discharge.  IV removed and education complete.  Transported out via wheelchair husband present. Cori Razor, RN

## 2014-12-27 NOTE — Progress Notes (Signed)
Pt set-up and placed on CPAP (11cm H2O) w/ 2 lpm O2 bleed-in via Pt's personal FFM.  Pt currently tolerating well.  RT to monitor as needed.

## 2014-12-30 ENCOUNTER — Encounter (HOSPITAL_COMMUNITY): Payer: Self-pay | Admitting: Neurological Surgery

## 2015-01-05 IMAGING — CR DG CHEST 2V
2 series · 2 of 2 positions shown · non-contrast
Comparison: 09/17/2004

CLINICAL DATA: Asthma, sleep apnea, lumbar spondylolisthesis, preop
posterior interbody lumbar fusion

EXAM:
CHEST  2 VIEW

[w chest pa]
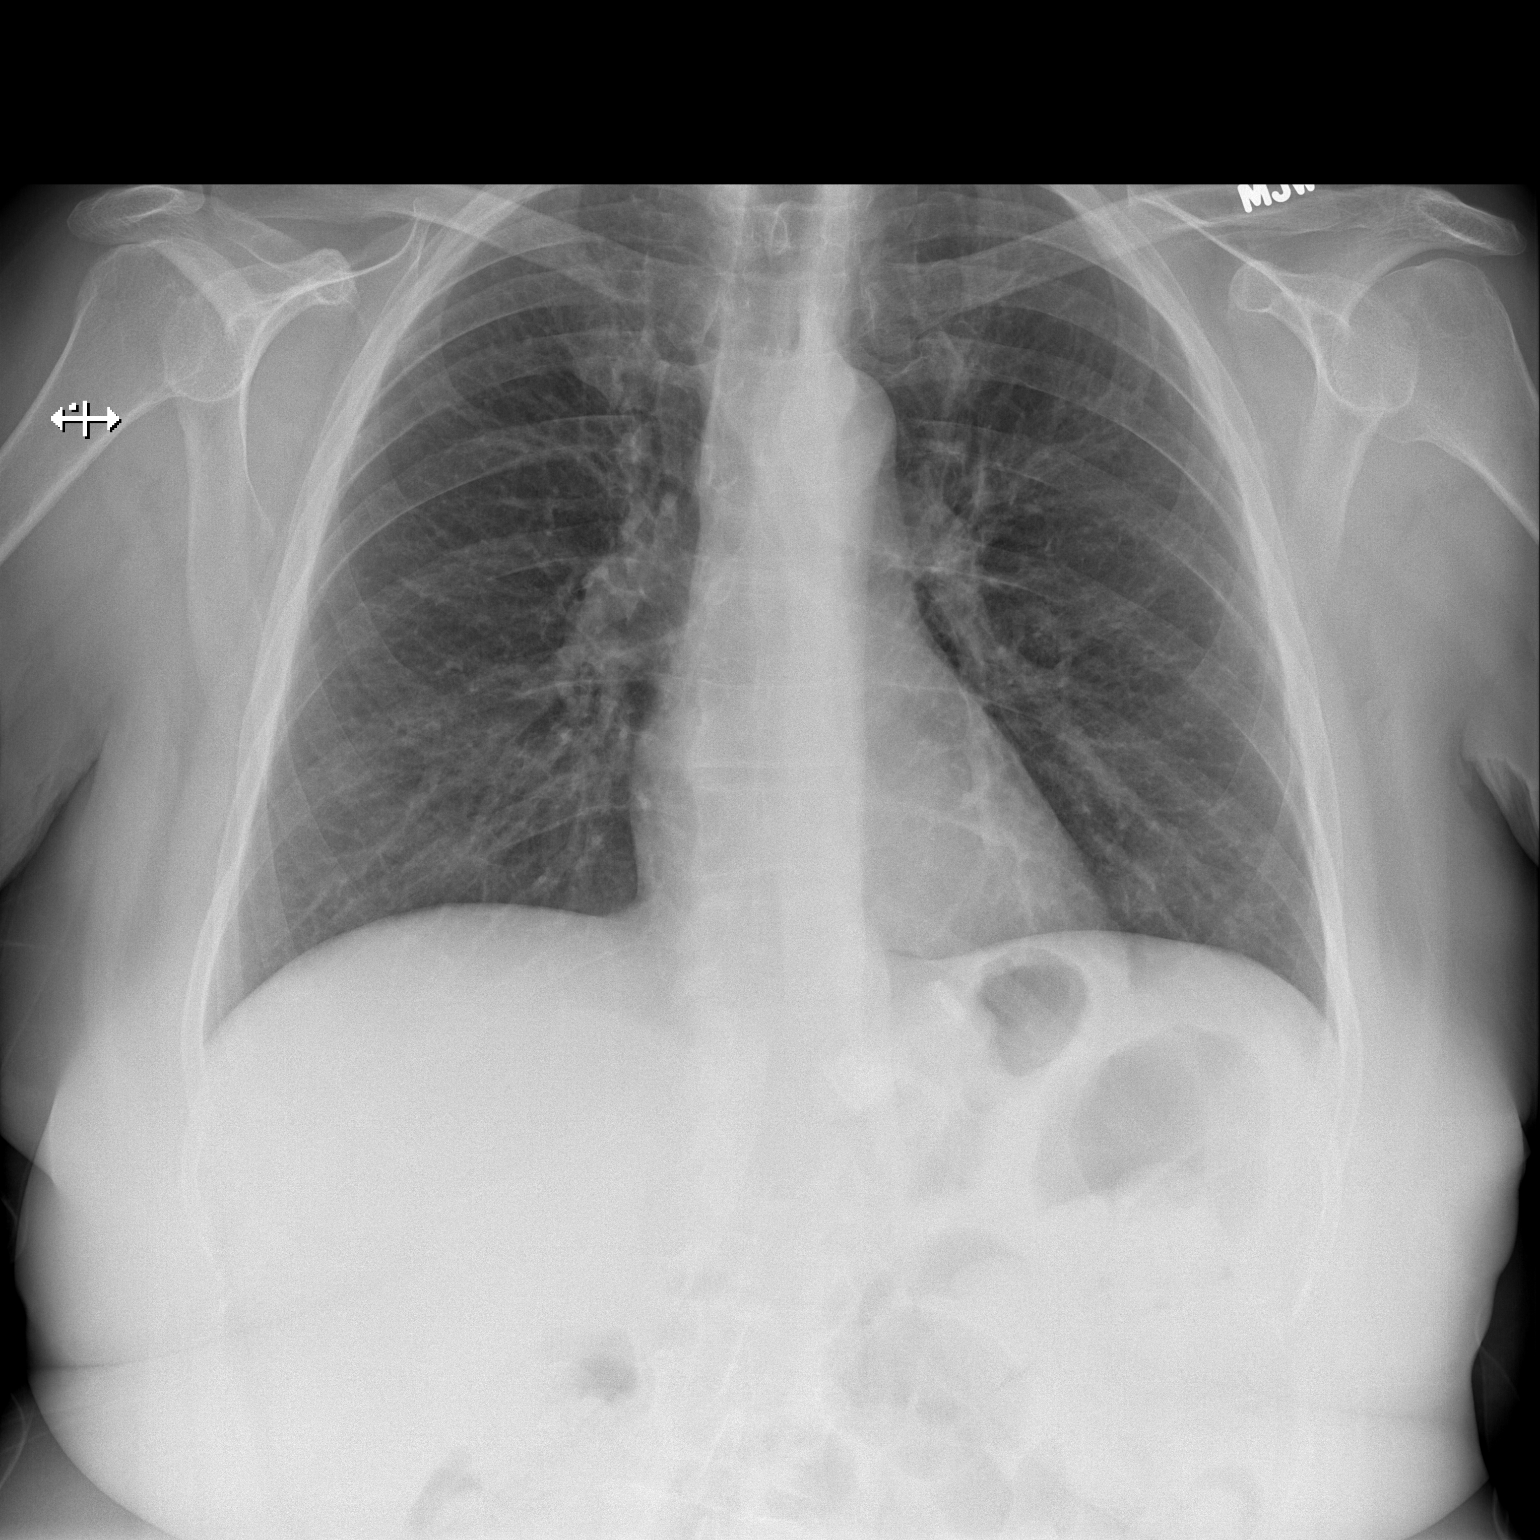

[w chest lat]
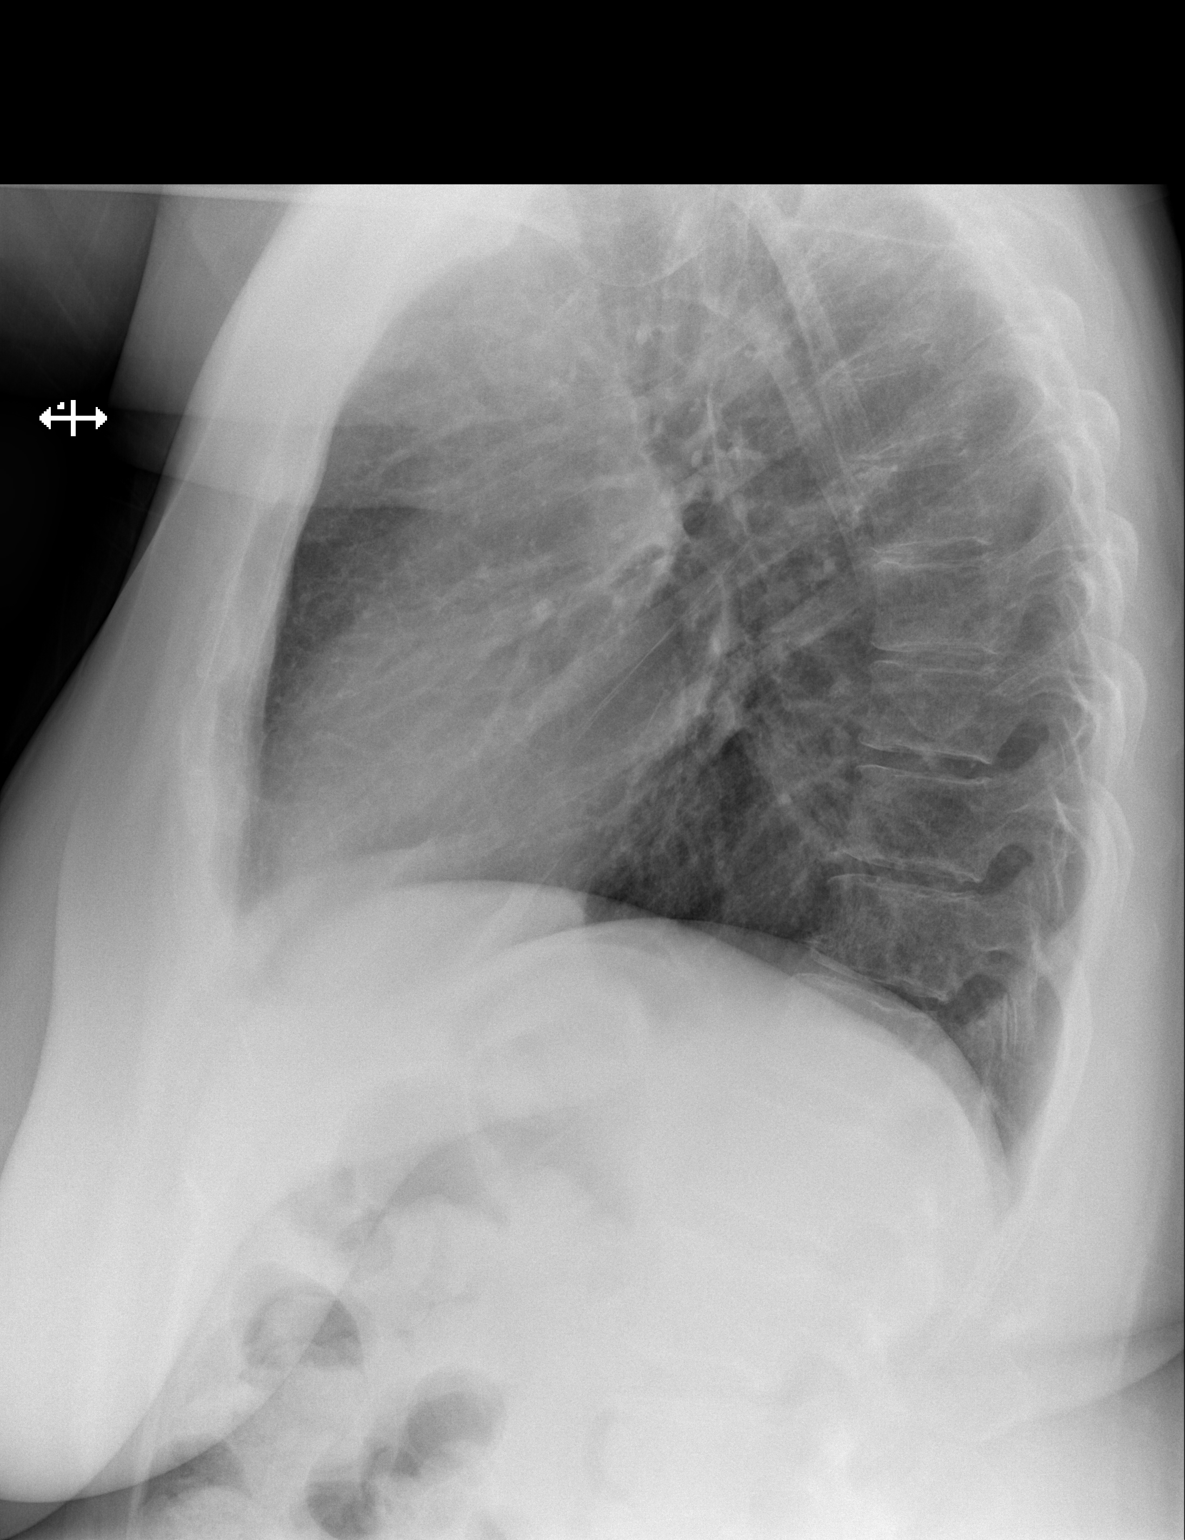

[2 of 2 positions shown; findings below may reference images not displayed]

FINDINGS: Cardiomediastinal silhouette is stable. No acute infiltrate or
pleural effusion. No pulmonary edema. Bony thorax is unremarkable.
IMPRESSION: No active cardiopulmonary disease.

## 2015-02-11 DIAGNOSIS — Z6828 Body mass index (BMI) 28.0-28.9, adult: Secondary | ICD-10-CM | POA: Diagnosis not present

## 2015-02-11 DIAGNOSIS — M4316 Spondylolisthesis, lumbar region: Secondary | ICD-10-CM | POA: Diagnosis not present

## 2015-02-11 DIAGNOSIS — R030 Elevated blood-pressure reading, without diagnosis of hypertension: Secondary | ICD-10-CM | POA: Diagnosis not present

## 2015-04-22 DIAGNOSIS — F901 Attention-deficit hyperactivity disorder, predominantly hyperactive type: Secondary | ICD-10-CM | POA: Diagnosis not present

## 2015-05-16 DIAGNOSIS — M47814 Spondylosis without myelopathy or radiculopathy, thoracic region: Secondary | ICD-10-CM | POA: Diagnosis not present

## 2015-05-16 DIAGNOSIS — M5134 Other intervertebral disc degeneration, thoracic region: Secondary | ICD-10-CM | POA: Diagnosis not present

## 2015-05-20 DIAGNOSIS — M4316 Spondylolisthesis, lumbar region: Secondary | ICD-10-CM | POA: Diagnosis not present

## 2015-05-28 DIAGNOSIS — M791 Myalgia: Secondary | ICD-10-CM | POA: Diagnosis not present

## 2015-05-28 DIAGNOSIS — R5382 Chronic fatigue, unspecified: Secondary | ICD-10-CM | POA: Diagnosis not present

## 2015-08-21 ENCOUNTER — Other Ambulatory Visit: Payer: Self-pay | Admitting: Gastroenterology

## 2015-12-23 ENCOUNTER — Ambulatory Visit
Admission: RE | Admit: 2015-12-23 | Discharge: 2015-12-23 | Disposition: A | Discharge status: Home / Self Care | Payer: Medicare Other | Source: Ambulatory Visit | Attending: Nurse Practitioner | Admitting: Nurse Practitioner

## 2015-12-23 ENCOUNTER — Encounter (HOSPITAL_COMMUNITY): Payer: Self-pay | Admitting: *Deleted

## 2015-12-23 ENCOUNTER — Other Ambulatory Visit: Payer: Self-pay | Admitting: Nurse Practitioner

## 2015-12-23 DIAGNOSIS — J40 Bronchitis, not specified as acute or chronic: Secondary | ICD-10-CM

## 2015-12-23 NOTE — Progress Notes (Signed)
12-23-15 Pt going in to see MD for Bronchitis and not feeling well. May need to cancel this procedure.

## 2016-01-01 ENCOUNTER — Encounter (HOSPITAL_COMMUNITY): Payer: Self-pay | Admitting: *Deleted

## 2016-01-01 NOTE — Progress Notes (Signed)
01-01-16 1050 Spoke with pt today and she states has cancelled procedure  scheduled 01-05-16 with Dr. Wynetta Emery "is too sick".

## 2016-01-05 ENCOUNTER — Encounter (HOSPITAL_COMMUNITY): Payer: Self-pay | Admitting: Anesthesiology

## 2016-01-05 SURGERY — COLONOSCOPY WITH PROPOFOL
Anesthesia: Monitor Anesthesia Care

## 2016-01-06 ENCOUNTER — Ambulatory Visit (HOSPITAL_COMMUNITY): Admission: RE | Admit: 2016-01-06 | Payer: Medicare Other | Source: Ambulatory Visit | Admitting: Gastroenterology

## 2016-01-06 HISTORY — DX: Other chronic pain: G89.29

## 2016-01-06 HISTORY — DX: Bronchitis, not specified as acute or chronic: J40

## 2016-02-14 ENCOUNTER — Encounter (HOSPITAL_COMMUNITY): Payer: Self-pay | Admitting: *Deleted

## 2016-02-14 ENCOUNTER — Emergency Department (HOSPITAL_COMMUNITY)
Admission: EM | Admit: 2016-02-14 | Discharge: 2016-02-15 | Disposition: A | Discharge status: Home / Self Care | Payer: Medicare HMO | Attending: Emergency Medicine | Admitting: Emergency Medicine

## 2016-02-14 ENCOUNTER — Emergency Department (HOSPITAL_COMMUNITY): Payer: Medicare HMO

## 2016-02-14 DIAGNOSIS — J441 Chronic obstructive pulmonary disease with (acute) exacerbation: Secondary | ICD-10-CM | POA: Diagnosis not present

## 2016-02-14 DIAGNOSIS — M1990 Unspecified osteoarthritis, unspecified site: Secondary | ICD-10-CM | POA: Insufficient documentation

## 2016-02-14 DIAGNOSIS — G4730 Sleep apnea, unspecified: Secondary | ICD-10-CM | POA: Diagnosis not present

## 2016-02-14 DIAGNOSIS — F909 Attention-deficit hyperactivity disorder, unspecified type: Secondary | ICD-10-CM | POA: Diagnosis not present

## 2016-02-14 DIAGNOSIS — R05 Cough: Secondary | ICD-10-CM | POA: Diagnosis present

## 2016-02-14 DIAGNOSIS — F419 Anxiety disorder, unspecified: Secondary | ICD-10-CM | POA: Insufficient documentation

## 2016-02-14 DIAGNOSIS — F329 Major depressive disorder, single episode, unspecified: Secondary | ICD-10-CM | POA: Diagnosis not present

## 2016-02-14 DIAGNOSIS — Z9981 Dependence on supplemental oxygen: Secondary | ICD-10-CM | POA: Diagnosis not present

## 2016-02-14 DIAGNOSIS — G8929 Other chronic pain: Secondary | ICD-10-CM | POA: Insufficient documentation

## 2016-02-14 DIAGNOSIS — Z79899 Other long term (current) drug therapy: Secondary | ICD-10-CM | POA: Insufficient documentation

## 2016-02-14 MED ORDER — ACETAMINOPHEN 500 MG PO TABS
1000.0000 mg | ORAL_TABLET | Freq: Once | ORAL | Status: DC
  Filled 2016-02-14: qty 2

## 2016-02-14 MED ORDER — ALBUTEROL SULFATE (2.5 MG/3ML) 0.083% IN NEBU
5.0000 mg | INHALATION_SOLUTION | Freq: Once | RESPIRATORY_TRACT | Status: AC
  Administered 2016-02-14: 5 mg via RESPIRATORY_TRACT
  Filled 2016-02-14: qty 6

## 2016-02-14 MED ORDER — IBUPROFEN 200 MG PO TABS
600.0000 mg | ORAL_TABLET | Freq: Once | ORAL | Status: AC
  Administered 2016-02-14: 600 mg via ORAL
  Filled 2016-02-14: qty 3

## 2016-02-14 NOTE — ED Notes (Signed)
Pt states she has had cough, shortness of breath for the past 2 weeks. Pt states she just finished a z-pack and does not feel better. Pt states she had bronchitis in January, states she is using the leftover albuterol. Pt states she has green, yellow and brown sputum.

## 2016-02-14 NOTE — ED Provider Notes (Signed)
CSN: [id number]     Arrival date & time 02/14/16  2224 History  By signing my name below, I, [name], attest that this documentation has been prepared under the direction and in the presence of Elmor Kost, MD. Electronically Signed: [name], ED Scribe. 02/14/2016. 12:01 AM.   Chief Complaint  Patient presents with  . Cough  . Shortness of Breath   Patient is a 60 y.o. female presenting with wheezing. The history is provided by the patient. No language interpreter was used.  Wheezing Severity:  Moderate Severity compared to prior episodes:  Similar Onset quality:  Gradual Timing:  Constant Progression:  Worsening Chronicity:  Recurrent Context: not dust   Relieved by:  Nothing Worsened by:  Nothing tried Ineffective treatments:  Home nebulizer Associated symptoms: cough and shortness of breath   Associated symptoms: no chest pain, no fever and no PND   Risk factors: no smoke inhalation   HPI Comments: Abigail Huerta is a 60 y.o. Female with a hx of asthma, sleep apnea and bronchitis who presents to the Emergency Department complaining of gradually worsening productive cough with green, yellow, and brown sputum onset 2 weeks ago. She reports associated SOB, wheezing, and chest pain with coughing. Pt recently finished a z-pack to no relief. She states that she has been taking leftover albuterol from when she had bronchitis in January. She denies nasal congestion. She denies allergies to peanuts.  Past Medical History  Diagnosis Date  . Depression   . Anxiety   . Fibromyalgia   . Arthritis   . Asthma   . Sleep apnea     cpap setting at 9  . Carpal tunnel syndrome, bilateral   . ADD (attention deficit disorder)   . Friction blisters of the palms     Uses Halobetasol  . Bronchitis     12-23-15 being seen today for bronchitis, ? whether she will have Colonoscopy scheduled 01-05-16  . Chronic pain     "chronic pain-arthritis back and spine"   Past Surgical History   Procedure Laterality Date  . Hip surgery Right 2005    Hip replacement  . Laparoscopic gastric banding  2005  . Abdominal hysterectomy      1985  . Total hip arthroplasty Left 04/17/2013    Procedure: TOTAL HIP ARTHROPLASTY ANTERIOR APPROACH;  Surgeon: Mauri Pole, MD;  Location: [facility];  Service: Orthopedics;  Laterality: Left;  . Knee arthroscopy    . Tendon repair Right     forearm  . Eye surgery Left     Retinal surgery  . Maximum access (mas)posterior lumbar interbody fusion (plif) 1 level N/A 12/26/2014    Procedure: FOR MAXIMUM ACCESS (MAS) POSTERIOR LUMBAR INTERBODY FUSION (PLIF) 1 LEVEL;  Surgeon: Eustace Moore, MD;  Location: [facility];  Service: Neurosurgery;  Laterality: N/A;  FOR MAXIMUM ACCESS (MAS) POSTERIOR LUMBAR INTERBODY FUSION (PLIF) 1 LEVEL LUMBAR 4-5   No family history on file. Social History  Substance Use Topics  . Smoking status: Never Smoker   . Smokeless tobacco: Never Used  . Alcohol Use: No   OB History    No data available     Review of Systems  Constitutional: Negative for fever.  Respiratory: Positive for cough, shortness of breath and wheezing.   Cardiovascular: Negative for chest pain, palpitations, leg swelling and PND.  All other systems reviewed and are negative.  Allergies  Review of patient's allergies indicates no known allergies.  Home Medications   Prior  to Admission medications   Medication Sig Start Date End Date Taking? Authorizing Provider  albuterol (PROVENTIL HFA;VENTOLIN HFA) 108 (90 BASE) MCG/ACT inhaler Inhale 2 puffs into the lungs every 6 (six) hours as needed for shortness of breath.   Yes Historical Provider, MD  albuterol (PROVENTIL) (2.5 MG/3ML) 0.083% nebulizer solution Take 2.5 mg by nebulization every 6 (six) hours as needed for wheezing or shortness of breath.   Yes Historical Provider, MD  B Complex-C (B-COMPLEX WITH VITAMIN C) tablet Take 1 tablet by mouth daily.   Yes Historical Provider, MD  buPROPion  (WELLBUTRIN XL) 300 MG 24 hr tablet Take 300 mg by mouth daily.   Yes Historical Provider, MD  dextromethorphan-guaiFENesin (MUCINEX DM) 30-600 MG 12hr tablet Take 1 tablet by mouth 2 (two) times daily as needed for cough.   Yes Historical Provider, MD  diphenhydrAMINE (BENADRYL) 25 mg capsule Take 25-50 mg by mouth daily as needed for allergies.   Yes Historical Provider, MD  fentaNYL (DURAGESIC - DOSED MCG/HR) 100 MCG/HR Place 100 mcg onto the skin every other day. New patch applied 01/13/2016   Yes Historical Provider, MD  guaiFENesin (ROBITUSSIN) 100 MG/5ML liquid Take 200 mg by mouth 3 (three) times daily as needed for cough.   Yes Historical Provider, MD  oxyCODONE-acetaminophen (PERCOCET) 10-325 MG tablet Take 1 tablet by mouth every 6 (six) hours as needed for pain.   Yes Historical Provider, MD  promethazine (PHENERGAN) 25 MG tablet Take 25 mg by mouth every 6 (six) hours as needed for nausea.   Yes Historical Provider, MD  tiZANidine (ZANAFLEX) 4 MG tablet Take 4 mg by mouth 4 (four) times daily.   Yes Historical Provider, MD  aspirin EC 325 MG tablet Take 1 tablet (325 mg total) by mouth 2 (two) times daily. Patient not taking: Reported on 02/14/2016 04/18/13   Danae Orleans, PA-C  docusate sodium 100 MG CAPS Take 100 mg by mouth 2 (two) times daily. Patient not taking: Reported on 12/03/2014 04/18/13   Danae Orleans, PA-C  ferrous sulfate 325 (65 FE) MG tablet Take 1 tablet (325 mg total) by mouth 3 (three) times daily after meals. Patient not taking: Reported on 09/29/2015 04/18/13   Danae Orleans, PA-C  oxyCODONE (OXY IR/ROXICODONE) 5 MG immediate release tablet Take 1-3 tablets (5-15 mg total) by mouth every 4 (four) hours as needed for pain. Patient not taking: Reported on 09/29/2015 04/18/13   Danae Orleans, PA-C  polyethylene glycol (MIRALAX / Floria Raveling) packet Take 17 g by mouth daily as needed. Patient not taking: Reported on 02/14/2016 04/18/13   Danae Orleans, PA-C   BP 160/98 mmHg   Pulse 106  Temp(Src) 99.7 F (37.6 C) (Oral)  Resp 20  Ht 5\' 7"  (1.702 m)  Wt 195 lb (88.451 kg)  BMI 30.53 kg/m2  SpO2 98% Physical Exam  Constitutional: She is oriented to person, place, and time. She appears well-developed and well-nourished. No distress.  HENT:  Head: Normocephalic and atraumatic.  Mouth/Throat: Oropharynx is clear and moist. No oropharyngeal exudate.  Trachea midline; intact phonation  Eyes: Conjunctivae and EOM are normal. Pupils are equal, round, and reactive to light.  Neck: Trachea normal and normal range of motion. Neck supple. No JVD present. Carotid bruit is not present.  Cardiovascular: Normal rate and regular rhythm.  Exam reveals no gallop and no friction rub.   No murmur heard. Pulmonary/Chest: Effort normal. No stridor. She has wheezes. She has no rales.  Wheezing bilaterally  Abdominal: Soft. Bowel sounds  are normal. She exhibits no mass. There is no tenderness. There is no rebound and no guarding.  Musculoskeletal: Normal range of motion.  Lymphadenopathy:    She has no cervical adenopathy.  Neurological: She is alert and oriented to person, place, and time. She has normal reflexes. No cranial nerve deficit. She exhibits normal muscle tone. Coordination and gait normal.  Cranial nerves 2-12 intact  Skin: Skin is warm and dry. She is not diaphoretic.  Psychiatric: She has a normal mood and affect. Her behavior is normal.  Nursing note and vitals reviewed.   ED Course  Procedures (including critical care time) DIAGNOSTIC STUDIES: Oxygen Saturation is 98% on RA, normal by my interpretation.    COORDINATION OF CARE: 11:48 PM-Discussed treatment plan which includes ibuprofen, breathing treatment and  with pt at bedside and pt agreed to plan.    Labs Review Labs Reviewed - No data to display  Imaging Review Dg Chest 2 View  02/14/2016  CLINICAL DATA:  Cough and shortness of breath, symptoms for 1 month. EXAM: CHEST  2 VIEW COMPARISON:   12/23/2015 FINDINGS: The cardiomediastinal contours are normal. Increased central bronchial thickening from prior. Pulmonary vasculature is normal. No consolidation, pleural effusion, or pneumothorax. No acute osseous abnormalities are seen. Gastric band in the upper abdomen. IMPRESSION: Bronchial thickening, new from prior exam, may reflect acute bronchitis or asthma. Electronically Signed   By: Jeb Levering M.D.   On: 02/14/2016 23:55   I have personally reviewed and evaluated these images and lab results as part of my medical decision-making.   EKG Interpretation None      MDM   Final diagnoses:  None    Results for orders placed or performed during the hospital encounter of [id number]  Basic metabolic panel  Result Value Ref Range   Sodium 139 135 - 145 mmol/L   Potassium 4.0 3.5 - 5.1 mmol/L   Chloride 103 96 - 112 mEq/L   CO2 27 19 - 32 mmol/L   Glucose, Bld 105 (H) 70 - 99 mg/dL   BUN 8 6 - 23 mg/dL   Creatinine, Ser 0.98 0.50 - 1.10 mg/dL   Calcium 9.2 8.4 - 10.5 mg/dL   GFR calc non Af Amer 61 (L) >90 mL/min   GFR calc Af Amer 71 (L) >90 mL/min   Anion gap 9 5 - 15  CBC  Result Value Ref Range   WBC 7.0 4.0 - 10.5 K/uL   RBC 4.62 3.87 - 5.11 MIL/uL   Hemoglobin 13.2 12.0 - 15.0 g/dL   HCT 38.7 36.0 - 46.0 %   MCV 83.8 78.0 - 100.0 fL   MCH 28.6 26.0 - 34.0 pg   MCHC 34.1 30.0 - 36.0 g/dL   RDW 13.0 11.5 - 15.5 %   Platelets 222 150 - 400 K/uL  Type and screen  Result Value Ref Range   ABO/RH(D) O POS    Antibody Screen NEG    Sample Expiration 01/02/2015    Dg Chest 2 View  02/14/2016  CLINICAL DATA:  Cough and shortness of breath, symptoms for 1 month. EXAM: CHEST  2 VIEW COMPARISON:  12/23/2015 FINDINGS: The cardiomediastinal contours are normal. Increased central bronchial thickening from prior. Pulmonary vasculature is normal. No consolidation, pleural effusion, or pneumothorax. No acute osseous abnormalities are seen. Gastric band in the upper abdomen.  IMPRESSION: Bronchial thickening, new from prior exam, may reflect acute bronchitis or asthma. Electronically Signed   By: Jeb Levering M.D.   On:  02/14/2016 23:55    Medications  albuterol (PROVENTIL) (2.5 MG/3ML) 0.083% nebulizer solution 5 mg (5 mg Nebulization Given 02/14/16 2313)  ibuprofen (ADVIL,MOTRIN) tablet 600 mg (600 mg Oral Given 02/14/16 2346)  doxycycline (VIBRA-TABS) tablet 100 mg (100 mg Oral Given 02/15/16 0033)  albuterol (PROVENTIL) (2.5 MG/3ML) 0.083% nebulizer solution 5 mg (5 mg Nebulization Given 02/15/16 0033)  ipratropium (ATROVENT) nebulizer solution 0.5 mg (0.5 mg Nebulization Given 02/15/16 0033)  predniSONE (DELTASONE) tablet 60 mg (60 mg Oral Given 02/15/16 0033)    Doxycycline has been proven to improve outcomes in patient's with COPD.  Continue home nebs, will prescribe steroids and doxycycline.  Follow up Monday with your PMD for recheck.  Strict return precautions  I personally performed the services described in this documentation, which was scribed in my presence. The recorded information has been reviewed and is accurate.      Veatrice Kells, MD 02/15/16 [id number]

## 2016-02-15 ENCOUNTER — Encounter (HOSPITAL_COMMUNITY): Payer: Self-pay | Admitting: Emergency Medicine

## 2016-02-15 DIAGNOSIS — J441 Chronic obstructive pulmonary disease with (acute) exacerbation: Secondary | ICD-10-CM | POA: Diagnosis not present

## 2016-02-15 MED ORDER — IPRATROPIUM BROMIDE 0.02 % IN SOLN
0.5000 mg | Freq: Once | RESPIRATORY_TRACT | Status: AC
  Administered 2016-02-15: 0.5 mg via RESPIRATORY_TRACT
  Filled 2016-02-15: qty 2.5

## 2016-02-15 MED ORDER — ALBUTEROL SULFATE (2.5 MG/3ML) 0.083% IN NEBU
5.0000 mg | INHALATION_SOLUTION | Freq: Once | RESPIRATORY_TRACT | Status: AC
  Administered 2016-02-15: 5 mg via RESPIRATORY_TRACT
  Filled 2016-02-15: qty 6

## 2016-02-15 MED ORDER — ALBUTEROL SULFATE (2.5 MG/3ML) 0.083% IN NEBU
5.0000 mg | INHALATION_SOLUTION | Freq: Once | RESPIRATORY_TRACT | Status: AC
Start: 2016-02-15 — End: 2016-02-15
  Administered 2016-02-15: 5 mg via RESPIRATORY_TRACT
  Filled 2016-02-15: qty 6

## 2016-02-15 MED ORDER — BENZONATATE 100 MG PO CAPS: 100.0000 mg | ORAL_CAPSULE | Freq: Three times a day (TID) | ORAL | Status: DC

## 2016-02-15 MED ORDER — PREDNISONE 20 MG PO TABS: ORAL_TABLET | ORAL | Status: DC

## 2016-02-15 MED ORDER — DOXYCYCLINE HYCLATE 100 MG PO TABS
100.0000 mg | ORAL_TABLET | Freq: Once | ORAL | Status: AC
  Administered 2016-02-15: 100 mg via ORAL
  Filled 2016-02-15: qty 1

## 2016-02-15 MED ORDER — DOXYCYCLINE HYCLATE 100 MG PO CAPS: 100.0000 mg | ORAL_CAPSULE | Freq: Two times a day (BID) | ORAL | Status: DC

## 2016-02-15 MED ORDER — BENZONATATE 100 MG PO CAPS
200.0000 mg | ORAL_CAPSULE | Freq: Once | ORAL | Status: AC
  Administered 2016-02-15: 200 mg via ORAL
  Filled 2016-02-15: qty 2

## 2016-02-15 MED ORDER — PREDNISONE 20 MG PO TABS
60.0000 mg | ORAL_TABLET | Freq: Once | ORAL | Status: AC
  Administered 2016-02-15: 60 mg via ORAL
  Filled 2016-02-15: qty 3

## 2016-04-22 DIAGNOSIS — F32A Depression, unspecified: Secondary | ICD-10-CM | POA: Insufficient documentation

## 2016-04-22 DIAGNOSIS — F900 Attention-deficit hyperactivity disorder, predominantly inattentive type: Secondary | ICD-10-CM | POA: Insufficient documentation

## 2016-05-04 ENCOUNTER — Encounter: Payer: Self-pay | Admitting: Pulmonary Disease

## 2016-05-04 ENCOUNTER — Ambulatory Visit (INDEPENDENT_AMBULATORY_CARE_PROVIDER_SITE_OTHER): Payer: Medicare Other | Admitting: Pulmonary Disease

## 2016-05-04 ENCOUNTER — Other Ambulatory Visit: Payer: Medicare Other

## 2016-05-04 DIAGNOSIS — J42 Unspecified chronic bronchitis: Secondary | ICD-10-CM | POA: Diagnosis not present

## 2016-05-04 DIAGNOSIS — Z9989 Dependence on other enabling machines and devices: Secondary | ICD-10-CM | POA: Insufficient documentation

## 2016-05-04 DIAGNOSIS — G4733 Obstructive sleep apnea (adult) (pediatric): Secondary | ICD-10-CM | POA: Diagnosis not present

## 2016-05-04 DIAGNOSIS — K219 Gastro-esophageal reflux disease without esophagitis: Secondary | ICD-10-CM

## 2016-05-04 DIAGNOSIS — M1990 Unspecified osteoarthritis, unspecified site: Secondary | ICD-10-CM | POA: Insufficient documentation

## 2016-05-04 DIAGNOSIS — R05 Cough: Secondary | ICD-10-CM

## 2016-05-04 DIAGNOSIS — J309 Allergic rhinitis, unspecified: Secondary | ICD-10-CM

## 2016-05-04 DIAGNOSIS — R059 Cough, unspecified: Secondary | ICD-10-CM

## 2016-05-04 MED ORDER — FLUTTER DEVI: Status: DC

## 2016-05-04 NOTE — Progress Notes (Signed)
Subjective:    Patient ID: Abigail Huerta, female    DOB: [birth year], 60 y.o.   MRN: [id number]  HPI She reports starting in December 2016 she began to experience a "heavy chest" as well as "burning" in the back of her throat. She was treated with an antibiotic and Prednisone with resolution of her symptoms. Her symptoms recurred in January 2017. She was again treated the same with resolution of symptoms. She reports she continues to have a cough productive of a "green" or "white mucus" that is hard at times. She also reports it looks like "worms" at times as well. She reports she coughs more at night and it does wake her up. She reports her cough is no worse but no better. She does wheeze occasionally. Does have dyspnea but reports she doesn't exert herself significantly. Prior to December her last episode of bronchitis was remote. She reports she had frequent bronchitis every 3 months when she was smoking cigarettes. She does have some chest tightness & pain particularly in her shoulders with her cough. No fever, chills, or sweats. Denies any history of pneumonia. She does have year round sinus congestion & pressure. She does use albuterol once twice a week that seems to help her symptoms.   Review of Systems She reports rare reflux & dyspepsia. Remotely she has woke up with reflux into her mouth. No dysuria or hematuria. A pertinent 14 point review of systems is negative except as per the history of presenting illness.  No Known Allergies  Current Outpatient Prescriptions on File Prior to Visit  Medication Sig Dispense Refill  . albuterol (PROVENTIL HFA;VENTOLIN HFA) 108 (90 BASE) MCG/ACT inhaler Inhale 2 puffs into the lungs every 6 (six) hours as needed for shortness of breath.    Marland Kitchen albuterol (PROVENTIL) (2.5 MG/3ML) 0.083% nebulizer solution Take 2.5 mg by nebulization every 6 (six) hours as needed for wheezing or shortness of breath.    Marland Kitchen aspirin EC 325 MG tablet Take 1 tablet (325 mg total)  by mouth 2 (two) times daily. 60 tablet 0  . B Complex-C (B-COMPLEX WITH VITAMIN C) tablet Take 1 tablet by mouth daily.    Marland Kitchen buPROPion (WELLBUTRIN XL) 300 MG 24 hr tablet Take 300 mg by mouth daily.    Marland Kitchen dextromethorphan-guaiFENesin (MUCINEX DM) 30-600 MG 12hr tablet Take 1 tablet by mouth 2 (two) times daily as needed for cough.    . diphenhydrAMINE (BENADRYL) 25 mg capsule Take 25-50 mg by mouth daily as needed for allergies.    Marland Kitchen docusate sodium 100 MG CAPS Take 100 mg by mouth 2 (two) times daily. 10 capsule   . fentaNYL (DURAGESIC - DOSED MCG/HR) 100 MCG/HR Place 100 mcg onto the skin every other day. New patch applied 01/13/2016    . guaiFENesin (ROBITUSSIN) 100 MG/5ML liquid Take 200 mg by mouth 3 (three) times daily as needed for cough.    Marland Kitchen oxyCODONE-acetaminophen (PERCOCET) 10-325 MG tablet Take 1 tablet by mouth every 6 (six) hours as needed for pain.    . polyethylene glycol (MIRALAX / GLYCOLAX) packet Take 17 g by mouth daily as needed. 14 each   . promethazine (PHENERGAN) 25 MG tablet Take 25 mg by mouth every 6 (six) hours as needed for nausea.    Marland Kitchen tiZANidine (ZANAFLEX) 4 MG tablet Take 4 mg by mouth 4 (four) times daily.     No current facility-administered medications on file prior to visit.    Past Medical History  Diagnosis Date  .  Depression   . Anxiety   . Fibromyalgia   . Arthritis   . Asthma   . Sleep apnea     cpap setting at 9  . Carpal tunnel syndrome, bilateral   . ADD (attention deficit disorder)   . Friction blisters of the palms     Uses Halobetasol  . Bronchitis     12-23-15 being seen today for bronchitis, ? whether she will have Colonoscopy scheduled 01-05-16  . Chronic pain     "chronic pain-arthritis back and spine"  . Eczema   . Osteoarthritis   . Osteopenia   . Cardiac murmur   . Lyme disease   . Hyperlipemia   . 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 Hip surgery Right 2005    Hip  replacement  . Laparoscopic gastric banding  2005  . Abdominal hysterectomy      1985  . Total hip arthroplasty Left 04/17/2013    Procedure: TOTAL HIP ARTHROPLASTY ANTERIOR APPROACH;  Surgeon: Mauri Pole, MD;  Location: [facility];  Service: Orthopedics;  Laterality: Left;  . Knee arthroscopy    . Tendon repair Right     forearm  . Eye surgery Left     Retinal surgery  . Maximum access (mas)posterior lumbar interbody fusion (plif) 1 level N/A 12/26/2014    Procedure: FOR MAXIMUM ACCESS (MAS) POSTERIOR LUMBAR INTERBODY FUSION (PLIF) 1 LEVEL;  Surgeon: Eustace Moore, MD;  Location: [place] [facility];  Service: Neurosurgery;  Laterality: N/A;  FOR MAXIMUM ACCESS (MAS) POSTERIOR LUMBAR INTERBODY FUSION (PLIF) 1 LEVEL LUMBAR 4-5    Family History  Problem Relation Age of Onset  . Lung disease Son   . Heart disease Father     Social History   Social History  . Marital Status: Married    Spouse Name: N/A  . Number of Children: N/A  . Years of Education: N/A   Occupational History  . Retired     Development worker, community   Social History Main Topics  . Smoking status: Former Smoker -- 1.50 packs/day for 22 years    Types: Cigarettes    Start date: 01/16/1972    Quit date: 12/27/1993  . Smokeless tobacco: Never Used  . Alcohol Use: No  . Drug Use: No  . Sexual Activity: Not Asked   Other Topics Concern  . None   Social History Narrative   Originally from [place]. Previously has lived in [place], [place], [place], & [place]. No international travel. Previously has worked with exposure to saccharin, fluoride, silica, etc inhaled fumes and dusts. Currently has dogs. Remote exposure to a cockatiel for 1.5 years.       Objective:   Physical Exam BP 126/94 mmHg  Pulse 95  Ht 5\' 7"  (1.702 m)  Wt 204 lb (92.534 kg)  BMI 31.94 kg/m2  SpO2 96% General:  Awake. Alert. No acute distress. Mild central obesity. Integument:  Warm & dry. No bruising. Exfoliation & scaling of skin on palms with cracking of  dermis. Lymphatics:  No appreciated cervical or supraclavicular lymphadenoapthy. HEENT:  Moist mucus membranes. No oral ulcers. No scleral injection or icterus. No Nasal turbinate swelling. Cardiovascular:  Regular rate. No edema. No appreciable JVD.  Pulmonary:  Good aeration & clear to auscultation bilaterally. Symmetric chest wall expansion. No accessory muscle use. Abdomen: Soft. Normal bowel sounds. Nondistended. Grossly nontender. Musculoskeletal:  Normal bulk and tone. Hand grip strength  5/5 bilaterally. No joint deformity or effusion appreciated. Neurological:  CN 2-12 grossly in tact. No meningismus. Moving all 4 extremities equally. Symmetric brachioradialis deep tendon reflexes. Psychiatric:  Mood and affect congruent. Speech normal rhythm, rate & tone.   IMAGING CXR PA/LAT 02/14/16 (personally reviewed by me): Bronchial wall thickening. Chronic elevation left hemidiaphragm. No focal opacity or nodule appreciated. No pleural effusion. Mediastinum normal in contour. Heart normal in size.    Assessment & Plan:  60 year old female with ongoing productive cough and history of chronic bronchitis. Patient will need to have her immune system evaluated for potential immunoglobulin deficiency. Certainly with her long history of tobacco use and inhaled exposures COPD is quite probable. I am evaluating the patient's underlying lung parenchyma to see if there is evidence of bronchiectasis or other parenchymal abnormalities that would account for her ongoing cough. An atypical infectious process is certainly possible which will need to be addressed with a culture. I'm holding off on initiating antibiotics at this time. Patient to call me if she has any new problems or questions before next appointment.  1. Chronic bronchitis: Checking quantitative immunoglobulin panel. Starting flutter valve/Acapella for airway clearance. Checking high-resolution CT chest without contrast. Checking full pulmonary  function testing. 2. GERD: Relatively asymptomatic. Holding off on treatment at this time. 3. Allergic rhinitis: Checking serum IgE. 4. OSA: Continuing on CPAP. 5. Follow-up: Patient to return to clinic in 4-8 weeks.  Sonia Baller Ashok Cordia, M.D. [facility] Pager:  [phone] After 3pm or if no response, call [phone] 3:46 PM 05/04/2016

## 2016-05-04 NOTE — Patient Instructions (Signed)
   Remember to use your Acapella/Flutter Valve twice daily - blow into it 3 times in the morning and then again in the evening.  We will review your breathing tests at your next appointment  I will see you back in 4-8 weeks  TESTS ORDERED: 1. High-resolution CT chest without contrast 2. Full pulmonary function testing 3. Sputum culture for AFB, fungus, and bacteria 4. IgE & quantitative immunoglobulin panel

## 2016-05-05 ENCOUNTER — Ambulatory Visit (INDEPENDENT_AMBULATORY_CARE_PROVIDER_SITE_OTHER)
Admission: RE | Admit: 2016-05-05 | Discharge: 2016-05-05 | Disposition: A | Discharge status: Home / Self Care | Payer: Medicare Other | Source: Ambulatory Visit | Attending: Pulmonary Disease | Admitting: Pulmonary Disease

## 2016-05-05 DIAGNOSIS — J42 Unspecified chronic bronchitis: Secondary | ICD-10-CM

## 2016-05-05 DIAGNOSIS — R059 Cough, unspecified: Secondary | ICD-10-CM

## 2016-05-05 DIAGNOSIS — R05 Cough: Secondary | ICD-10-CM | POA: Diagnosis not present

## 2016-05-05 LAB — IGG, IGA, IGM
IGG (IMMUNOGLOBIN G), SERUM: 694 mg/dL (ref 690–1700)
IGM, SERUM: 244 mg/dL (ref 52–322)
IgA: 137 mg/dL (ref 69–380)

## 2016-05-05 LAB — IGE: IGE (IMMUNOGLOBULIN E), SERUM: 3 kU/L (ref <115)

## 2016-05-08 LAB — RESPIRATORY CULTURE OR RESPIRATORY AND SPUTUM CULTURE: ORGANISM ID, BACTERIA: NORMAL

## 2016-06-03 ENCOUNTER — Encounter: Payer: Self-pay | Admitting: Pulmonary Disease

## 2016-06-03 ENCOUNTER — Other Ambulatory Visit (INDEPENDENT_AMBULATORY_CARE_PROVIDER_SITE_OTHER): Payer: Medicare Other

## 2016-06-03 ENCOUNTER — Ambulatory Visit (HOSPITAL_COMMUNITY)
Admission: RE | Admit: 2016-06-03 | Discharge: 2016-06-03 | Disposition: A | Discharge status: Home / Self Care | Payer: Medicare Other | Source: Ambulatory Visit | Attending: Pulmonary Disease | Admitting: Pulmonary Disease

## 2016-06-03 ENCOUNTER — Ambulatory Visit (INDEPENDENT_AMBULATORY_CARE_PROVIDER_SITE_OTHER): Payer: Medicare Other | Admitting: Pulmonary Disease

## 2016-06-03 ENCOUNTER — Telehealth: Payer: Self-pay | Admitting: Pulmonary Disease

## 2016-06-03 VITALS — BP 148/86 | HR 87 | Ht 67.0 in | Wt 203.0 lb

## 2016-06-03 DIAGNOSIS — K219 Gastro-esophageal reflux disease without esophagitis: Secondary | ICD-10-CM

## 2016-06-03 DIAGNOSIS — G4733 Obstructive sleep apnea (adult) (pediatric): Secondary | ICD-10-CM

## 2016-06-03 DIAGNOSIS — J309 Allergic rhinitis, unspecified: Secondary | ICD-10-CM | POA: Insufficient documentation

## 2016-06-03 DIAGNOSIS — Z9989 Dependence on other enabling machines and devices: Secondary | ICD-10-CM

## 2016-06-03 DIAGNOSIS — R918 Other nonspecific abnormal finding of lung field: Secondary | ICD-10-CM | POA: Diagnosis not present

## 2016-06-03 DIAGNOSIS — J42 Unspecified chronic bronchitis: Secondary | ICD-10-CM | POA: Insufficient documentation

## 2016-06-03 LAB — PULMONARY FUNCTION TEST
DL/VA % PRED: 68 %
DL/VA: 3.53 ml/min/mmHg/L
DLCO UNC: 19.13 ml/min/mmHg
DLCO unc % pred: 67 %
FEF 25-75 POST: 4.35 L/s
FEF 25-75 Pre: 3.5 L/sec
FEF2575-%CHANGE-POST: 24 %
FEF2575-%PRED-POST: 179 %
FEF2575-%Pred-Pre: 144 %
FEV1-%CHANGE-POST: 5 %
FEV1-%PRED-PRE: 113 %
FEV1-%Pred-Post: 120 %
FEV1-POST: 3.34 L
FEV1-Pre: 3.15 L
FEV1FVC-%CHANGE-POST: 4 %
FEV1FVC-%PRED-PRE: 105 %
FEV6-%Change-Post: 1 %
FEV6-%Pred-Post: 111 %
FEV6-%Pred-Pre: 109 %
FEV6-Post: 3.88 L
FEV6-Pre: 3.82 L
FEV6FVC-%PRED-POST: 104 %
FEV6FVC-%Pred-Pre: 104 %
FVC-%Change-Post: 1 %
FVC-%PRED-POST: 107 %
FVC-%PRED-PRE: 105 %
FVC-POST: 3.88 L
FVC-PRE: 3.82 L
POST FEV1/FVC RATIO: 86 %
PRE FEV1/FVC RATIO: 82 %
PRE FEV6/FVC RATIO: 100 %
Post FEV6/FVC ratio: 100 %
RV % PRED: 97 %
RV: 2.13 L
TLC % pred: 106 %
TLC: 5.84 L

## 2016-06-03 LAB — SEDIMENTATION RATE: Sed Rate: 8 mm/hr (ref 0–30)

## 2016-06-03 LAB — C-REACTIVE PROTEIN: CRP: 0.7 mg/dL (ref 0.5–20.0)

## 2016-06-03 MED ORDER — ALBUTEROL SULFATE (2.5 MG/3ML) 0.083% IN NEBU
2.5000 mg | INHALATION_SOLUTION | Freq: Once | RESPIRATORY_TRACT | Status: AC
  Administered 2016-06-03: 2.5 mg via RESPIRATORY_TRACT

## 2016-06-03 NOTE — Progress Notes (Signed)
Subjective:    Patient ID: Abigail Huerta, female    DOB: 09-11-1956, 60 y.o.   MRN: [id number]  C.C.:  Follow-up for Ground Glass Opacities, Chronic Bronchitis, GERD, Allergic Rhinitis, & OSA.  HPI Ground Glass Opacities:  She reports her dyspnea is improving. Does have prior exposure to chemicals and silica. No new rashes. Chronic joint pain & stiffness.   Chronic Bronchitis:  Continues to cough up a "dark, tan" mucus intermittently. Denies any hemoptysis. No antibiotics since last appointment.   GERD:  Rare reflux & dyspepsia. No morning brash water taste. No chronic medication.  Allergic Rhinitis:  Rare sinus congestion & pressure. No post nasal drainage. Uses Benadryl prn.  OSA:  On CPAP therapy. Reports compliance. Reports good quality of sleep.   Review of Systems She reports every once in a while she will have chest tightness with radiation to her shoulder blades. Previously reported normal EKG. Relieved with Prilosec. No fever or chills. Chronic sweats.   No Known Allergies  Current Outpatient Prescriptions on File Prior to Visit  Medication Sig Dispense Refill  . albuterol (PROVENTIL HFA;VENTOLIN HFA) 108 (90 BASE) MCG/ACT inhaler Inhale 2 puffs into the lungs every 6 (six) hours as needed for shortness of breath.    Marland Kitchen albuterol (PROVENTIL) (2.5 MG/3ML) 0.083% nebulizer solution Take 2.5 mg by nebulization every 6 (six) hours as needed for wheezing or shortness of breath.    . B Complex-C (B-COMPLEX WITH VITAMIN C) tablet Take 1 tablet by mouth daily.    Marland Kitchen buPROPion (WELLBUTRIN XL) 300 MG 24 hr tablet Take 300 mg by mouth daily.    Marland Kitchen dextromethorphan-guaiFENesin (MUCINEX DM) 30-600 MG 12hr tablet Take 1 tablet by mouth 2 (two) times daily as needed for cough.    . diphenhydrAMINE (BENADRYL) 25 mg capsule Take 25-50 mg by mouth daily as needed for allergies.    Marland Kitchen escitalopram (LEXAPRO) 10 MG tablet Take 1 tablet by mouth daily.  3  . fentaNYL (DURAGESIC - DOSED MCG/HR) 100  MCG/HR Place 100 mcg onto the skin every other day. New patch applied 01/13/2016    . guaiFENesin (ROBITUSSIN) 100 MG/5ML liquid Take 200 mg by mouth 3 (three) times daily as needed for cough.    Marland Kitchen LORazepam (ATIVAN) 1 MG tablet Take 1 tablet by mouth as needed.  0  . methylphenidate (RITALIN) 20 MG tablet Take 1 tablet by mouth 2 (two) times daily.  0  . oxyCODONE-acetaminophen (PERCOCET) 10-325 MG tablet Take 1 tablet by mouth every 6 (six) hours as needed for pain.    . promethazine (PHENERGAN) 25 MG tablet Take 25 mg by mouth every 6 (six) hours as needed for nausea.    Marland Kitchen Respiratory Therapy Supplies (FLUTTER) DEVI As directed 1 each 0  . tiZANidine (ZANAFLEX) 4 MG tablet Take 4 mg by mouth 4 (four) times daily.     No current facility-administered medications on file prior to visit.    Past Medical History  Diagnosis Date  . Depression   . Anxiety   . Fibromyalgia   . Arthritis   . Asthma   . Sleep apnea     cpap setting at 9  . Carpal tunnel syndrome, bilateral   . ADD (attention deficit disorder)   . Friction blisters of the palms     Uses Halobetasol  . Bronchitis     12-23-15 being seen today for bronchitis, ? whether she will have Colonoscopy scheduled 01-05-16  . Chronic pain     "chronic  pain-arthritis back and spine"  . Eczema   . Osteoarthritis   . Osteopenia   . Cardiac murmur   . Lyme disease   . Hyperlipemia   . 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 Hip surgery Right 2005    Hip replacement  . Laparoscopic gastric banding  2005  . Abdominal hysterectomy      1985  . Total hip arthroplasty Left 04/17/2013    Procedure: TOTAL HIP ARTHROPLASTY ANTERIOR APPROACH;  Surgeon: Mauri Pole, MD;  Location: [facility];  Service: Orthopedics;  Laterality: Left;  . Knee arthroscopy    . Tendon repair Right     forearm  . Eye surgery Left     Retinal surgery  . Maximum access (mas)posterior lumbar interbody  fusion (plif) 1 level N/A 12/26/2014    Procedure: FOR MAXIMUM ACCESS (MAS) POSTERIOR LUMBAR INTERBODY FUSION (PLIF) 1 LEVEL;  Surgeon: Eustace Moore, MD;  Location: [place] [facility];  Service: Neurosurgery;  Laterality: N/A;  FOR MAXIMUM ACCESS (MAS) POSTERIOR LUMBAR INTERBODY FUSION (PLIF) 1 LEVEL LUMBAR 4-5    Family History  Problem Relation Age of Onset  . Lung disease Son   . Heart disease Father     Social History   Social History  . Marital Status: Married    Spouse Name: N/A  . Number of Children: N/A  . Years of Education: N/A   Occupational History  . Retired     Development worker, community   Social History Main Topics  . Smoking status: Former Smoker -- 1.50 packs/day for 22 years    Types: Cigarettes    Start date: 01/16/1972    Quit date: 12/27/1993  . Smokeless tobacco: Never Used  . Alcohol Use: No  . Drug Use: No  . Sexual Activity: Not Asked   Other Topics Concern  . None   Social History Narrative   Originally from [place]. Previously has lived in [place], [place], [place], & [place]. No international travel. Previously has worked with exposure to saccharin, fluoride, silica, etc inhaled fumes and dusts. Currently has dogs. Remote exposure to a cockatiel for 1.5 years.       Objective:   Physical Exam BP 148/86 mmHg  Pulse 87  Ht '5\' 7"'  (1.702 m)  Wt 203 lb (92.08 kg)  BMI 31.79 kg/m2  SpO2 98% General:  Awake. Alert. No distress. Mild central obesity. Integument:  Warm & dry. No bruising.  Lymphatics:  No appreciated cervical or supraclavicular lymphadenoapthy. HEENT:  Moist mucus membranes. No oral ulcers. No scleral injection.  Cardiovascular:  Regular rate. No edema. Normal S1 & S2. Pulmonary:  Clear bilaterally to auscultation. Normal work of breathing on room air.   PFT 06/03/16:  FVC 3.82 L (105%) FEV1 3.15 L (113%) FEV/1FVC 0.82 no bronchodilator response TLC 5.84 L (106%) RV 97% ERV 42% DLCO uncorrected 67%  IMAGING HRCT CHEST W/O 05/05/16 (personally reviewed by me):  Laparoscopic gastric band in appropriate position. No pleural effusion or thickening. No pericardial effusion. Air trapping throughout both lungs. Patchy, upper lobe predominant centrilobular nodularity & groundglass densities. No significant reticular changes or intralobular septal thickening. No traction bronchiectasis however there is some subtle bronchiectasis appreciated by me. No honeycombing. Suggestive of subacute hypersensitivity pneumonitis. No pathologic mediastinal adenopathy.  CXR PA/LAT 02/14/16 (previously reviewed by me): Bronchial wall thickening. Chronic elevation left hemidiaphragm. No focal opacity or nodule appreciated. No pleural effusion. Mediastinum normal  in contour. Heart normal in size.  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 Patient's chronic bronchitis has had no recent flares. I reviewed her PFTs which show a reduced DLCO consistent with findings on her HRCT today. A formal autoimmune workup is necessary before I initiate immunosuppression. I instructed the patient to notify me if she had any new problems or questions before her next appointment.   1. Ground Glass Opacities:  Checking Hypersensitivity Pneumonitis Panel, ESR, CRP, Anti-CCP, and ANA w/ reflex to comprehensive panel. Repeat Spirometry w/ DLCO and 6MWT at next appointment. 2. Chronic Bronchitis: Likely due to prior exposures.  3. GERD: No new symptoms. No new medications. 4. Allergic rhinitis: Minimal symptoms. No new medications at this time. 5. OSA: Continuing on CPAP indefinitely. 6. Follow-up: Patient to return to clinic in 3 months or sooner if needed.  Sonia Baller Ashok Cordia, M.D. [facility] Pulmonary [facility] Critical [facility] Pager:  [phone] After 3pm or if no response, call [phone] 12:52  PM 06/03/2016

## 2016-06-03 NOTE — Patient Instructions (Addendum)
   Call me if you have any new breathing problems before your next appointment.  We will review your test results at your follow-up.  I will see you back in 3 months with breathing tests at the same time.  TESTS ORDERED: 1. Spirometry with DLCO at next appointment 2. 6MWT on RA at next appointment 3. Hypersensitivity Pneumonitis Panel, ESR, CRP, Anti-CCP, & ANA with reflex to comprehensive panel.

## 2016-06-03 NOTE — Telephone Encounter (Signed)
IMAGING HRCT CHEST W/O 05/05/16 (personally reviewed by me): Laparoscopic gastric band in appropriate position. No pleural effusion or thickening. No pericardial effusion. Air trapping throughout both lungs. Patchy, upper lobe predominant centrilobular nodularity & groundglass densities. No significant reticular changes or intralobular septal thickening. No traction bronchiectasis however there is some subtle bronchiectasis appreciated by me. No honeycombing. Suggestive of subacute hypersensitivity pneumonitis. No pathologic mediastinal adenopathy.  MICROBIOLOGY Sputum Ctx (05/04/16): Few squamous epithelials / Oral Flora / AFB pending / Budding Yeast  LABS 05/04/16 IgE: 3 IgG: 694 IgA: 137 IgM: 244

## 2016-06-04 LAB — CYCLIC CITRUL PEPTIDE ANTIBODY, IGG: Cyclic Citrullin Peptide Ab: <16 Units

## 2016-06-04 LAB — ANA, IFA COMPREHENSIVE PANEL
ANA: NEGATIVE
ENA SM Ab Ser-aCnc: <1
SCLERODERMA (SCL-70) (ENA) ANTIBODY, IGG: NEGATIVE
SM/RNP: <1
SSA (RO) (ENA) ANTIBODY, IGG: NEGATIVE
SSB (LA) (ENA) ANTIBODY, IGG: NEGATIVE

## 2016-06-09 LAB — HYPERSENSITIVITY PNUEMONITIS PROFILE

## 2016-06-11 ENCOUNTER — Telehealth: Payer: Self-pay | Admitting: Pulmonary Disease

## 2016-06-11 DIAGNOSIS — G4733 Obstructive sleep apnea (adult) (pediatric): Secondary | ICD-10-CM

## 2016-06-11 DIAGNOSIS — Z9989 Dependence on other enabling machines and devices: Secondary | ICD-10-CM

## 2016-06-11 NOTE — Telephone Encounter (Signed)
Patient says that her CPAP machine is working fine, but she just needed a new Humidifier, she said that she called [facility] and they told her that they did not have any parts for this machine any longer and she would need a new machine.  Patient states that her pressure setting is on 11cm.  Requested that we get a download from current machine. Patient is going to take machine by [facility] to obtain download.  Dr. Ashok Cordia, ok to order new CPAP.  Please advise.  (patient is aware that Dr. Ashok Cordia is Out of office today and will not get message until Monday, she is okay waiting for his return)

## 2016-06-11 NOTE — Progress Notes (Signed)
Quick Note:  Spoke with pt and notified of results per Dr. Ashok Cordia. Pt verbalized understanding and denied any questions.  ______

## 2016-06-11 NOTE — Telephone Encounter (Signed)
Pt's machine is not able to get a download off of it due to the age of it pt calling back [phone]

## 2016-06-12 NOTE — Telephone Encounter (Signed)
We can order a new machine and supplies. Let's do an auto-cpap though with low pressure 5 & high pressure 11 with humidification & heated tubing. Simply order the mask she is currently using. Thanks.

## 2016-06-14 NOTE — Telephone Encounter (Signed)
Patient notified of Dr. Ammie Dalton recommendations. CPAP ordered. Nothing further needed.

## 2016-06-21 LAB — FUNGUS CULTURE W SMEAR

## 2016-06-21 LAB — AFB CULTURE WITH SMEAR (NOT AT ARMC): Source:: 0

## 2016-06-30 DIAGNOSIS — F411 Generalized anxiety disorder: Secondary | ICD-10-CM | POA: Insufficient documentation

## 2016-09-03 ENCOUNTER — Ambulatory Visit (INDEPENDENT_AMBULATORY_CARE_PROVIDER_SITE_OTHER): Payer: Medicare Other | Admitting: Pulmonary Disease

## 2016-09-03 ENCOUNTER — Encounter: Payer: Self-pay | Admitting: Pulmonary Disease

## 2016-09-03 VITALS — BP 126/84 | HR 69 | Ht 67.0 in | Wt 201.0 lb

## 2016-09-03 DIAGNOSIS — J42 Unspecified chronic bronchitis: Secondary | ICD-10-CM

## 2016-09-03 DIAGNOSIS — Z23 Encounter for immunization: Secondary | ICD-10-CM

## 2016-09-03 DIAGNOSIS — J309 Allergic rhinitis, unspecified: Secondary | ICD-10-CM | POA: Diagnosis not present

## 2016-09-03 DIAGNOSIS — K219 Gastro-esophageal reflux disease without esophagitis: Secondary | ICD-10-CM | POA: Diagnosis not present

## 2016-09-03 DIAGNOSIS — Z9989 Dependence on other enabling machines and devices: Secondary | ICD-10-CM

## 2016-09-03 DIAGNOSIS — R918 Other nonspecific abnormal finding of lung field: Secondary | ICD-10-CM | POA: Diagnosis not present

## 2016-09-03 DIAGNOSIS — G4733 Obstructive sleep apnea (adult) (pediatric): Secondary | ICD-10-CM

## 2016-09-03 DIAGNOSIS — R0609 Other forms of dyspnea: Secondary | ICD-10-CM

## 2016-09-03 LAB — PULMONARY FUNCTION TEST
DL/VA % PRED: 66 %
DL/VA: 3.45 ml/min/mmHg/L
DLCO COR: 18.83 ml/min/mmHg
DLCO UNC % PRED: 64 %
DLCO cor % pred: 66 %
DLCO unc: 18.36 ml/min/mmHg
FEF 25-75 Pre: 3.49 L/sec
FEF2575-%Pred-Pre: 144 %
FEV1-%Pred-Pre: 108 %
FEV1-Pre: 3.02 L
FEV1FVC-%PRED-PRE: 107 %
FEV6-%Pred-Pre: 104 %
FEV6-Pre: 3.64 L
FEV6FVC-%PRED-PRE: 104 %
FVC-%Pred-Pre: 101 %
FVC-Pre: 3.65 L
PRE FEV6/FVC RATIO: 100 %
Pre FEV1/FVC ratio: 83 %

## 2016-09-03 NOTE — Progress Notes (Signed)
Test reviewed.  

## 2016-09-03 NOTE — Patient Instructions (Addendum)
   Call me if you have any new breathing problems or questions before your next appointment.  Remember your sputum specimen should not be refrigerated. It should be dropped off within 4 hours to the lab.  Remember to get your flu vaccine in 3-4 weeks from today.  We will repeat a breathing & walking test at your next appointment in 3 months.  TESTS ORDERED: 1. Sputum Culture for AFB, Fungus, & Bacteria.  2. Spirometry with DLCO at next appointment 3. 6MWT on room air at next appointment 4. Esophagram

## 2016-09-03 NOTE — Progress Notes (Signed)
Subjective:    Patient ID: Abigail Huerta, female    DOB: June 18, 1956, 60 y.o.   MRN: [id number]  C.C.:  Follow-up for Ground Glass Opacities, Chronic Bronchitis, GERD, Allergic Rhinitis, & OSA.  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 Prior exposure to chemicals and silica. No new rashes. She reports she has a rare, intermittent cough that produces a "dark yellow" color. Denies any wheezing. Denies any dyspnea out of her normal.   Chronic Bronchitis:  No exacerbations since last appointment. No fever, chills, or sweats. No hemoptysis.   GERD:  She does admit to a burning sensation in her mid-sternum. Denies any morning brash water taste. Not regularly using medication.   Allergic Rhinitis:  Using Benadryl prn and regularly. Intermittent sinus congestion relieved with Benadryl.   OSA:  On CPAP therapy. CPAP ordered with auto CPAP with low pressure 5 cGy 20 & high pressure 11 cm H2O with humidification and heated tubing. She reports despite the humidification she does have dry mouth. She reports she is using her machine but still has problems sleeping.   Review of Systems No chest pain or pressure. No emesis. Does have occasional nausea. No new rashes or abnormal bruising.   No Known Allergies  Current Outpatient Prescriptions on File Prior to Visit  Medication Sig Dispense Refill  . albuterol (PROVENTIL HFA;VENTOLIN HFA) 108 (90 BASE) MCG/ACT inhaler Inhale 2 puffs into the lungs every 6 (six) hours as needed for shortness of breath.    Marland Kitchen albuterol (PROVENTIL) (2.5 MG/3ML) 0.083% nebulizer solution Take 2.5 mg by nebulization every 6 (six) hours as needed for wheezing or shortness of breath.    Marland Kitchen aspirin 81 MG tablet Take 81 mg by mouth daily.    . B Complex-C (B-COMPLEX WITH VITAMIN C) tablet Take 1 tablet by mouth daily.    Marland Kitchen buPROPion (WELLBUTRIN XL) 300 MG 24 hr tablet Take 300 mg by mouth daily.    Marland Kitchen dextromethorphan-guaiFENesin (MUCINEX DM)  30-600 MG 12hr tablet Take 1 tablet by mouth 2 (two) times daily as needed for cough.    . diphenhydrAMINE (BENADRYL) 25 mg capsule Take 25-50 mg by mouth daily as needed for allergies.    Marland Kitchen escitalopram (LEXAPRO) 10 MG tablet Take 1 tablet by mouth daily.  3  . fentaNYL (DURAGESIC - DOSED MCG/HR) 100 MCG/HR Place 100 mcg onto the skin every other day. New patch applied 01/13/2016    . guaiFENesin (ROBITUSSIN) 100 MG/5ML liquid Take 200 mg by mouth 3 (three) times daily as needed for cough.    Marland Kitchen LORazepam (ATIVAN) 1 MG tablet Take 1 tablet by mouth as needed.  0  . methylphenidate (RITALIN) 20 MG tablet Take 1 tablet by mouth 2 (two) times daily.  0  . oxyCODONE-acetaminophen (PERCOCET) 10-325 MG tablet Take 1 tablet by mouth every 6 (six) hours as needed for pain.    . promethazine (PHENERGAN) 25 MG tablet Take 25 mg by mouth every 6 (six) hours as needed for nausea.    Marland Kitchen Respiratory Therapy Supplies (FLUTTER) DEVI As directed 1 each 0  . tiZANidine (ZANAFLEX) 4 MG tablet Take 4 mg by mouth 4 (four) times daily.     No current facility-administered medications on file prior to visit.     Past Medical History:  Diagnosis Date  . ADD (attention deficit disorder)   . Anxiety   . Arthritis   . Asthma   . Bronchitis    12-23-15 being seen today for bronchitis, ?  whether she will have Colonoscopy scheduled 01-05-16  . Cardiac murmur   . Carpal tunnel syndrome, bilateral   . Chronic pain    "chronic pain-arthritis back and spine"  . Depression   . Eczema   . Fibromyalgia   . Friction blisters of the palms    Uses Halobetasol  . 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 Hyperlipemia   . Lyme disease   . Osteoarthritis   . Osteopenia   . Sleep apnea    cpap setting at 9    Past Surgical History:  Procedure Laterality Date  . ABDOMINAL HYSTERECTOMY     1985  . EYE SURGERY Left    Retinal surgery  . HIP SURGERY Right 2005   Hip replacement  . KNEE ARTHROSCOPY    . LAPAROSCOPIC  GASTRIC BANDING  2005  . MAXIMUM ACCESS (MAS)POSTERIOR LUMBAR INTERBODY FUSION (PLIF) 1 LEVEL N/A 12/26/2014   Procedure: FOR MAXIMUM ACCESS (MAS) POSTERIOR LUMBAR INTERBODY FUSION (PLIF) 1 LEVEL;  Surgeon: Eustace Moore, MD;  Location: [facility];  Service: Neurosurgery;  Laterality: N/A;  FOR MAXIMUM ACCESS (MAS) POSTERIOR LUMBAR INTERBODY FUSION (PLIF) 1 LEVEL LUMBAR 4-5  . TENDON REPAIR Right    forearm  . TOTAL HIP ARTHROPLASTY Left 04/17/2013   Procedure: TOTAL HIP ARTHROPLASTY ANTERIOR APPROACH;  Surgeon: Mauri Pole, MD;  Location: [facility];  Service: Orthopedics;  Laterality: Left;    Family History  Problem Relation Age of Onset  . Lung disease Son   . Heart disease Father     Social History   Social History  . Marital status: Married    Spouse name: N/A  . Number of children: N/A  . Years of education: N/A   Occupational History  . Retired     Development worker, community   Social History Main Topics  . Smoking status: Former Smoker    Packs/day: 1.50    Years: 22.00    Types: Cigarettes    Start date: 01/16/1972    Quit date: 12/27/1993  . Smokeless tobacco: Never Used  . Alcohol use No  . Drug use: No  . Sexual activity: Not Asked   Other Topics Concern  . None   Social History Narrative   Originally from [place]. Previously has lived in [place], [place], [place], & [place]. No international travel. Previously has worked with exposure to saccharin, fluoride, silica, etc inhaled fumes and dusts. Currently has dogs. Remote exposure to a cockatiel for 1.5 years.       Objective:   Physical Exam BP 126/84 (BP Location: Right Arm, Patient Position: Sitting, Cuff Size: Normal)   Pulse 69   Ht '5\' 7"'  (1.702 m)   Wt 201 lb (91.2 kg)   SpO2 97%   BMI 31.48 kg/m  General:  Awake. Alert. Mild central obesity.Comfortable. Integument:  Warm & dry. No bruising.  Lymphatics:  No appreciated cervical or supraclavicular lymphadenoapthy. HEENT:  Moist mucus membranes. No oral ulcers. No scleral icterus.  Minimal nasal turbinate swelling. Cardiovascular:  Regular rate. No edema. Normal S1 & S2. Pulmonary:  Again no accessory muscle use on room air. Clear to auscultation bilaterally.  PFT 09/03/16: FVC 3.65 L (101%) FEV1 3.02 L (108%) FEV1/FVC 0.83 FEF 25-75 3.49 L (144%)  DLCO corrected 66% (Hgb 12.6) 06/03/16: FVC 3.82 L (105%) FEV1 3.15 L (113%) FEV/1FVC 0.82 FEF 25-75 3.50 L (144%) no bronchodilator response TLC 5.84 L (106%) RV 97% ERV 42% DLCO uncorrected 67%  6MWT 09/03/16:  Walked 384 meters / Baseline Sat 100% on RA / Nadir Sat 96% on RA (stopped w/ 30sec due to hip & back pain)  IMAGING HRCT CHEST W/O 05/05/16 (previously reviewed by me): Laparoscopic gastric band in appropriate position. No pleural effusion or thickening. No pericardial effusion. Air trapping throughout both lungs. Patchy, upper lobe predominant centrilobular nodularity & groundglass densities. No significant reticular changes or intralobular septal thickening. No traction bronchiectasis however there is some subtle bronchiectasis appreciated by me. No honeycombing. Suggestive of subacute hypersensitivity pneumonitis. No pathologic mediastinal adenopathy.  CXR PA/LAT 02/14/16 (previously reviewed by me): Bronchial wall thickening. Chronic elevation left hemidiaphragm. No focal opacity or nodule appreciated. No pleural effusion. Mediastinum normal in contour. Heart normal in size.  MICROBIOLOGY Sputum Ctx (05/04/16): Few squamous epithelials / Oral Flora / AFB pending / Budding Yeast  LABS 06/03/16 CRP:  0.7 ESR:  8 ANA:  Negative Anti-CCP:  <16 DS DNA Ab:  <1 Smith Ab:  <1.0 Hypersensitivity Pneumonitis Panel:  Negative   05/04/16 IgE: 3 IgG: 694 IgA: 137 IgM: 244    Assessment & Plan:  60 y.o. 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 Patient's spirometry has significantly  worsened today. Despite this her 6 minute walk test shows no evidence of oxygen requirement. Symptomatically she seems to be very well-controlled at this time. I reviewed her serum lab tests which were negative from previous visit. I do question whether or not she may have an underlying atypical infectious process and therefore we will be checking a sputum culture. I instructed the patient to contact my office if she had any new breathing problems or questions before next appointment. I am holding off on initiating prednisone/immunosuppression therapy at this time without significant clinical worsening despite worsening in her spirometry. I'm also holding off on surgical lung biopsy at this time.  1. Ground Glass Opacities:  Repeat spirometry with DLCO and 6 minute walk test at next appointment. 2. Chronic Bronchitis: No exacerbations since last appointment. Checking sputum culture for AFB, fungus, and bacteria. 3. GERD: Checking esophagogram. Holding off on medication at this time. 4. Allergic Rhinitis: Continuing Benadryl as needed. 5. OSA: Continuing on CPAP indefinitely. Recommended using CPAP while she's napping.  6. Health Maintenance: Administering Pneumovax 23 today. Recommended influenza vaccine and 3-4 weeks. 7. Follow-up: Patient to return to clinic in 3 months or sooner if needed.  Sonia Baller Ashok Cordia, M.D. [facility] Pulmonary & Critical Care Pager:  [phone] After 3pm or if no response, call [phone] 4:12 PM 09/03/16

## 2016-09-07 ENCOUNTER — Ambulatory Visit (HOSPITAL_COMMUNITY): Payer: Medicare Other

## 2016-09-21 ENCOUNTER — Emergency Department (HOSPITAL_COMMUNITY): Payer: Medicare Other

## 2016-09-21 ENCOUNTER — Encounter (HOSPITAL_COMMUNITY): Payer: Self-pay | Admitting: Emergency Medicine

## 2016-09-21 ENCOUNTER — Ambulatory Visit (HOSPITAL_COMMUNITY): Admission: RE | Admit: 2016-09-21 | Payer: Medicare Other | Source: Ambulatory Visit

## 2016-09-21 ENCOUNTER — Emergency Department (HOSPITAL_COMMUNITY)
Admission: EM | Admit: 2016-09-21 | Discharge: 2016-09-21 | Disposition: A | Discharge status: Home / Self Care | Payer: Medicare Other | Attending: Emergency Medicine | Admitting: Emergency Medicine

## 2016-09-21 DIAGNOSIS — S52615A Nondisplaced fracture of left ulna styloid process, initial encounter for closed fracture: Secondary | ICD-10-CM | POA: Insufficient documentation

## 2016-09-21 DIAGNOSIS — S0001XA Abrasion of scalp, initial encounter: Secondary | ICD-10-CM | POA: Diagnosis not present

## 2016-09-21 DIAGNOSIS — Z79899 Other long term (current) drug therapy: Secondary | ICD-10-CM | POA: Insufficient documentation

## 2016-09-21 DIAGNOSIS — S52572A Other intraarticular fracture of lower end of left radius, initial encounter for closed fracture: Secondary | ICD-10-CM | POA: Diagnosis not present

## 2016-09-21 DIAGNOSIS — Y9289 Other specified places as the place of occurrence of the external cause: Secondary | ICD-10-CM | POA: Diagnosis not present

## 2016-09-21 DIAGNOSIS — W108XXA Fall (on) (from) other stairs and steps, initial encounter: Secondary | ICD-10-CM | POA: Diagnosis not present

## 2016-09-21 DIAGNOSIS — S0093XA Contusion of unspecified part of head, initial encounter: Secondary | ICD-10-CM | POA: Insufficient documentation

## 2016-09-21 DIAGNOSIS — Z87891 Personal history of nicotine dependence: Secondary | ICD-10-CM | POA: Insufficient documentation

## 2016-09-21 DIAGNOSIS — S50812A Abrasion of left forearm, initial encounter: Secondary | ICD-10-CM | POA: Insufficient documentation

## 2016-09-21 DIAGNOSIS — J45909 Unspecified asthma, uncomplicated: Secondary | ICD-10-CM | POA: Diagnosis not present

## 2016-09-21 DIAGNOSIS — F909 Attention-deficit hyperactivity disorder, unspecified type: Secondary | ICD-10-CM | POA: Diagnosis not present

## 2016-09-21 DIAGNOSIS — S52502A Unspecified fracture of the lower end of left radius, initial encounter for closed fracture: Secondary | ICD-10-CM | POA: Diagnosis not present

## 2016-09-21 DIAGNOSIS — Z7982 Long term (current) use of aspirin: Secondary | ICD-10-CM | POA: Diagnosis not present

## 2016-09-21 DIAGNOSIS — Y999 Unspecified external cause status: Secondary | ICD-10-CM | POA: Insufficient documentation

## 2016-09-21 DIAGNOSIS — S0083XA Contusion of other part of head, initial encounter: Secondary | ICD-10-CM | POA: Diagnosis not present

## 2016-09-21 DIAGNOSIS — S80812A Abrasion, left lower leg, initial encounter: Secondary | ICD-10-CM | POA: Insufficient documentation

## 2016-09-21 DIAGNOSIS — S90414A Abrasion, right lesser toe(s), initial encounter: Secondary | ICD-10-CM | POA: Insufficient documentation

## 2016-09-21 DIAGNOSIS — Y9389 Activity, other specified: Secondary | ICD-10-CM | POA: Diagnosis not present

## 2016-09-21 DIAGNOSIS — S0990XA Unspecified injury of head, initial encounter: Secondary | ICD-10-CM | POA: Diagnosis present

## 2016-09-21 DIAGNOSIS — S62102A Fracture of unspecified carpal bone, left wrist, initial encounter for closed fracture: Secondary | ICD-10-CM

## 2016-09-21 DIAGNOSIS — S50312A Abrasion of left elbow, initial encounter: Secondary | ICD-10-CM | POA: Diagnosis not present

## 2016-09-21 DIAGNOSIS — T07XXXA Unspecified multiple injuries, initial encounter: Secondary | ICD-10-CM

## 2016-09-21 MED ORDER — FENTANYL CITRATE (PF) 100 MCG/2ML IJ SOLN
50.0000 ug | Freq: Once | INTRAMUSCULAR | Status: AC
  Administered 2016-09-21: 50 ug via INTRAVENOUS
  Filled 2016-09-21: qty 2

## 2016-09-21 MED ORDER — BACITRACIN ZINC 500 UNIT/GM EX OINT
1.0000 | TOPICAL_OINTMENT | Freq: Once | CUTANEOUS | Status: AC
Start: 2016-09-21 — End: 2016-09-21
  Administered 2016-09-21: 1 via TOPICAL
  Filled 2016-09-21: qty 1.8

## 2016-09-21 NOTE — ED Triage Notes (Signed)
Pt states she got up to go to the restroom and got disoriented and went to step into the bathroom and stepped off the top step causing her to fall down 13 steps   Pt has a large knot on her forehead on the left side and has multiple abrasions noted to her left arm and leg  Pt denies LOC  Denies neck or back pain  Pt states she does not take blood thinners

## 2016-09-21 NOTE — Discharge Instructions (Signed)
Continue your home pain medications, apply antibiotic ointment daily to your abrasions,  keep the splint in place and follow up with an orthopedic doctor

## 2016-09-21 NOTE — ED Provider Notes (Signed)
[facility] DEPT Provider Note   CSN: [id number] Arrival date & time: 09/21/16  0509  Time seen 05:40 AM   History   Chief Complaint Chief Complaint  Patient presents with  . Fall    HPI Abigail Huerta is a 60 y.o. female.  HPI patient states she got up to go to the bathroom about 4:45 AM this morning and did not turn on the light in the hall. She states she got turned around and she stepped into the mid air and fell down about 13 stairs. She hit her head but denies loss of consciousness. She complained in her left shoulder, her left wrist and distal forearm area, her right middle finger, her right little toe, her left central chest, and her left lower leg. Patient states she has a history of arthritis. Patient is right-handed. Husband reports she's been alert and acting normal since the fall.  Of note earlier this evening patient did spill some gentian violet over her fingers while trying to treat her dog for rash.  PCP Dr Theodis Sato  Past Medical History:  Diagnosis Date  . ADD (attention deficit disorder)   . Anxiety   . Arthritis   . Asthma   . Bronchitis    12-23-15 being seen today for bronchitis, ? whether she will have Colonoscopy scheduled 01-05-16  . Cardiac murmur   . Carpal tunnel syndrome, bilateral   . Chronic pain    "chronic pain-arthritis back and spine"  . Depression   . Eczema   . Fibromyalgia   . Friction blisters of the palms    Uses Halobetasol  . 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 Hyperlipemia   . Lyme disease   . Osteoarthritis   . Osteopenia   . Sleep apnea    cpap setting at 9    Patient Active Problem List   Diagnosis Date Noted  . Opacity of lung on imaging study 06/03/2016  . Chronic bronchitis ([place]) 05/04/2016  . Allergic rhinitis 05/04/2016  . GERD (gastroesophageal reflux disease) 05/04/2016  . Arthritis 05/04/2016  . OSA on CPAP 05/04/2016  . S/P lumbar spinal fusion 12/26/2014  . 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 Expected  blood loss anemia 04/18/2013  . Obese 04/18/2013    Past Surgical History:  Procedure Laterality Date  . ABDOMINAL HYSTERECTOMY     1985  . BACK SURGERY    . EYE SURGERY Left    Retinal surgery  . HIP SURGERY Right 2005   Hip replacement  . JOINT REPLACEMENT    . KNEE ARTHROSCOPY    . LAPAROSCOPIC GASTRIC BANDING  2005  . MAXIMUM ACCESS (MAS)POSTERIOR LUMBAR INTERBODY FUSION (PLIF) 1 LEVEL N/A 12/26/2014   Procedure: FOR MAXIMUM ACCESS (MAS) POSTERIOR LUMBAR INTERBODY FUSION (PLIF) 1 LEVEL;  Surgeon: Eustace Moore, MD;  Location: [facility];  Service: Neurosurgery;  Laterality: N/A;  FOR MAXIMUM ACCESS (MAS) POSTERIOR LUMBAR INTERBODY FUSION (PLIF) 1 LEVEL LUMBAR 4-5  . TENDON REPAIR Right    forearm  . TOTAL HIP ARTHROPLASTY Left 04/17/2013   Procedure: TOTAL HIP ARTHROPLASTY ANTERIOR APPROACH;  Surgeon: Mauri Pole, MD;  Location: [facility];  Service: Orthopedics;  Laterality: Left;    OB History    No data available       Home Medications    Prior to Admission medications   Medication Sig Start Date End Date Taking? Authorizing Provider  albuterol (PROVENTIL HFA;VENTOLIN HFA) 108 (90 BASE) MCG/ACT inhaler Inhale 2 puffs  into the lungs every 6 (six) hours as needed for shortness of breath.    Historical Provider, MD  albuterol (PROVENTIL) (2.5 MG/3ML) 0.083% nebulizer solution Take 2.5 mg by nebulization every 6 (six) hours as needed for wheezing or shortness of breath.    Historical Provider, MD  aspirin 81 MG tablet Take 81 mg by mouth daily.    Historical Provider, MD  B Complex-C (B-COMPLEX WITH VITAMIN C) tablet Take 1 tablet by mouth daily.    Historical Provider, MD  buPROPion (WELLBUTRIN XL) 300 MG 24 hr tablet Take 300 mg by mouth daily.    Historical Provider, MD  dextromethorphan-guaiFENesin (MUCINEX DM) 30-600 MG 12hr tablet Take 1 tablet by mouth 2 (two) times daily as needed for cough.    Historical Provider, MD  diphenhydrAMINE (BENADRYL) 25 mg capsule Take  25-50 mg by mouth daily as needed for allergies.    Historical Provider, MD  escitalopram (LEXAPRO) 10 MG tablet Take 1 tablet by mouth daily. 04/22/16   Historical Provider, MD  fentaNYL (DURAGESIC - DOSED MCG/HR) 100 MCG/HR Place 100 mcg onto the skin every other day. New patch applied 01/13/2016    Historical Provider, MD  guaiFENesin (ROBITUSSIN) 100 MG/5ML liquid Take 200 mg by mouth 3 (three) times daily as needed for cough.    Historical Provider, MD  LORazepam (ATIVAN) 1 MG tablet Take 1 tablet by mouth as needed. 04/23/16   Historical Provider, MD  methylphenidate (RITALIN) 20 MG tablet Take 1 tablet by mouth 2 (two) times daily. 04/22/16   Historical Provider, MD  oxyCODONE-acetaminophen (PERCOCET) 10-325 MG tablet Take 1 tablet by mouth every 6 (six) hours as needed for pain.    Historical Provider, MD  promethazine (PHENERGAN) 25 MG tablet Take 25 mg by mouth every 6 (six) hours as needed for nausea.    Historical Provider, MD  Respiratory Therapy Supplies (FLUTTER) DEVI As directed 05/04/16   Javier Glazier, MD  tiZANidine (ZANAFLEX) 4 MG tablet Take 4 mg by mouth 4 (four) times daily.    Historical Provider, MD    Family History Family History  Problem Relation Age of Onset  . Lung disease Son   . Heart disease Father     Social History Social History  Substance Use Topics  . Smoking status: Former Smoker    Packs/day: 1.50    Years: 22.00    Types: Cigarettes    Start date: 01/16/1972    Quit date: 12/27/1993  . Smokeless tobacco: Never Used  . Alcohol use No  lives at home Lives with spouse On disability for arthritis   Allergies   Review of patient's allergies indicates no known allergies.   Review of Systems Review of Systems  All other systems reviewed and are negative.    Physical Exam Updated Vital Signs BP 155/95 (BP Location: Right Arm)   Pulse 73   Temp 97.8 F (36.6 C) (Oral)   Resp 15   SpO2 100%   Vital signs normal    Physical Exam    Constitutional: She is oriented to person, place, and time. She appears well-developed and well-nourished.  Non-toxic appearance. She does not appear ill. No distress.  HENT:  Head: Normocephalic.  Right Ear: External ear normal.  Left Ear: External ear normal.  Nose: Nose normal. No mucosal edema or rhinorrhea.  Mouth/Throat: Oropharynx is clear and moist and mucous membranes are normal. No dental abscesses or uvula swelling.  Patient has a moderate size contusion/abrasion in her left forehead/scalp  region.  Eyes: Conjunctivae and EOM are normal. Pupils are equal, round, and reactive to light.  Neck: Normal range of motion and full passive range of motion without pain. Neck supple.  Cardiovascular: Normal rate, regular rhythm and normal heart sounds.  Exam reveals no gallop and no friction rub.   No murmur heard. Pulmonary/Chest: Effort normal and breath sounds normal. No respiratory distress. She has no wheezes. She has no rhonchi. She has no rales. She exhibits no tenderness and no crepitus.    Patient's chest is nontender to stressing the chest wall, however she does have some localized pain in her left medial chest. There is no crepitance or bruising seen.  Abdominal: Soft. Normal appearance and bowel sounds are normal. She exhibits no distension. There is no tenderness. There is no rebound and no guarding.  Musculoskeletal: Normal range of motion. She exhibits no edema or tenderness.  Moves all extremities well except as noted below.  Patient has pain in her left shoulder with some bruising and swelling when she tries to abduct her left upper arm.  Patient has pain on the radial aspect of her left wrist and has pain on supination and with dorsi flexion. She does not have pain in her elbow although she has a superficial abrasion around her elbow.  Patient has good range of motion of her distal extremities and has no localizing pain in those joints.  She has tenderness over the  proximal phalanx of the right middle finger with out obvious deformity or swelling.  Neurological: She is alert and oriented to person, place, and time. She has normal strength. No cranial nerve deficit.  Skin: Skin is warm, dry and intact. No rash noted. No erythema. No pallor.  Patient's noted to have multiple bruising and superficial abrasions on her left lower leg with some mild swelling. She also has an abrasion on her right little toe that's painful. She has a linear abrasion on the left forearm.  Psychiatric: She has a normal mood and affect. Her speech is normal and behavior is normal. Her mood appears not anxious.  Nursing note and vitals reviewed.            ED Treatments / Results  Labs (all labs ordered are listed, but only abnormal results are displayed) Labs Reviewed - No data to display  EKG  EKG Interpretation None       Radiology Ct Head Wo Contrast  Result Date: 09/21/2016 CLINICAL DATA:  Fall down stairs. Forehead injury. Initial encounter. EXAM: CT HEAD WITHOUT CONTRAST CT CERVICAL SPINE WITHOUT CONTRAST TECHNIQUE: Multidetector CT imaging of the head and cervical spine was performed following the standard protocol without intravenous contrast. Multiplanar CT image reconstructions of the cervical spine were also generated. COMPARISON:  Head CT 02/25/2014 FINDINGS: CT HEAD FINDINGS Brain: Normal No evidence of acute infarction, hemorrhage, hydrocephalus, extra-axial collection or mass lesion/mass effect. Vascular: No hyperdense vessel or unexpected calcification. Skull: Left frontal scalp hematoma.  Negative for fracture. Sinuses/Orbits: No acute finding. CT CERVICAL SPINE FINDINGS Alignment: Normal. Skull base and vertebrae: No acute fracture. No primary bone lesion or focal pathologic process. Soft tissues and spinal canal: No prevertebral fluid or swelling. No visible canal hematoma. Disc levels:  No degenerative changes. Upper chest: No posttraumatic finding.  IMPRESSION: 1. No evidence of intracranial or cervical spine injury. 2. Left frontal scalp hematoma without calvarial fracture. Electronically Signed   By: Monte Fantasia M.D.   On: 09/21/2016 07:13   Ct Cervical Spine Wo Contrast  Result Date: 09/21/2016 CLINICAL DATA:  Fall down stairs. Forehead injury. Initial encounter. EXAM: CT HEAD WITHOUT CONTRAST CT CERVICAL SPINE WITHOUT CONTRAST TECHNIQUE: Multidetector CT imaging of the head and cervical spine was performed following the standard protocol without intravenous contrast. Multiplanar CT image reconstructions of the cervical spine were also generated. COMPARISON:  Head CT 02/25/2014 FINDINGS: CT HEAD FINDINGS Brain: Normal No evidence of acute infarction, hemorrhage, hydrocephalus, extra-axial collection or mass lesion/mass effect. Vascular: No hyperdense vessel or unexpected calcification. Skull: Left frontal scalp hematoma.  Negative for fracture. Sinuses/Orbits: No acute finding. CT CERVICAL SPINE FINDINGS Alignment: Normal. Skull base and vertebrae: No acute fracture. No primary bone lesion or focal pathologic process. Soft tissues and spinal canal: No prevertebral fluid or swelling. No visible canal hematoma. Disc levels:  No degenerative changes. Upper chest: No posttraumatic finding. IMPRESSION: 1. No evidence of intracranial or cervical spine injury. 2. Left frontal scalp hematoma without calvarial fracture. Electronically Signed   By: Monte Fantasia M.D.   On: 09/21/2016 07:13    Procedures Procedures (including critical care time)  Medications Ordered in ED Medications  fentaNYL (SUBLIMAZE) injection 50 mcg (50 mcg Intravenous Given 09/21/16 0636)     Initial Impression / Assessment and Plan / ED Course  I have reviewed the triage vital signs and the nursing notes.  Pertinent labs & imaging results that were available during my care of the patient were reviewed by me and considered in my medical decision making (see chart for  details).  Clinical Course  Comment By Time  Pt seen by Dr Rolland Porter.  Fell down the stairs last night when the lights were off.  Xrays are pending Dorie Rank, MD 09/26 [phone]   X-rays were obtained of all the areas that were painful and CT of the head was done. Due to distracting injuries CT of her neck was done.  Pt turned over to Dr Hillard Danker at change of shift to get results of her xrays.   Final Clinical Impressions(s) / ED Diagnoses   Final diagnoses:  Fall down stairs, initial encounter  Contusion of head, initial encounter  Abrasions of multiple sites    Disposition pending results of her xrays  Rolland Porter, MD, Barbette Or, MD 09/21/16 [phone]

## 2016-09-21 NOTE — ED Provider Notes (Signed)
Clinical Course  Comment By Time  Pt seen by Dr Rolland Porter.  Fell down the stairs last night when the lights were off.  Xrays are pending Dorie Rank, MD 09/26 [phone]    Xrays results reviewed.  Wrist fx noted on plain films.  No other fx or dislocation.  Will irrigate wounds. Splint left wrist.  Follow up with ortho   Dorie Rank, MD 09/21/16 [phone]

## 2016-11-17 ENCOUNTER — Telehealth: Payer: Self-pay | Admitting: Internal Medicine

## 2016-11-17 NOTE — Telephone Encounter (Signed)
Acute call - used to clean out silica, had a bird till last year, and used to smoke in past. HAs hx of :chronic bronchitis" PFT tis year isoldated low dlco. Autoimmune negative. CT may 2017 with UL GGO. Now with acute bronchitis for 1-2days. Asking for prednisone and antibiotic  Plan Z pak  Please take prednisone 40 mg x1 day, then 30 mg x1 day, then 20 mg x1 day, then 10 mg x1 day, and then 5 mg x1 day and stop  Recommended fu HRCT in dec-feb and if UL infiltrat present to consider BAL/lung bx (at discretion of Dr Ashok Cordia - she is sto discuss this topic with him)  [facility] on [address] -> prescriptim message left on their voice mail [phone] 3:46 PM 11/17/2016  Dr. Brand Males, M.D., F.C.C.P Pulmonary and Critical Care Medicine Staff Physician [facility] Pager: [phone], If no answer or between  15:00h - 7:00h: call [phone]  11/17/2016 3:47 PM

## 2016-12-06 ENCOUNTER — Encounter: Payer: Medicare Other | Admitting: Pulmonary Disease

## 2016-12-06 ENCOUNTER — Ambulatory Visit: Payer: Medicare Other | Admitting: Pulmonary Disease

## 2016-12-06 ENCOUNTER — Ambulatory Visit: Payer: Medicare Other

## 2016-12-13 DIAGNOSIS — S52532D Colles' fracture of left radius, subsequent encounter for closed fracture with routine healing: Secondary | ICD-10-CM | POA: Diagnosis not present

## 2016-12-23 DIAGNOSIS — Z23 Encounter for immunization: Secondary | ICD-10-CM | POA: Diagnosis not present

## 2016-12-23 DIAGNOSIS — J410 Simple chronic bronchitis: Secondary | ICD-10-CM | POA: Diagnosis not present

## 2016-12-23 DIAGNOSIS — F902 Attention-deficit hyperactivity disorder, combined type: Secondary | ICD-10-CM | POA: Diagnosis not present

## 2016-12-23 DIAGNOSIS — F411 Generalized anxiety disorder: Secondary | ICD-10-CM | POA: Diagnosis not present

## 2016-12-23 DIAGNOSIS — Z1231 Encounter for screening mammogram for malignant neoplasm of breast: Secondary | ICD-10-CM | POA: Diagnosis not present

## 2016-12-23 DIAGNOSIS — L309 Dermatitis, unspecified: Secondary | ICD-10-CM | POA: Insufficient documentation

## 2016-12-23 DIAGNOSIS — Z1211 Encounter for screening for malignant neoplasm of colon: Secondary | ICD-10-CM | POA: Diagnosis not present

## 2016-12-23 DIAGNOSIS — G894 Chronic pain syndrome: Secondary | ICD-10-CM | POA: Diagnosis not present

## 2016-12-23 DIAGNOSIS — F329 Major depressive disorder, single episode, unspecified: Secondary | ICD-10-CM | POA: Diagnosis not present

## 2017-01-07 ENCOUNTER — Ambulatory Visit (INDEPENDENT_AMBULATORY_CARE_PROVIDER_SITE_OTHER): Payer: Medicare Other | Admitting: Pulmonary Disease

## 2017-01-07 ENCOUNTER — Encounter: Payer: Self-pay | Admitting: Pulmonary Disease

## 2017-01-07 VITALS — BP 130/70 | HR 68 | Ht 67.0 in | Wt 208.0 lb

## 2017-01-07 DIAGNOSIS — R918 Other nonspecific abnormal finding of lung field: Secondary | ICD-10-CM

## 2017-01-07 DIAGNOSIS — J42 Unspecified chronic bronchitis: Secondary | ICD-10-CM

## 2017-01-07 LAB — PULMONARY FUNCTION TEST
DL/VA % PRED: 91 %
DL/VA: 4.71 ml/min/mmHg/L
DLCO COR % PRED: 87 %
DLCO COR: 24.88 ml/min/mmHg
DLCO unc % pred: 79 %
DLCO unc: 22.44 ml/min/mmHg
FEF 25-75 Pre: 2.9 L/sec
FEF2575-%Pred-Pre: 120 %
FEV1-%Pred-Pre: 100 %
FEV1-Pre: 2.79 L
FEV1FVC-%Pred-Pre: 105 %
FEV6-%Pred-Pre: 99 %
FEV6-Pre: 3.44 L
FEV6FVC-%Pred-Pre: 104 %
FVC-%PRED-PRE: 95 %
FVC-Pre: 3.44 L
PRE FEV6/FVC RATIO: 100 %
Pre FEV1/FVC ratio: 81 %

## 2017-01-07 NOTE — Progress Notes (Signed)
Test reviewed.  

## 2017-01-07 NOTE — Progress Notes (Signed)
PFT 01/07/17: FVC 3.44 L (95%) FEV1 2.79 L (100%) FEV1/FVC 0.81 FEF 25-75 2.90 L (120%) DLCO corrected 87% (Hgb 10.6)  6MWT 01/07/17:  Walked 432 meters / Baseline Sat 99% on RA / Nadir Sat 96% on RA 2 min after walk (complained of leg pain)

## 2017-01-08 NOTE — Progress Notes (Signed)
Patient had to leave before being seen.

## 2017-02-17 DIAGNOSIS — F411 Generalized anxiety disorder: Secondary | ICD-10-CM | POA: Diagnosis not present

## 2017-02-17 DIAGNOSIS — F902 Attention-deficit hyperactivity disorder, combined type: Secondary | ICD-10-CM | POA: Diagnosis not present

## 2017-02-17 DIAGNOSIS — F329 Major depressive disorder, single episode, unspecified: Secondary | ICD-10-CM | POA: Diagnosis not present

## 2017-02-18 DIAGNOSIS — M546 Pain in thoracic spine: Secondary | ICD-10-CM | POA: Diagnosis not present

## 2017-02-18 DIAGNOSIS — M5416 Radiculopathy, lumbar region: Secondary | ICD-10-CM | POA: Diagnosis not present

## 2017-02-18 DIAGNOSIS — M545 Low back pain: Secondary | ICD-10-CM | POA: Diagnosis not present

## 2017-02-18 DIAGNOSIS — M5417 Radiculopathy, lumbosacral region: Secondary | ICD-10-CM | POA: Diagnosis not present

## 2017-02-18 DIAGNOSIS — M4726 Other spondylosis with radiculopathy, lumbar region: Secondary | ICD-10-CM | POA: Diagnosis not present

## 2017-02-25 DIAGNOSIS — S0502XA Injury of conjunctiva and corneal abrasion without foreign body, left eye, initial encounter: Secondary | ICD-10-CM | POA: Diagnosis not present

## 2017-04-18 DIAGNOSIS — F411 Generalized anxiety disorder: Secondary | ICD-10-CM | POA: Diagnosis not present

## 2017-04-18 DIAGNOSIS — F329 Major depressive disorder, single episode, unspecified: Secondary | ICD-10-CM | POA: Diagnosis not present

## 2017-04-18 DIAGNOSIS — F902 Attention-deficit hyperactivity disorder, combined type: Secondary | ICD-10-CM | POA: Diagnosis not present

## 2017-04-21 DIAGNOSIS — H31012 Macula scars of posterior pole (postinflammatory) (post-traumatic), left eye: Secondary | ICD-10-CM | POA: Diagnosis not present

## 2017-04-21 DIAGNOSIS — H2513 Age-related nuclear cataract, bilateral: Secondary | ICD-10-CM | POA: Diagnosis not present

## 2017-04-21 DIAGNOSIS — B5801 Toxoplasma chorioretinitis: Secondary | ICD-10-CM | POA: Diagnosis not present

## 2017-04-21 DIAGNOSIS — H25013 Cortical age-related cataract, bilateral: Secondary | ICD-10-CM | POA: Diagnosis not present

## 2017-07-01 DIAGNOSIS — G894 Chronic pain syndrome: Secondary | ICD-10-CM | POA: Diagnosis not present

## 2017-07-01 DIAGNOSIS — M5134 Other intervertebral disc degeneration, thoracic region: Secondary | ICD-10-CM | POA: Diagnosis not present

## 2017-07-01 DIAGNOSIS — M5136 Other intervertebral disc degeneration, lumbar region: Secondary | ICD-10-CM | POA: Diagnosis not present

## 2017-07-11 DIAGNOSIS — F411 Generalized anxiety disorder: Secondary | ICD-10-CM | POA: Diagnosis not present

## 2017-07-11 DIAGNOSIS — F902 Attention-deficit hyperactivity disorder, combined type: Secondary | ICD-10-CM | POA: Diagnosis not present

## 2017-07-11 DIAGNOSIS — F329 Major depressive disorder, single episode, unspecified: Secondary | ICD-10-CM | POA: Diagnosis not present

## 2017-07-12 DIAGNOSIS — G5602 Carpal tunnel syndrome, left upper limb: Secondary | ICD-10-CM | POA: Diagnosis not present

## 2017-07-12 DIAGNOSIS — G5601 Carpal tunnel syndrome, right upper limb: Secondary | ICD-10-CM | POA: Diagnosis not present

## 2017-07-15 DIAGNOSIS — M5117 Intervertebral disc disorders with radiculopathy, lumbosacral region: Secondary | ICD-10-CM | POA: Diagnosis not present

## 2017-07-15 DIAGNOSIS — M5136 Other intervertebral disc degeneration, lumbar region: Secondary | ICD-10-CM | POA: Diagnosis not present

## 2017-07-15 DIAGNOSIS — M546 Pain in thoracic spine: Secondary | ICD-10-CM | POA: Diagnosis not present

## 2017-07-15 DIAGNOSIS — M545 Low back pain: Secondary | ICD-10-CM | POA: Diagnosis not present

## 2017-08-01 DIAGNOSIS — G5602 Carpal tunnel syndrome, left upper limb: Secondary | ICD-10-CM | POA: Diagnosis not present

## 2017-08-12 DIAGNOSIS — G5601 Carpal tunnel syndrome, right upper limb: Secondary | ICD-10-CM | POA: Diagnosis not present

## 2017-08-12 DIAGNOSIS — G5602 Carpal tunnel syndrome, left upper limb: Secondary | ICD-10-CM | POA: Diagnosis not present

## 2017-08-31 DIAGNOSIS — G5601 Carpal tunnel syndrome, right upper limb: Secondary | ICD-10-CM | POA: Diagnosis not present

## 2017-09-15 DIAGNOSIS — G5601 Carpal tunnel syndrome, right upper limb: Secondary | ICD-10-CM | POA: Diagnosis not present

## 2017-10-10 DIAGNOSIS — F909 Attention-deficit hyperactivity disorder, unspecified type: Secondary | ICD-10-CM | POA: Diagnosis not present

## 2017-10-10 DIAGNOSIS — F329 Major depressive disorder, single episode, unspecified: Secondary | ICD-10-CM | POA: Diagnosis not present

## 2017-10-10 DIAGNOSIS — F411 Generalized anxiety disorder: Secondary | ICD-10-CM | POA: Diagnosis not present

## 2017-10-17 DIAGNOSIS — G5601 Carpal tunnel syndrome, right upper limb: Secondary | ICD-10-CM | POA: Diagnosis not present

## 2017-11-11 DIAGNOSIS — M5416 Radiculopathy, lumbar region: Secondary | ICD-10-CM | POA: Diagnosis not present

## 2017-11-11 DIAGNOSIS — M5134 Other intervertebral disc degeneration, thoracic region: Secondary | ICD-10-CM | POA: Diagnosis not present

## 2017-11-11 DIAGNOSIS — M546 Pain in thoracic spine: Secondary | ICD-10-CM | POA: Diagnosis not present

## 2017-11-11 DIAGNOSIS — M5417 Radiculopathy, lumbosacral region: Secondary | ICD-10-CM | POA: Diagnosis not present

## 2017-11-11 DIAGNOSIS — M4726 Other spondylosis with radiculopathy, lumbar region: Secondary | ICD-10-CM | POA: Diagnosis not present

## 2018-01-02 ENCOUNTER — Emergency Department (HOSPITAL_COMMUNITY): Payer: Medicare Other

## 2018-01-02 ENCOUNTER — Emergency Department (HOSPITAL_COMMUNITY)
Admission: EM | Admit: 2018-01-02 | Discharge: 2018-01-02 | Disposition: A | Discharge status: Home / Self Care | Payer: Medicare Other | Attending: Emergency Medicine | Admitting: Emergency Medicine

## 2018-01-02 ENCOUNTER — Encounter (HOSPITAL_COMMUNITY): Payer: Self-pay | Admitting: Emergency Medicine

## 2018-01-02 ENCOUNTER — Other Ambulatory Visit: Payer: Self-pay

## 2018-01-02 DIAGNOSIS — J45909 Unspecified asthma, uncomplicated: Secondary | ICD-10-CM | POA: Diagnosis not present

## 2018-01-02 DIAGNOSIS — Z87891 Personal history of nicotine dependence: Secondary | ICD-10-CM | POA: Diagnosis not present

## 2018-01-02 DIAGNOSIS — R070 Pain in throat: Secondary | ICD-10-CM | POA: Insufficient documentation

## 2018-01-02 DIAGNOSIS — R0789 Other chest pain: Secondary | ICD-10-CM | POA: Diagnosis not present

## 2018-01-02 DIAGNOSIS — Z79899 Other long term (current) drug therapy: Secondary | ICD-10-CM | POA: Diagnosis not present

## 2018-01-02 DIAGNOSIS — R6884 Jaw pain: Secondary | ICD-10-CM | POA: Diagnosis not present

## 2018-01-02 DIAGNOSIS — R079 Chest pain, unspecified: Secondary | ICD-10-CM | POA: Diagnosis present

## 2018-01-02 DIAGNOSIS — G8929 Other chronic pain: Secondary | ICD-10-CM | POA: Insufficient documentation

## 2018-01-02 DIAGNOSIS — R1013 Epigastric pain: Secondary | ICD-10-CM | POA: Diagnosis not present

## 2018-01-02 LAB — CBC
HCT: 38.8 % (ref 36.0–46.0)
HEMOGLOBIN: 13 g/dL (ref 12.0–15.0)
MCH: 27.9 pg (ref 26.0–34.0)
MCHC: 33.5 g/dL (ref 30.0–36.0)
MCV: 83.3 fL (ref 78.0–100.0)
PLATELETS: 212 10*3/uL (ref 150–400)
RBC: 4.66 MIL/uL (ref 3.87–5.11)
RDW: 13.6 % (ref 11.5–15.5)
WBC: 8.2 10*3/uL (ref 4.0–10.5)

## 2018-01-02 LAB — BASIC METABOLIC PANEL
Anion gap: 8 (ref 5–15)
BUN: 12 mg/dL (ref 6–20)
CHLORIDE: 104 mmol/L (ref 101–111)
CO2: 23 mmol/L (ref 22–32)
CREATININE: 1.03 mg/dL — AB (ref 0.44–1.00)
Calcium: 9.2 mg/dL (ref 8.9–10.3)
GFR calc Af Amer: >60 mL/min (ref >60)
GFR calc non Af Amer: 57 mL/min — ABNORMAL LOW (ref >60)
GLUCOSE: 103 mg/dL — AB (ref 65–99)
Potassium: 4.2 mmol/L (ref 3.5–5.1)
Sodium: 135 mmol/L (ref 135–145)

## 2018-01-02 LAB — I-STAT TROPONIN, ED
Troponin i, poc: 0 ng/mL (ref 0.00–0.08)
Troponin i, poc: 0 ng/mL (ref 0.00–0.08)

## 2018-01-02 NOTE — ED Triage Notes (Signed)
Pt. Stated, Im having pressure all on the right side of my chest  And going around to my back.But im having this hot feeling in the back of my throat and the top of my mouth. This started about a hour ago and I always take a couple of ASA and it would go away, this time it did not.

## 2018-01-02 NOTE — ED Notes (Signed)
Pt is resting and appears comfortable.  Family is at bedside,.

## 2018-01-02 NOTE — ED Provider Notes (Signed)
[facility] DEPART[facility] Provider Note   CSN: [id number] Arrival date & time: 01/02/18  1640     History   Chief Complaint Chief Complaint  Patient presents with  . Chest Pain  . Jaw Pain    HPI Abigail Huerta is a 62 y.o. female.  Patient with PMH of fibromyalgia, asthma, anxiety, and HL presents to the ED with a chief complaint of burning in her throat and jaw.  She states that she began experiencing the symptoms around lunch time today.  She states that the burning started in her throat and move to her chest and then upper abdomen.  She denies any SOB, nausea, diaphoresis, or radiating pain to her arm/shoulder.  She states that she took an aspirin and an omeprazole, which normally helps with the symptoms.  She states that these symptoms occur intermittently from time to time, but that the persistent nature of the symptoms brought to the ER today.  She states that shortly after arriving, the symptoms subsided.  She denies any symptoms now.  She denies any history of heart disease.   The history is provided by the patient. No language interpreter was used.    Past Medical History:  Diagnosis Date  . ADD (attention deficit disorder)   . Anxiety   . Arthritis   . Asthma   . Bronchitis    12-23-15 being seen today for bronchitis, ? whether she will have Colonoscopy scheduled 01-05-16  . Cardiac murmur   . Carpal tunnel syndrome, bilateral   . Chronic pain    "chronic pain-arthritis back and spine"  . Depression   . Eczema   . Fibromyalgia   . Friction blisters of the palms    Uses Halobetasol  . 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 Hyperlipemia   . Lyme disease   . Osteoarthritis   . Osteopenia   . Sleep apnea    cpap setting at 9    Patient Active Problem List   Diagnosis Date Noted  . Opacity of lung on imaging study 06/03/2016  . Chronic bronchitis ([place]) 05/04/2016  . Allergic rhinitis 05/04/2016  . GERD (gastroesophageal reflux  disease) 05/04/2016  . Arthritis 05/04/2016  . OSA on CPAP 05/04/2016  . S/P lumbar spinal fusion 12/26/2014  . 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 Expected blood loss anemia 04/18/2013  . Obese 04/18/2013    Past Surgical History:  Procedure Laterality Date  . ABDOMINAL HYSTERECTOMY     1985  . BACK SURGERY    . EYE SURGERY Left    Retinal surgery  . HIP SURGERY Right 2005   Hip replacement  . JOINT REPLACEMENT    . KNEE ARTHROSCOPY    . LAPAROSCOPIC GASTRIC BANDING  2005  . MAXIMUM ACCESS (MAS)POSTERIOR LUMBAR INTERBODY FUSION (PLIF) 1 LEVEL N/A 12/26/2014   Procedure: FOR MAXIMUM ACCESS (MAS) POSTERIOR LUMBAR INTERBODY FUSION (PLIF) 1 LEVEL;  Surgeon: Eustace Moore, MD;  Location: [facility];  Service: Neurosurgery;  Laterality: N/A;  FOR MAXIMUM ACCESS (MAS) POSTERIOR LUMBAR INTERBODY FUSION (PLIF) 1 LEVEL LUMBAR 4-5  . TENDON REPAIR Right    forearm  . TOTAL HIP ARTHROPLASTY Left 04/17/2013   Procedure: TOTAL HIP ARTHROPLASTY ANTERIOR APPROACH;  Surgeon: Mauri Pole, MD;  Location: [facility];  Service: Orthopedics;  Laterality: Left;    OB History    No data available       Home Medications    Prior to Admission medications  Medication Sig Start Date End Date Taking? Authorizing Provider  albuterol (PROVENTIL HFA;VENTOLIN HFA) 108 (90 BASE) MCG/ACT inhaler Inhale 2 puffs into the lungs every 6 (six) hours as needed for shortness of breath.    [provider]  albuterol (PROVENTIL) (2.5 MG/3ML) 0.083% nebulizer solution Take 2.5 mg by nebulization every 6 (six) hours as needed for wheezing or shortness of breath.    [provider]  aspirin 81 MG tablet Take 81 mg by mouth daily.    [provider]  buPROPion (WELLBUTRIN XL) 300 MG 24 hr tablet Take 300 mg by mouth daily.    [provider]  dextromethorphan-guaiFENesin (MUCINEX DM) 30-600 MG 12hr tablet Take 1 tablet by mouth 2 (two) times daily as needed for cough.    [provider]  diphenhydrAMINE (BENADRYL) 25 mg capsule Take 25-50 mg by mouth daily as needed for allergies.    [provider]  escitalopram (LEXAPRO) 10 MG tablet Take 1 tablet by mouth daily. 04/22/16   [provider]  fentaNYL (DURAGESIC - DOSED MCG/HR) 100 MCG/HR Place 100 mcg onto the skin every other day. New patch applied 01/13/2016    [provider]  LORazepam (ATIVAN) 1 MG tablet Take 2 tablets by mouth at bedtime.  04/23/16   [provider]  methylphenidate (RITALIN) 20 MG tablet Take 1 tablet by mouth 2 (two) times daily. 04/22/16   [provider]  oxyCODONE-acetaminophen (PERCOCET) 10-325 MG tablet Take 1 tablet by mouth every 6 (six) hours as needed for pain.    [provider]  promethazine (PHENERGAN) 25 MG tablet Take 25 mg by mouth every 6 (six) hours as needed for nausea.    [provider]  tiZANidine (ZANAFLEX) 4 MG tablet Take 4 mg by mouth 4 (four) times daily.    [provider]    Family History Family History  Problem Relation Age of Onset  . Lung disease Son   . Heart disease Father     Social History Social History   Tobacco Use  . Smoking status: Former Smoker    Packs/day: 1.50    Years: 22.00    Pack years: 33.00    Types: Cigarettes    Start date: 01/16/1972    Last attempt to quit: 12/27/1993    Years since quitting: 24.0  . Smokeless tobacco: Never Used  Substance Use Topics  . Alcohol use: No    Alcohol/week: 0.0 oz  . Drug use: No     Allergies   Patient has no known allergies.   Review of Systems Review of Systems  All other systems reviewed and are negative.    Physical Exam Updated Vital Signs BP (!) 133/93 (BP Location: Left Arm)   Pulse 75   Temp 98.7 F (37.1 C) (Oral)   Resp 16   Ht 5' 7.75" (1.721 m)   Wt 90.7 kg (200 lb)   SpO2 97%   BMI 30.63 kg/m   Physical Exam  Constitutional: She is oriented to person, place, and time. She appears  well-developed and well-nourished.  HENT:  Head: Normocephalic and atraumatic.  Eyes: Conjunctivae and EOM are normal. Pupils are equal, round, and reactive to light.  Neck: Normal range of motion. Neck supple.  Cardiovascular: Normal rate and regular rhythm. Exam reveals no gallop and no friction rub.  No murmur heard. Pulmonary/Chest: Effort normal and breath sounds normal. No respiratory distress. She has no wheezes. She has no rales. She exhibits no tenderness.  Abdominal: Soft. Bowel sounds are normal. She exhibits no distension and no mass. There is no tenderness. There is no rebound and no guarding.  Musculoskeletal: Normal range of motion. She exhibits no edema or tenderness.  Neurological: She is alert and oriented to person, place, and time.  Skin: Skin is warm and dry.  Psychiatric: She has a normal mood and affect. Her behavior is normal. Judgment and thought content normal.  Nursing note and vitals reviewed.    ED Treatments / Results  Labs (all labs ordered are listed, but only abnormal results are displayed) Labs Reviewed  BASIC METABOLIC PANEL - Abnormal; Notable for the following components:      Result Value   Glucose, Bld 103 (*)    Creatinine, Ser 1.03 (*)    GFR calc non Af Amer 57 (*)    All other components within normal limits  CBC  I-STAT TROPONIN, ED  I-STAT TROPONIN, ED    EKG  EKG Interpretation  Date/Time:  Monday January 02 2018 16:48:58 EST Ventricular Rate:  78 PR Interval:  146 QRS Duration: 106 QT Interval:  380 QTC Calculation: 433 R Axis:   7 Text Interpretation:  Normal sinus rhythm Incomplete right bundle branch block Borderline ECG No significant change since last tracing Confirmed by Wandra Arthurs [phone]) on 01/02/2018 9:15:44 PM       Radiology Dg Chest 2 View  Result Date: 01/02/2018 CLINICAL DATA:  Chest pain EXAM: CHEST  2 VIEW COMPARISON:  Chest radiograph 09/21/2016 FINDINGS: The heart size and mediastinal contours are within  normal limits. Both lungs are clear. The visualized skeletal structures are unremarkable. IMPRESSION: No active cardiopulmonary disease. Electronically Signed   By: Ulyses Jarred M.D.   On: 01/02/2018 17:33    Procedures Procedures (including critical care time)  Medications Ordered in ED Medications - No data to display   Initial Impression / Assessment and Plan / ED Course  I have reviewed the triage vital signs and the nursing notes.  Pertinent labs & imaging results that were available during my care of the patient were reviewed by me and considered in my medical decision making (see chart for details).     Patient presents with burning sensation in throat, chest, and epigastrium.  Symptoms occur intermittently and have done so for quite a while.  She normally takes omeprazole and aspirin with good relief.  DDx includes ACS, PE, pneumothorax, aortic dissection, esophageal rupture, pericarditis, chest wall pain.  Doubt ACS, normal troponin, no ischemic EKG findings, HEART score is: 2.  Well's PE score is 0, patient is not tachycardic nor hypoxic.  No evidence of pneumothorax on CXR.  Doubt dissection, no mediastinal widening on CXR, no ripping/tearing chest pain, neurovascularly intact.  Doubt pericarditis, no positional changes, or diffuse ST elevations on EKG.  Pain is not reproducible, doubt MSK.  Delta troponin is negative.  Patient is currently pain free.  Recommend close follow-up with PCP and/or cardiology as needed.  10:28 PM Patient reassessed.  Still pain free.  All findings were discussed with patient.  Patient understands and agrees with the plan.     Final Clinical Impressions(s) / ED Diagnoses   Final diagnoses:  Chest pain, unspecified type    ED Discharge Orders    None       Montine Circle, PA-C 01/02/18 2228    Drenda Freeze, MD 01/02/18 2356

## 2018-01-09 ENCOUNTER — Other Ambulatory Visit: Payer: Self-pay | Admitting: Internal Medicine

## 2018-01-09 DIAGNOSIS — Z1231 Encounter for screening mammogram for malignant neoplasm of breast: Secondary | ICD-10-CM

## 2018-01-09 DIAGNOSIS — Z79891 Long term (current) use of opiate analgesic: Secondary | ICD-10-CM | POA: Insufficient documentation

## 2018-01-27 ENCOUNTER — Inpatient Hospital Stay: Admission: RE | Admit: 2018-01-27 | Payer: Medicare Other | Source: Ambulatory Visit

## 2018-01-27 ENCOUNTER — Ambulatory Visit
Admission: RE | Admit: 2018-01-27 | Discharge: 2018-01-27 | Disposition: A | Discharge status: Home / Self Care | Payer: Medicare Other | Source: Ambulatory Visit | Attending: Internal Medicine | Admitting: Internal Medicine

## 2018-01-27 DIAGNOSIS — Z1231 Encounter for screening mammogram for malignant neoplasm of breast: Secondary | ICD-10-CM

## 2018-03-02 DIAGNOSIS — M5136 Other intervertebral disc degeneration, lumbar region: Secondary | ICD-10-CM | POA: Insufficient documentation

## 2018-03-20 DIAGNOSIS — M25551 Pain in right hip: Secondary | ICD-10-CM | POA: Insufficient documentation

## 2018-04-26 DIAGNOSIS — E538 Deficiency of other specified B group vitamins: Secondary | ICD-10-CM | POA: Insufficient documentation

## 2018-06-08 ENCOUNTER — Inpatient Hospital Stay: Admit: 2018-06-08 | Payer: Medicare Other | Admitting: Orthopedic Surgery

## 2018-06-08 SURGERY — TOTAL HIP REVISION
Anesthesia: Spinal | Site: Hip | Laterality: Right

## 2018-10-02 DIAGNOSIS — F330 Major depressive disorder, recurrent, mild: Secondary | ICD-10-CM | POA: Insufficient documentation

## 2018-12-13 NOTE — H&P (Signed)
TOTAL HIP REVISION ADMISSION H&P  Patient is admitted for right revision total hip arthroplasty.  Subjective:  Chief Complaint: Failed right THA - metalosis  HPI: Abigail Huerta, 62 y.o. 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 The indications for the revision total hip arthroplasty are bearing surface wear leading to  symptomatic synovitis.  Onset of symptoms was gradual starting 2+ years ago with gradually worsening course since that time.  Prior procedures on the right hip include arthroplasty.  Patient currently rates pain in the right hip at 8 out of 10 with activity.  There is night pain, worsening of pain with activity and weight bearing, trendelenberg gait, pain that interfers with activities of daily living and pain with passive range of motion. Patient has evidence of metal-on-metal THA by imaging studies.  This condition presents safety issues increasing the risk of falls.  There is no current active infection.  Risks, benefits and expectations were discussed with the patient.  Risks including but not limited to the risk of anesthesia, blood clots, nerve damage, blood vessel damage, failure of the prosthesis, infection and up to and including death.  Patient understand the risks, benefits and expectations and wishes to proceed with surgery.   PCP: Loraine Leriche., MD  D/C Plans:       Home  Post-op Meds:       No Rx given   Tranexamic Acid:      To be given - IV   Decadron:      Is to be given  FYI:      ASA  Percocet (on prior to surgery)  Duragesic Patch (on prior to surgery)  DME:   Pt already has equipment   PT:   No PT    Patient Active Problem List   Diagnosis Date Noted  . Opacity of lung on imaging study  06/03/2016  . Chronic bronchitis ([place]) 05/04/2016  . Allergic rhinitis 05/04/2016  . GERD (gastroesophageal reflux disease) 05/04/2016  . Arthritis 05/04/2016  . OSA on CPAP 05/04/2016  . S/P lumbar spinal fusion 12/26/2014  . 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 Expected blood loss anemia 04/18/2013  . Obese 04/18/2013   Past Medical History:  Diagnosis Date  . ADD (attention deficit disorder)   . Anxiety   . Arthritis   . Asthma   . Bronchitis    12-23-15 being seen today for bronchitis, ? whether she will have Colonoscopy scheduled 01-05-16  . Cardiac murmur   . Carpal tunnel syndrome, bilateral   . Chronic pain    "chronic pain-arthritis back and spine"  . Depression   . Eczema   . Fibromyalgia   . Friction blisters of the palms    Uses Halobetasol  . 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 Hyperlipemia   . Lyme disease   . Osteoarthritis   . Osteopenia   . Sleep apnea    cpap setting at 9    Past Surgical History:  Procedure Laterality Date  . ABDOMINAL HYSTERECTOMY     1985  . BACK SURGERY    . EYE SURGERY Left    Retinal surgery  . HIP SURGERY Right 2005   Hip replacement  . JOINT REPLACEMENT    . KNEE ARTHROSCOPY    .  LAPAROSCOPIC GASTRIC BANDING  2005  . MAXIMUM ACCESS (MAS)POSTERIOR LUMBAR INTERBODY FUSION (PLIF) 1 LEVEL N/A 12/26/2014   Procedure: FOR MAXIMUM ACCESS (MAS) POSTERIOR LUMBAR INTERBODY FUSION (PLIF) 1 LEVEL;  Surgeon: Eustace Moore, MD;  Location: [facility];  Service: Neurosurgery;  Laterality: N/A;  FOR MAXIMUM ACCESS (MAS) POSTERIOR LUMBAR INTERBODY FUSION (PLIF) 1 LEVEL LUMBAR 4-5  . TENDON REPAIR Right    forearm  . TOTAL HIP ARTHROPLASTY Left 04/17/2013   Procedure: TOTAL HIP ARTHROPLASTY ANTERIOR APPROACH;  Surgeon: Mauri Pole, MD;  Location: [facility];  Service: Orthopedics;  Laterality: Left;    No current facility-administered medications for this encounter.    Current Outpatient Medications  Medication Sig Dispense Refill Last  Dose  . albuterol (PROVENTIL HFA;VENTOLIN HFA) 108 (90 BASE) MCG/ACT inhaler Inhale 2 puffs into the lungs every 6 (six) hours as needed for shortness of breath.   Past Month at prn  . albuterol (PROVENTIL) (2.5 MG/3ML) 0.083% nebulizer solution Take 2.5 mg by nebulization every 6 (six) hours as needed for wheezing or shortness of breath.   unknnown at prn  . AMITIZA 24 MCG capsule Take 24 mcg by mouth daily as needed.  5 Past Week at prn  . aspirin 81 MG tablet Take 81 mg by mouth daily.   01/02/2018 at Unknown time  . buPROPion (WELLBUTRIN XL) 300 MG 24 hr tablet Take 300 mg by mouth daily.   01/02/2018 at Unknown time  . diphenhydrAMINE (BENADRYL) 25 mg capsule Take 25-50 mg by mouth daily as needed for allergies.   01/01/2018 at prn  . escitalopram (LEXAPRO) 20 MG tablet Take 20 mg by mouth daily.   3 01/02/2018 at Unknown time  . fentaNYL (DURAGESIC - DOSED MCG/HR) 100 MCG/HR Place 100 mcg onto the skin every other day. New patch applied 01/13/2016   01/02/2018 at Unknown time  . halobetasol (ULTRAVATE) 0.05 % ointment Apply 1 application topically daily as needed.   unknown at prn  . LORazepam (ATIVAN) 1 MG tablet Take 2 tablets by mouth at bedtime.   0 01/01/2018 at Unknown time  . methylphenidate (RITALIN) 20 MG tablet Take 1 tablet by mouth 2 (two) times daily.  0 01/02/2018 at Unknown time  . oxyCODONE-acetaminophen (PERCOCET) 10-325 MG tablet Take 1 tablet by mouth every 6 (six) hours as needed for pain.   01/02/2018 at prn  . promethazine (PHENERGAN) 25 MG tablet Take 25 mg by mouth every 6 (six) hours as needed for nausea.   01/01/2018 at prn  . tiZANidine (ZANAFLEX) 4 MG tablet Take 4 mg by mouth every 6 (six) hours as needed.    Past Week at prn   No Known Allergies  Social History   Tobacco Use  . Smoking status: Former Smoker    Packs/day: 1.50    Years: 22.00    Pack years: 33.00    Types: Cigarettes    Start date: 01/16/1972    Last attempt to quit: 12/27/1993    Years since quitting: 24.9   . Smokeless tobacco: Never Used  Substance Use Topics  . Alcohol use: No    Alcohol/week: 0.0 standard drinks    Family History  Problem Relation Age of Onset  . Lung disease Son   . Heart disease Father       Review of Systems  Constitutional: Negative.   HENT: Negative.   Eyes: Negative.   Respiratory: Negative.   Cardiovascular: Negative.   Gastrointestinal: Positive for heartburn.  Genitourinary: Negative.  Musculoskeletal: Positive for back pain and joint pain.  Skin: Negative.   Neurological: Negative.   Endo/Heme/Allergies: Positive for environmental allergies.  Psychiatric/Behavioral: Negative.     Objective:  Physical Exam  Constitutional: She is oriented to person, place, and time. She appears well-developed.  HENT:  Head: Normocephalic.  Eyes: Pupils are equal, round, and reactive to light.  Neck: Neck supple. No JVD present. No tracheal deviation present. No thyromegaly present.  Cardiovascular: Normal rate, regular rhythm and intact distal pulses.  Respiratory: Effort normal and breath sounds normal. No respiratory distress. She has no wheezes.  GI: Soft. There is no abdominal tenderness. There is no guarding.  Musculoskeletal:     Right hip: She exhibits decreased range of motion, decreased strength, tenderness and bony tenderness. She exhibits no swelling, no deformity and no laceration.  Lymphadenopathy:    She has no cervical adenopathy.  Neurological: She is alert and oriented to person, place, and time.  Skin: Skin is warm and dry.  Psychiatric: She has a normal mood and affect.       Labs:  Estimated body mass index is 30.63 kg/m as calculated from the following:   Height as of 01/02/18: 5' 7.75" (1.721 m).   Weight as of 01/02/18: 90.7 kg.  Imaging Review:  Plain radiographs demonstrate metal-on-metal THA of the right hip. There is evidence of fluid collection. The bone quality appears to be good for age and reported activity level.     Preoperative templating of the joint replacement has been completed, documented, and submitted to the Operating Room personnel in order to optimize intra-operative equipment management.   Assessment/Plan:  Right hip with failed previous arthroplasty.  The patient history, physical examination, clinical judgement of the provider and imaging studies are consistent with end failure of the right hip, previous total hip arthroplasty. Revision total hip arthroplasty is deemed medically necessary. The treatment options including medical management, injection therapy, arthroscopy and arthroplasty were discussed at length. The risks and benefits of total hip arthroplasty were presented and reviewed. The risks due to aseptic loosening, infection, stiffness, dislocation/subluxation,  thromboembolic complications and other imponderables were discussed.  The patient acknowledged the explanation, agreed to proceed with the plan and consent was signed. Patient is being admitted for inpatient treatment for surgery, pain control, PT, OT, prophylactic antibiotics, VTE prophylaxis, progressive ambulation and ADL's and discharge planning. The patient is planning to be discharged home.    [place] Drako Maese   PA-C  12/13/2018, 2:36 PM

## 2018-12-25 NOTE — Progress Notes (Signed)
MEDICAL CLEARANCE DR Theodis Sato 11-01-18 ON CHART EKG 04-25-18 DR VELAZQUEZ ON CHART LOV NOTE DR [id number] ON CHART CHEST XRAY 01-02-18 EPIC

## 2018-12-25 NOTE — Patient Instructions (Signed)
Abigail Huerta  12/25/2018   Your procedure is scheduled on: 01-02-19  Report to [facility] Main  Entrance  Report to admitting at 900 AM    Call this number if you have problems the morning of surgery [phone]   Remember: Do not eat food or drink liquids :After Midnight. BRUSH YOUR TEETH MORNING OF SURGERY AND RINSE YOUR MOUTH OUT, NO CHEWING GUM CANDY OR MINTS.  BRING CPAP MASK AND TUBING   Take these medicines the morning of surgery with A SIP OF WATER: FENTANYL PATCH, OXYCODONE IF NEEDED, ALBUTEROL INHALER IF NEEDED AND BRING INHALER, BUPROPION (WELLBUTRIN), ESCITALOPRAM (LEXAPRO)                               You may not have any metal on your body including hair pins and              piercings  Do not wear jewelry, make-up, lotions, powders or perfumes, deodorant             Do not wear nail polish.  Do not shave  48 hours prior to surgery.              Men may shave face and neck.   Do not bring valuables to the hospital. [place].  Contacts, dentures or bridgework may not be worn into surgery.  Leave suitcase in the car. After surgery it may be brought to your room.                   Please read over the following fact sheets you were given: _____________________________________________________________________             [facility] - Preparing for Surgery Before surgery, you can play an important role.  Because skin is not sterile, your skin needs to be as free of germs as possible.  You can reduce the number of germs on your skin by washing with CHG (chlorahexidine gluconate) soap before surgery.  CHG is an antiseptic cleaner which kills germs and bonds with the skin to continue killing germs even after washing. Please DO NOT use if you have an allergy to CHG or antibacterial soaps.  If your skin becomes reddened/irritated stop using the CHG and inform your nurse when you arrive at Short  Stay. Do not shave (including legs and underarms) for at least 48 hours prior to the first CHG shower.  You may shave your face/neck. Please follow these instructions carefully:  1.  Shower with CHG Soap the night before surgery and the  morning of Surgery.  2.  If you choose to wash your hair, wash your hair first as usual with your  normal  shampoo.  3.  After you shampoo, rinse your hair and body thoroughly to remove the  shampoo.                           4.  Use CHG as you would any other liquid soap.  You can apply chg directly  to the skin and wash                       Gently with a  scrungie or clean washcloth.  5.  Apply the CHG Soap to your body ONLY FROM THE NECK DOWN.   Do not use on face/ open                           Wound or open sores. Avoid contact with eyes, ears mouth and genitals (private parts).                       Wash face,  Genitals (private parts) with your normal soap.             6.  Wash thoroughly, paying special attention to the area where your surgery  will be performed.  7.  Thoroughly rinse your body with warm water from the neck down.  8.  DO NOT shower/wash with your normal soap after using and rinsing off  the CHG Soap.                9.  Pat yourself dry with a clean towel.            10.  Wear clean pajamas.            11.  Place clean sheets on your bed the night of your first shower and do not  sleep with pets. Day of Surgery : Do not apply any lotions/deodorants the morning of surgery.  Please wear clean clothes to the hospital/surgery center.  FAILURE TO FOLLOW THESE INSTRUCTIONS MAY RESULT IN THE CANCELLATION OF YOUR SURGERY PATIENT SIGNATURE_________________________________  NURSE SIGNATURE__________________________________  ________________________________________________________________________   Adam Phenix  An incentive spirometer is a tool that can help keep your lungs clear and active. This tool measures how well you are  filling your lungs with each breath. Taking long deep breaths may help reverse or decrease the chance of developing breathing (pulmonary) problems (especially infection) following:  A long period of time when you are unable to move or be active. BEFORE THE PROCEDURE   If the spirometer includes an indicator to show your best effort, your nurse or respiratory therapist will set it to a desired goal.  If possible, sit up straight or lean slightly forward. Try not to slouch.  Hold the incentive spirometer in an upright position. INSTRUCTIONS FOR USE  1. Sit on the edge of your bed if possible, or sit up as far as you can in bed or on a chair. 2. Hold the incentive spirometer in an upright position. 3. Breathe out normally. 4. Place the mouthpiece in your mouth and seal your lips tightly around it. 5. Breathe in slowly and as deeply as possible, raising the piston or the ball toward the top of the column. 6. Hold your breath for 3-5 seconds or for as long as possible. Allow the piston or ball to fall to the bottom of the column. 7. Remove the mouthpiece from your mouth and breathe out normally. 8. Rest for a few seconds and repeat Steps 1 through 7 at least 10 times every 1-2 hours when you are awake. Take your time and take a few normal breaths between deep breaths. 9. The spirometer may include an indicator to show your best effort. Use the indicator as a goal to work toward during each repetition. 10. After each set of 10 deep breaths, practice coughing to be sure your lungs are clear. If you have an incision (the cut made at the time of surgery), support your incision  when coughing by placing a pillow or rolled up towels firmly against it. Once you are able to get out of bed, walk around indoors and cough well. You may stop using the incentive spirometer when instructed by your caregiver.  RISKS AND COMPLICATIONS  Take your time so you do not get dizzy or light-headed.  If you are in pain,  you may need to take or ask for pain medication before doing incentive spirometry. It is harder to take a deep breath if you are having pain. AFTER USE  Rest and breathe slowly and easily.  It can be helpful to keep track of a log of your progress. Your caregiver can provide you with a simple table to help with this. If you are using the spirometer at home, follow these instructions: [place] IF:   You are having difficultly using the spirometer.  You have trouble using the spirometer as often as instructed.  Your pain medication is not giving enough relief while using the spirometer.  You develop fever of 100.5 F (38.1 C) or higher. SEEK IMMEDIATE MEDICAL CARE IF:   You cough up bloody sputum that had not been present before.  You develop fever of 102 F (38.9 C) or greater.  You develop worsening pain at or near the incision site. MAKE SURE YOU:   Understand these instructions.  Will watch your condition.  Will get help right away if you are not doing well or get worse. Document Released: 04/25/2007 Document Revised: 03/06/2012 Document Reviewed: 06/26/2007 Exit[employer] Patient Information 2014 [employer], [place].   ________________________________________________________________________  WHAT IS A BLOOD TRANSFUSION? Blood Transfusion Information  A transfusion is the replacement of blood or some of its parts. Blood is made up of multiple cells which provide different functions.  Red blood cells carry oxygen and are used for blood loss replacement.  White blood cells fight against infection.  Platelets control bleeding.  Plasma helps clot blood.  Other blood products are available for specialized needs, such as hemophilia or other clotting disorders. BEFORE THE TRANSFUSION  Who gives blood for transfusions?   Healthy volunteers who are fully evaluated to make sure their blood is safe. This is blood bank blood. Transfusion therapy is the safest it has ever  been in the practice of medicine. Before blood is taken from a donor, a complete history is taken to make sure that person has no history of diseases nor engages in risky social behavior (examples are intravenous drug use or sexual activity with multiple partners). The donor's travel history is screened to minimize risk of transmitting infections, such as malaria. The donated blood is tested for signs of infectious diseases, such as HIV and hepatitis. The blood is then tested to be sure it is compatible with you in order to minimize the chance of a transfusion reaction. If you or a relative donates blood, this is often done in anticipation of surgery and is not appropriate for emergency situations. It takes many days to process the donated blood. RISKS AND COMPLICATIONS Although transfusion therapy is very safe and saves many lives, the main dangers of transfusion include:   Getting an infectious disease.  Developing a transfusion reaction. This is an allergic reaction to something in the blood you were given. Every precaution is taken to prevent this. The decision to have a blood transfusion has been considered carefully by your caregiver before blood is given. Blood is not given unless the benefits outweigh the risks. AFTER THE TRANSFUSION  Right after  receiving a blood transfusion, you will usually feel much better and more energetic. This is especially true if your red blood cells have gotten low (anemic). The transfusion raises the level of the red blood cells which carry oxygen, and this usually causes an energy increase.  The nurse administering the transfusion will monitor you carefully for complications. HOME CARE INSTRUCTIONS  No special instructions are needed after a transfusion. You may find your energy is better. Speak with your caregiver about any limitations on activity for underlying diseases you may have. SEEK MEDICAL CARE IF:   Your condition is not improving after your  transfusion.  You develop redness or irritation at the intravenous (IV) site. SEEK IMMEDIATE MEDICAL CARE IF:  Any of the following symptoms occur over the next 12 hours:  Shaking chills.  You have a temperature by mouth above 102 F (38.9 C), not controlled by medicine.  Chest, back, or muscle pain.  People around you feel you are not acting correctly or are confused.  Shortness of breath or difficulty breathing.  Dizziness and fainting.  You get a rash or develop hives.  You have a decrease in urine output.  Your urine turns a dark color or changes to pink, red, or brown. Any of the following symptoms occur over the next 10 days:  You have a temperature by mouth above 102 F (38.9 C), not controlled by medicine.  Shortness of breath.  Weakness after normal activity.  The white part of the eye turns yellow (jaundice).  You have a decrease in the amount of urine or are urinating less often.  Your urine turns a dark color or changes to pink, red, or brown. Document Released: 12/10/2000 Document Revised: 03/06/2012 Document Reviewed: 07/29/2008 [facility] Patient Information 2014 [place], [place].  _______________________________________________________________________

## 2018-12-29 ENCOUNTER — Encounter (HOSPITAL_COMMUNITY)
Admission: RE | Admit: 2018-12-29 | Discharge: 2018-12-29 | Disposition: A | Discharge status: Home / Self Care | Payer: Medicare Other | Source: Ambulatory Visit | Attending: Orthopedic Surgery | Admitting: Orthopedic Surgery

## 2018-12-29 ENCOUNTER — Encounter (HOSPITAL_COMMUNITY): Payer: Self-pay

## 2018-12-29 ENCOUNTER — Other Ambulatory Visit: Payer: Self-pay

## 2018-12-29 DIAGNOSIS — Z01812 Encounter for preprocedural laboratory examination: Secondary | ICD-10-CM | POA: Insufficient documentation

## 2018-12-29 HISTORY — DX: Family history of other specified conditions: Z84.89

## 2018-12-29 HISTORY — DX: Personal history of other medical treatment: Z92.89

## 2018-12-29 LAB — CBC
HCT: 42.3 % (ref 36.0–46.0)
Hemoglobin: 13.4 g/dL (ref 12.0–15.0)
MCH: 27.7 pg (ref 26.0–34.0)
MCHC: 31.7 g/dL (ref 30.0–36.0)
MCV: 87.4 fL (ref 80.0–100.0)
Platelets: 208 10*3/uL (ref 150–400)
RBC: 4.84 MIL/uL (ref 3.87–5.11)
RDW: 12.6 % (ref 11.5–15.5)
WBC: 8.4 10*3/uL (ref 4.0–10.5)
nRBC: 0 % (ref 0.0–0.2)

## 2018-12-29 LAB — SURGICAL PCR SCREEN
MRSA, PCR: NEGATIVE
Staphylococcus aureus: POSITIVE — AB

## 2019-01-02 ENCOUNTER — Other Ambulatory Visit: Payer: Self-pay

## 2019-01-02 ENCOUNTER — Inpatient Hospital Stay (HOSPITAL_COMMUNITY): Payer: Medicare Other

## 2019-01-02 ENCOUNTER — Encounter (HOSPITAL_COMMUNITY): Payer: Self-pay | Admitting: *Deleted

## 2019-01-02 ENCOUNTER — Inpatient Hospital Stay (HOSPITAL_COMMUNITY): Payer: Medicare Other | Admitting: Certified Registered Nurse Anesthetist

## 2019-01-02 ENCOUNTER — Encounter (HOSPITAL_COMMUNITY)
Admission: RE | Disposition: A | Discharge status: Home / Self Care | Payer: Self-pay | Source: Home / Self Care | Attending: Orthopedic Surgery

## 2019-01-02 ENCOUNTER — Inpatient Hospital Stay (HOSPITAL_COMMUNITY): Payer: Medicare Other | Admitting: Physician Assistant

## 2019-01-02 ENCOUNTER — Inpatient Hospital Stay (HOSPITAL_COMMUNITY)
Admission: RE | Admit: 2019-01-02 | Discharge: 2019-01-03 | DRG: 468 | Disposition: A | Discharge status: Home / Self Care | Payer: Medicare Other | Attending: Orthopedic Surgery | Admitting: Orthopedic Surgery

## 2019-01-02 DIAGNOSIS — E66811 Obesity, class 1: Secondary | ICD-10-CM | POA: Diagnosis present

## 2019-01-02 DIAGNOSIS — Z981 Arthrodesis status: Secondary | ICD-10-CM

## 2019-01-02 DIAGNOSIS — Z96642 Presence of left artificial hip joint: Secondary | ICD-10-CM | POA: Diagnosis present

## 2019-01-02 DIAGNOSIS — J45909 Unspecified asthma, uncomplicated: Secondary | ICD-10-CM | POA: Diagnosis present

## 2019-01-02 DIAGNOSIS — Z87891 Personal history of nicotine dependence: Secondary | ICD-10-CM

## 2019-01-02 DIAGNOSIS — F988 Other specified behavioral and emotional disorders with onset usually occurring in childhood and adolescence: Secondary | ICD-10-CM | POA: Diagnosis present

## 2019-01-02 DIAGNOSIS — F329 Major depressive disorder, single episode, unspecified: Secondary | ICD-10-CM | POA: Diagnosis present

## 2019-01-02 DIAGNOSIS — K219 Gastro-esophageal reflux disease without esophagitis: Secondary | ICD-10-CM | POA: Diagnosis present

## 2019-01-02 DIAGNOSIS — M797 Fibromyalgia: Secondary | ICD-10-CM | POA: Diagnosis present

## 2019-01-02 DIAGNOSIS — E669 Obesity, unspecified: Secondary | ICD-10-CM | POA: Diagnosis present

## 2019-01-02 DIAGNOSIS — Z79899 Other long term (current) drug therapy: Secondary | ICD-10-CM | POA: Diagnosis not present

## 2019-01-02 DIAGNOSIS — G8929 Other chronic pain: Secondary | ICD-10-CM | POA: Diagnosis not present

## 2019-01-02 DIAGNOSIS — Z96651 Presence of right artificial knee joint: Secondary | ICD-10-CM

## 2019-01-02 DIAGNOSIS — Z7982 Long term (current) use of aspirin: Secondary | ICD-10-CM | POA: Diagnosis not present

## 2019-01-02 DIAGNOSIS — G4733 Obstructive sleep apnea (adult) (pediatric): Secondary | ICD-10-CM | POA: Diagnosis not present

## 2019-01-02 DIAGNOSIS — E663 Overweight: Secondary | ICD-10-CM | POA: Diagnosis present

## 2019-01-02 DIAGNOSIS — Z9989 Dependence on other enabling machines and devices: Secondary | ICD-10-CM

## 2019-01-02 DIAGNOSIS — Y792 Prosthetic and other implants, materials and accessory orthopedic devices associated with adverse incidents: Secondary | ICD-10-CM | POA: Diagnosis present

## 2019-01-02 DIAGNOSIS — E785 Hyperlipidemia, unspecified: Secondary | ICD-10-CM | POA: Diagnosis present

## 2019-01-02 DIAGNOSIS — Z96649 Presence of unspecified artificial hip joint: Secondary | ICD-10-CM

## 2019-01-02 DIAGNOSIS — Z7951 Long term (current) use of inhaled steroids: Secondary | ICD-10-CM

## 2019-01-02 DIAGNOSIS — Z6831 Body mass index (BMI) 31.0-31.9, adult: Secondary | ICD-10-CM

## 2019-01-02 DIAGNOSIS — T84090A Other mechanical complication of internal right hip prosthesis, initial encounter: Secondary | ICD-10-CM | POA: Diagnosis not present

## 2019-01-02 HISTORY — PX: TOTAL HIP REVISION: SHX763

## 2019-01-02 LAB — TYPE AND SCREEN
ABO/RH(D): O POS
Antibody Screen: NEGATIVE

## 2019-01-02 SURGERY — TOTAL HIP REVISION
Anesthesia: General | Site: Hip | Laterality: Right

## 2019-01-02 MED ORDER — FENTANYL 100 MCG/HR TD PT72
100.0000 ug | MEDICATED_PATCH | TRANSDERMAL | Status: DC
  Administered 2019-01-03: 100 ug via TRANSDERMAL
  Filled 2019-01-02: qty 1

## 2019-01-02 MED ORDER — METHOCARBAMOL 500 MG PO TABS
500.0000 mg | ORAL_TABLET | Freq: Four times a day (QID) | ORAL | Status: DC | PRN
  Administered 2019-01-02 – 2019-01-03 (×2): 500 mg via ORAL
  Filled 2019-01-02 (×2): qty 1

## 2019-01-02 MED ORDER — DOCUSATE SODIUM 100 MG PO CAPS: 100.0000 mg | ORAL_CAPSULE | Freq: Two times a day (BID) | ORAL | 0 refills | Status: DC

## 2019-01-02 MED ORDER — FENTANYL CITRATE (PF) 250 MCG/5ML IJ SOLN
INTRAMUSCULAR | Status: AC
  Filled 2019-01-02: qty 5

## 2019-01-02 MED ORDER — DEXAMETHASONE SODIUM PHOSPHATE 10 MG/ML IJ SOLN
10.0000 mg | Freq: Once | INTRAMUSCULAR | Status: AC
  Administered 2019-01-03: 10 mg via INTRAVENOUS
  Filled 2019-01-02: qty 1

## 2019-01-02 MED ORDER — POLYETHYLENE GLYCOL 3350 17 G PO PACK: 17.0000 g | PACK | Freq: Two times a day (BID) | ORAL | 0 refills | Status: DC

## 2019-01-02 MED ORDER — PROMETHAZINE HCL 25 MG/ML IJ SOLN: 6.2500 mg | INTRAMUSCULAR | Status: DC | PRN

## 2019-01-02 MED ORDER — LORAZEPAM 1 MG PO TABS
2.0000 mg | ORAL_TABLET | Freq: Every day | ORAL | Status: DC
  Administered 2019-01-02: 2 mg via ORAL
  Filled 2019-01-02: qty 2

## 2019-01-02 MED ORDER — METHOCARBAMOL 500 MG IVPB - SIMPLE MED
INTRAVENOUS | Status: AC
  Administered 2019-01-02: 500 mg via INTRAVENOUS
  Filled 2019-01-02: qty 50

## 2019-01-02 MED ORDER — METHOCARBAMOL 500 MG IVPB - SIMPLE MED
500.0000 mg | Freq: Four times a day (QID) | INTRAVENOUS | Status: DC | PRN
  Administered 2019-01-02: 500 mg via INTRAVENOUS
  Filled 2019-01-02: qty 50

## 2019-01-02 MED ORDER — MEPERIDINE HCL 50 MG/ML IJ SOLN: 6.2500 mg | INTRAMUSCULAR | Status: DC | PRN

## 2019-01-02 MED ORDER — SUGAMMADEX SODIUM 200 MG/2ML IV SOLN
INTRAVENOUS | Status: AC
  Filled 2019-01-02: qty 2

## 2019-01-02 MED ORDER — SUGAMMADEX SODIUM 200 MG/2ML IV SOLN
INTRAVENOUS | Status: DC | PRN
  Administered 2019-01-02: 200 mg via INTRAVENOUS

## 2019-01-02 MED ORDER — ASPIRIN 81 MG PO CHEW
81.0000 mg | CHEWABLE_TABLET | Freq: Two times a day (BID) | ORAL | Status: DC
  Administered 2019-01-02 – 2019-01-03 (×2): 81 mg via ORAL
  Filled 2019-01-02 (×2): qty 1

## 2019-01-02 MED ORDER — FERROUS SULFATE 325 (65 FE) MG PO TABS: 325.0000 mg | ORAL_TABLET | Freq: Three times a day (TID) | ORAL | 3 refills | Status: DC

## 2019-01-02 MED ORDER — CEFAZOLIN SODIUM-DEXTROSE 2-4 GM/100ML-% IV SOLN
2.0000 g | INTRAVENOUS | Status: AC
  Administered 2019-01-02: 2 g via INTRAVENOUS
  Filled 2019-01-02: qty 100

## 2019-01-02 MED ORDER — CEFAZOLIN SODIUM-DEXTROSE 2-4 GM/100ML-% IV SOLN
2.0000 g | Freq: Four times a day (QID) | INTRAVENOUS | Status: AC
  Administered 2019-01-02 (×2): 2 g via INTRAVENOUS
  Filled 2019-01-02 (×2): qty 100

## 2019-01-02 MED ORDER — ROCURONIUM BROMIDE 10 MG/ML (PF) SYRINGE
PREFILLED_SYRINGE | INTRAVENOUS | Status: AC
  Filled 2019-01-02: qty 10

## 2019-01-02 MED ORDER — ESCITALOPRAM OXALATE 20 MG PO TABS
20.0000 mg | ORAL_TABLET | Freq: Every day | ORAL | Status: DC
  Administered 2019-01-03: 20 mg via ORAL
  Filled 2019-01-02: qty 1

## 2019-01-02 MED ORDER — OXYCODONE HCL 5 MG PO TABS
20.0000 mg | ORAL_TABLET | ORAL | Status: DC | PRN
  Administered 2019-01-02 – 2019-01-03 (×5): 20 mg via ORAL
  Filled 2019-01-02 (×5): qty 4

## 2019-01-02 MED ORDER — FERROUS SULFATE 325 (65 FE) MG PO TABS
325.0000 mg | ORAL_TABLET | Freq: Three times a day (TID) | ORAL | Status: DC
  Administered 2019-01-03: 325 mg via ORAL
  Filled 2019-01-02: qty 1

## 2019-01-02 MED ORDER — MENTHOL 3 MG MT LOZG: 1.0000 | LOZENGE | OROMUCOSAL | Status: DC | PRN

## 2019-01-02 MED ORDER — DOCUSATE SODIUM 100 MG PO CAPS
100.0000 mg | ORAL_CAPSULE | Freq: Two times a day (BID) | ORAL | Status: DC
  Administered 2019-01-02 – 2019-01-03 (×2): 100 mg via ORAL
  Filled 2019-01-02 (×2): qty 1

## 2019-01-02 MED ORDER — OXYCODONE HCL 5 MG PO TABS: 10.0000 mg | ORAL_TABLET | ORAL | Status: DC | PRN

## 2019-01-02 MED ORDER — MIDAZOLAM HCL 5 MG/5ML IJ SOLN
INTRAMUSCULAR | Status: DC | PRN
  Administered 2019-01-02: 2 mg via INTRAVENOUS

## 2019-01-02 MED ORDER — ONDANSETRON HCL 4 MG PO TABS: 4.0000 mg | ORAL_TABLET | Freq: Four times a day (QID) | ORAL | Status: DC | PRN

## 2019-01-02 MED ORDER — CHLORHEXIDINE GLUCONATE 4 % EX LIQD: 60.0000 mL | Freq: Once | CUTANEOUS | Status: DC

## 2019-01-02 MED ORDER — HYDROMORPHONE HCL 1 MG/ML IJ SOLN
INTRAMUSCULAR | Status: DC | PRN
  Administered 2019-01-02 (×2): 1 mg via INTRAVENOUS

## 2019-01-02 MED ORDER — DEXAMETHASONE SODIUM PHOSPHATE 10 MG/ML IJ SOLN
10.0000 mg | Freq: Once | INTRAMUSCULAR | Status: AC
  Administered 2019-01-02: 10 mg via INTRAVENOUS

## 2019-01-02 MED ORDER — HYDROMORPHONE HCL 2 MG/ML IJ SOLN
INTRAMUSCULAR | Status: AC
  Filled 2019-01-02: qty 1

## 2019-01-02 MED ORDER — PROPOFOL 10 MG/ML IV BOLUS
INTRAVENOUS | Status: AC
  Filled 2019-01-02: qty 20

## 2019-01-02 MED ORDER — SODIUM CHLORIDE 0.9 % IR SOLN
Status: DC | PRN
  Administered 2019-01-02: 1000 mL

## 2019-01-02 MED ORDER — LIDOCAINE 2% (20 MG/ML) 5 ML SYRINGE
INTRAMUSCULAR | Status: DC | PRN
  Administered 2019-01-02: 80 mg via INTRAVENOUS

## 2019-01-02 MED ORDER — LACTATED RINGERS IV SOLN
INTRAVENOUS | Status: DC
  Administered 2019-01-02 (×2): via INTRAVENOUS

## 2019-01-02 MED ORDER — KETOROLAC TROMETHAMINE 30 MG/ML IJ SOLN: 30.0000 mg | Freq: Once | INTRAMUSCULAR | Status: DC | PRN

## 2019-01-02 MED ORDER — SODIUM CHLORIDE 0.9 % IV SOLN
INTRAVENOUS | Status: DC
  Administered 2019-01-02 (×2): via INTRAVENOUS

## 2019-01-02 MED ORDER — MAGNESIUM CITRATE PO SOLN: 1.0000 | Freq: Once | ORAL | Status: DC | PRN

## 2019-01-02 MED ORDER — BUPROPION HCL ER (XL) 300 MG PO TB24
300.0000 mg | ORAL_TABLET | Freq: Every day | ORAL | Status: DC
  Administered 2019-01-03: 300 mg via ORAL
  Filled 2019-01-02: qty 1

## 2019-01-02 MED ORDER — TRANEXAMIC ACID-NACL 1000-0.7 MG/100ML-% IV SOLN
1000.0000 mg | Freq: Once | INTRAVENOUS | Status: AC
  Administered 2019-01-02: 1000 mg via INTRAVENOUS
  Filled 2019-01-02: qty 100

## 2019-01-02 MED ORDER — ALBUTEROL SULFATE (2.5 MG/3ML) 0.083% IN NEBU: 2.5000 mg | INHALATION_SOLUTION | Freq: Four times a day (QID) | RESPIRATORY_TRACT | Status: DC | PRN

## 2019-01-02 MED ORDER — CELECOXIB 200 MG PO CAPS
200.0000 mg | ORAL_CAPSULE | Freq: Two times a day (BID) | ORAL | Status: DC
  Administered 2019-01-02 – 2019-01-03 (×2): 200 mg via ORAL
  Filled 2019-01-02 (×2): qty 1

## 2019-01-02 MED ORDER — METHYLPHENIDATE HCL 10 MG PO TABS
20.0000 mg | ORAL_TABLET | Freq: Two times a day (BID) | ORAL | Status: DC
  Administered 2019-01-02 – 2019-01-03 (×2): 20 mg via ORAL
  Filled 2019-01-02 (×2): qty 2

## 2019-01-02 MED ORDER — ASPIRIN 81 MG PO CHEW: 81.0000 mg | CHEWABLE_TABLET | Freq: Two times a day (BID) | ORAL | 0 refills | Status: DC

## 2019-01-02 MED ORDER — METOCLOPRAMIDE HCL 5 MG/ML IJ SOLN: 5.0000 mg | Freq: Three times a day (TID) | INTRAMUSCULAR | Status: DC | PRN

## 2019-01-02 MED ORDER — ALUM & MAG HYDROXIDE-SIMETH 200-200-20 MG/5ML PO SUSP: 15.0000 mL | ORAL | Status: DC | PRN

## 2019-01-02 MED ORDER — ONDANSETRON HCL 4 MG/2ML IJ SOLN: 4.0000 mg | Freq: Four times a day (QID) | INTRAMUSCULAR | Status: DC | PRN

## 2019-01-02 MED ORDER — BISACODYL 10 MG RE SUPP: 10.0000 mg | Freq: Every day | RECTAL | Status: DC | PRN

## 2019-01-02 MED ORDER — ONDANSETRON HCL 4 MG/2ML IJ SOLN
INTRAMUSCULAR | Status: AC
  Filled 2019-01-02: qty 2

## 2019-01-02 MED ORDER — DIPHENHYDRAMINE HCL 12.5 MG/5ML PO ELIX: 12.5000 mg | ORAL_SOLUTION | ORAL | Status: DC | PRN

## 2019-01-02 MED ORDER — ACETAMINOPHEN 500 MG PO TABS
1000.0000 mg | ORAL_TABLET | Freq: Four times a day (QID) | ORAL | Status: DC
  Administered 2019-01-02 – 2019-01-03 (×3): 1000 mg via ORAL
  Filled 2019-01-02 (×3): qty 2

## 2019-01-02 MED ORDER — ALBUTEROL SULFATE HFA 108 (90 BASE) MCG/ACT IN AERS: 2.0000 | INHALATION_SPRAY | Freq: Four times a day (QID) | RESPIRATORY_TRACT | Status: DC | PRN

## 2019-01-02 MED ORDER — PROPOFOL 10 MG/ML IV BOLUS
INTRAVENOUS | Status: DC | PRN
  Administered 2019-01-02: 170 mg via INTRAVENOUS

## 2019-01-02 MED ORDER — ONDANSETRON HCL 4 MG/2ML IJ SOLN
INTRAMUSCULAR | Status: DC | PRN
  Administered 2019-01-02: 4 mg via INTRAVENOUS

## 2019-01-02 MED ORDER — FENTANYL CITRATE (PF) 100 MCG/2ML IJ SOLN
INTRAMUSCULAR | Status: DC | PRN
  Administered 2019-01-02: 100 ug via INTRAVENOUS
  Administered 2019-01-02 (×3): 50 ug via INTRAVENOUS

## 2019-01-02 MED ORDER — PHENYLEPHRINE 40 MCG/ML (10ML) SYRINGE FOR IV PUSH (FOR BLOOD PRESSURE SUPPORT)
PREFILLED_SYRINGE | INTRAVENOUS | Status: DC | PRN
  Administered 2019-01-02 (×2): 40 ug via INTRAVENOUS

## 2019-01-02 MED ORDER — PHENOL 1.4 % MT LIQD: 1.0000 | OROMUCOSAL | Status: DC | PRN

## 2019-01-02 MED ORDER — TRANEXAMIC ACID-NACL 1000-0.7 MG/100ML-% IV SOLN
1000.0000 mg | INTRAVENOUS | Status: AC
  Administered 2019-01-02: 1000 mg via INTRAVENOUS
  Filled 2019-01-02: qty 100

## 2019-01-02 MED ORDER — HYDROMORPHONE HCL 1 MG/ML IJ SOLN
INTRAMUSCULAR | Status: AC
  Administered 2019-01-02: 0.5 mg via INTRAVENOUS
  Filled 2019-01-02: qty 1

## 2019-01-02 MED ORDER — LIDOCAINE 2% (20 MG/ML) 5 ML SYRINGE
INTRAMUSCULAR | Status: AC
  Filled 2019-01-02: qty 5

## 2019-01-02 MED ORDER — MIDAZOLAM HCL 2 MG/2ML IJ SOLN
INTRAMUSCULAR | Status: AC
  Filled 2019-01-02: qty 2

## 2019-01-02 MED ORDER — POLYETHYLENE GLYCOL 3350 17 G PO PACK
17.0000 g | PACK | Freq: Two times a day (BID) | ORAL | Status: DC
  Administered 2019-01-02 – 2019-01-03 (×2): 17 g via ORAL
  Filled 2019-01-02 (×2): qty 1

## 2019-01-02 MED ORDER — HYDROMORPHONE HCL 1 MG/ML IJ SOLN
0.5000 mg | INTRAMUSCULAR | Status: DC | PRN
  Administered 2019-01-02: 1 mg via INTRAVENOUS
  Filled 2019-01-02: qty 1

## 2019-01-02 MED ORDER — HYDROMORPHONE HCL 1 MG/ML IJ SOLN
0.2500 mg | INTRAMUSCULAR | Status: DC | PRN
  Administered 2019-01-02 (×4): 0.5 mg via INTRAVENOUS

## 2019-01-02 MED ORDER — METOCLOPRAMIDE HCL 5 MG PO TABS: 5.0000 mg | ORAL_TABLET | Freq: Three times a day (TID) | ORAL | Status: DC | PRN

## 2019-01-02 MED ORDER — ROCURONIUM BROMIDE 50 MG/5ML IV SOSY
PREFILLED_SYRINGE | INTRAVENOUS | Status: DC | PRN
  Administered 2019-01-02: 50 mg via INTRAVENOUS

## 2019-01-02 SURGICAL SUPPLY — 71 items
ADH SKN CLS APL DERMABOND .7 (GAUZE/BANDAGES/DRESSINGS) ×1
BAG DECANTER FOR FLEXI CONT (MISCELLANEOUS) ×1 IMPLANT
BAG SPEC THK2 15X12 ZIP CLS (MISCELLANEOUS) ×1
BAG ZIPLOCK 12X15 (MISCELLANEOUS) ×2 IMPLANT
BLADE SAW SGTL 11.0X1.19X90.0M (BLADE) IMPLANT
BLADE SAW SGTL 18X1.27X75 (BLADE) ×1 IMPLANT
BRUSH FEMORAL CANAL (MISCELLANEOUS) IMPLANT
COVER SURGICAL LIGHT HANDLE (MISCELLANEOUS) ×2 IMPLANT
COVER WAND RF STERILE (DRAPES) IMPLANT
DERMABOND ADVANCED (GAUZE/BANDAGES/DRESSINGS) ×1
DERMABOND ADVANCED .7 DNX12 (GAUZE/BANDAGES/DRESSINGS) ×1 IMPLANT
DRAPE INCISE IOBAN 66X45 STRL (DRAPES) ×1 IMPLANT
DRAPE INCISE IOBAN 85X60 (DRAPES) ×1 IMPLANT
DRAPE ORTHO SPLIT 77X108 STRL (DRAPES) ×4
DRAPE POUCH INSTRU U-SHP 10X18 (DRAPES) ×2 IMPLANT
DRAPE SURG 17X11 SM STRL (DRAPES) ×2 IMPLANT
DRAPE SURG ORHT 6 SPLT 77X108 (DRAPES) ×2 IMPLANT
DRAPE U-SHAPE 47X51 STRL (DRAPES) ×2 IMPLANT
DRESSING AQUACEL AG SP 3.5X10 (GAUZE/BANDAGES/DRESSINGS) IMPLANT
DRSG AQUACEL AG ADV 3.5X10 (GAUZE/BANDAGES/DRESSINGS) IMPLANT
DRSG AQUACEL AG ADV 3.5X14 (GAUZE/BANDAGES/DRESSINGS) ×1 IMPLANT
DRSG AQUACEL AG SP 3.5X10 (GAUZE/BANDAGES/DRESSINGS)
DURAPREP 26ML APPLICATOR (WOUND CARE) ×2 IMPLANT
ELECT BLADE TIP CTD 4 INCH (ELECTRODE) ×2 IMPLANT
ELECT REM PT RETURN 15FT ADLT (MISCELLANEOUS) ×2 IMPLANT
FACESHIELD WRAPAROUND (MASK) ×8 IMPLANT
FACESHIELD WRAPAROUND OR TEAM (MASK) ×4 IMPLANT
GAUZE SPONGE 2X2 8PLY STRL LF (GAUZE/BANDAGES/DRESSINGS) ×1 IMPLANT
GLOVE BIOGEL M 7.0 STRL (GLOVE) ×1 IMPLANT
GLOVE BIOGEL PI IND STRL 7.5 (GLOVE) ×1 IMPLANT
GLOVE BIOGEL PI IND STRL 8 (GLOVE) IMPLANT
GLOVE BIOGEL PI IND STRL 8.5 (GLOVE) ×1 IMPLANT
GLOVE BIOGEL PI INDICATOR 7.5 (GLOVE) ×4
GLOVE BIOGEL PI INDICATOR 8 (GLOVE) ×1
GLOVE BIOGEL PI INDICATOR 8.5 (GLOVE) ×1
GLOVE ECLIPSE 8.0 STRL XLNG CF (GLOVE) ×3 IMPLANT
GLOVE ORTHO TXT STRL SZ7.5 (GLOVE) ×2 IMPLANT
GOWN STRL REUS W/ TWL XL LVL3 (GOWN DISPOSABLE) IMPLANT
GOWN STRL REUS W/TWL 2XL LVL3 (GOWN DISPOSABLE) ×3 IMPLANT
GOWN STRL REUS W/TWL LRG LVL3 (GOWN DISPOSABLE) ×2 IMPLANT
GOWN STRL REUS W/TWL XL LVL3 (GOWN DISPOSABLE) ×2
HANDPIECE INTERPULSE COAX TIP (DISPOSABLE) ×2
HEAD FEM DLT TS CER 36X+5.0 (Head) ×1 IMPLANT
LINER NEUTRAL 52X36MM PLUS 4 (Liner) ×1 IMPLANT
MANIFOLD NEPTUNE II (INSTRUMENTS) ×2 IMPLANT
MARKER SKIN DUAL TIP RULER LAB (MISCELLANEOUS) ×2 IMPLANT
NDL SAFETY ECLIPSE 18X1.5 (NEEDLE) ×1 IMPLANT
NEEDLE HYPO 18GX1.5 SHARP (NEEDLE)
NS IRRIG 1000ML POUR BTL (IV SOLUTION) ×3 IMPLANT
PADDING CAST COTTON 6X4 STRL (CAST SUPPLIES) ×1 IMPLANT
PRESSURIZER FEMORAL UNIV (MISCELLANEOUS) IMPLANT
PROTECTOR NERVE ULNAR (MISCELLANEOUS) ×2 IMPLANT
SET HNDPC FAN SPRY TIP SCT (DISPOSABLE) IMPLANT
SPONGE GAUZE 2X2 STER 10/PKG (GAUZE/BANDAGES/DRESSINGS)
SPONGE LAP 18X18 RF (DISPOSABLE) ×1 IMPLANT
SPONGE LAP 4X18 RFD (DISPOSABLE) ×1 IMPLANT
STAPLER VISISTAT 35W (STAPLE) ×1 IMPLANT
SUCTION FRAZIER HANDLE 10FR (MISCELLANEOUS) ×1
SUCTION TUBE FRAZIER 10FR DISP (MISCELLANEOUS) ×1 IMPLANT
SUT MNCRL AB 3-0 PS2 18 (SUTURE) ×1 IMPLANT
SUT STRATAFIX PDS+ 0 24IN (SUTURE) ×2 IMPLANT
SUT VIC AB 1 CT1 36 (SUTURE) ×2 IMPLANT
SUT VIC AB 2-0 CT1 27 (SUTURE) ×6
SUT VIC AB 2-0 CT1 TAPERPNT 27 (SUTURE) ×3 IMPLANT
TOWEL OR 17X26 10 PK STRL BLUE (TOWEL DISPOSABLE) ×4 IMPLANT
TOWER CARTRIDGE SMART MIX (DISPOSABLE) IMPLANT
TRAY FOLEY CATH 14FRSI W/METER (CATHETERS) ×1 IMPLANT
TRAY FOLEY MTR SLVR 16FR STAT (SET/KITS/TRAYS/PACK) ×1 IMPLANT
TUBE KAMVAC SUCTION (TUBING) IMPLANT
WATER STERILE IRR 1000ML POUR (IV SOLUTION) ×3 IMPLANT
YANKAUER SUCT BULB TIP 10FT TU (MISCELLANEOUS) ×2 IMPLANT

## 2019-01-02 NOTE — Anesthesia Postprocedure Evaluation (Signed)
Anesthesia Post Note  Patient: Abigail Huerta  Procedure(s) Performed: TOTAL HIP REVISION (Right Hip)     Patient location during evaluation: PACU Anesthesia Type: General Level of consciousness: awake Pain management: pain level controlled Vital Signs Assessment: post-procedure vital signs reviewed and stable Respiratory status: spontaneous breathing Cardiovascular status: stable Postop Assessment: no apparent nausea or vomiting Anesthetic complications: no    Last Vitals:  Vitals:   01/02/19 1245 01/02/19 1307  BP: (!) 144/78 128/80  Pulse: 73 73  Resp: 14 16  Temp: 36.7 C 36.6 C  SpO2: 97% 92%    Last Pain:  Vitals:   01/02/19 1307  TempSrc:   PainSc: 0-No pain   Pain Goal:                 Huston Foley

## 2019-01-02 NOTE — Evaluation (Signed)
Physical Therapy Evaluation Patient Details Name: Abigail Huerta MRN: [id number] DOB: 07-09-1956 Today's Date: 01/02/2019   History of Present Illness  63 yo female s/p R THA revision via posterior approach on 01/02/19. PMH includes R THA 2005, chronic bronchitis, GERD, OA, OSA on CPAP, PLIF, L THA anterior approach, obesity, ADD, chronic pain, fibromyalgia, depression, HLD.  Clinical Impression   Pt presents with R hip pain, decreased knowledge of posterior hip precautions, decreased tolerance for mobility due to pain, and increased time and effort to perform mobility tasks. Pt to benefit from acute PT to address deficits. Pt ambulated 50 ft with RW with min guard assist, required sitting in recliner after 50 ft ambulation due to hip pain. PT discussed posterior hip precautions with pt before and after PT eval, pt demonstrating initial understanding of precautions. Pt educated on ankle pumps (20/hour) to perform this afternoon/evening to increase circulation, to pt's tolerance and limited by pain. PT to progress mobility as tolerated, and will continue to follow acutely.        Follow Up Recommendations Follow surgeon's recommendation for DC plan and follow-up therapies;Supervision for mobility/OOB(HEP )    Equipment Recommendations  None recommended by PT    Recommendations for Other Services       Precautions / Restrictions Precautions Precautions: Fall;Posterior Hip Precaution Booklet Issued: No Precaution Comments: Reviewed posterior hip precautions with pt - no bending hip past 90* or leaning torso over LE >90*, no adduction/no crossing legs, and no hip IR. Pt able to verbalize precautions at end of session Restrictions Weight Bearing Restrictions: No RLE Weight Bearing: Weight bearing as tolerated      Mobility  Bed Mobility Overal bed mobility: Needs Assistance Bed Mobility: Supine to Sit     Supine to sit: Supervision;HOB elevated     General bed mobility comments:  Supervision for safety. Verbal cues to reinforce posterior hip precautions. Pt with tendency to lean trunk forward during scooting to EOB and once resting at EOB.   Transfers Overall transfer level: Needs assistance Equipment used: Rolling walker (2 wheeled) Transfers: Sit to/from Stand Sit to Stand: Min guard         General transfer comment: Min guard for safety. Verbal cuing for hand placement and placement of RLE once standing to maintain precautions.   Ambulation/Gait Ambulation/Gait assistance: Min guard;+2 safety/equipment(chair follow) Gait Distance (Feet): 50 Feet Assistive device: Rolling walker (2 wheeled) Gait Pattern/deviations: Step-to pattern;Decreased weight shift to right;Antalgic Gait velocity: decr    General Gait Details: Min guard for safety. Verbal cuing provided throughout for maintaining posterior hip precautions, especially with changing directions with RW. Pt with increasingly antalgic gait with further ambulation distance.   Stairs            Wheelchair Mobility    Modified Rankin (Stroke Patients Only)       Balance Overall balance assessment: Mild deficits observed, not formally tested                                           Pertinent Vitals/Pain Pain Assessment: 0-10 Pain Score: 6  Pain Location: R hip  Pain Descriptors / Indicators: Sore;Aching Pain Intervention(s): Limited activity within patient's tolerance;Repositioned;Ice applied;Monitored during session;Premedicated before session;RN gave pain meds during session    [place] expects to be discharged to:: Private residence Living Arrangements: Spouse/significant other;Children Available Help at Discharge: Family;Available 24 hours/day  Type of Home: House Home Access: Ramped entrance     Home Layout: Two level;Bed/bath upstairs Home Equipment: [place] - 2 wheels;Bedside commode;Cane - single point;Tub bench      Prior Function Level of  Independence: Independent with assistive device(s)         Comments: Pt reports using cane every once in a while for mobility      Hand Dominance   Dominant Hand: Right    Extremity/Trunk Assessment   Upper Extremity Assessment Upper Extremity Assessment: Overall WFL for tasks assessed    Lower Extremity Assessment Lower Extremity Assessment: Overall WFL for tasks assessed;RLE deficits/detail RLE Deficits / Details: suspected post-surgical hip weakness RLE Sensation: WNL    Cervical / Trunk Assessment Cervical / Trunk Assessment: Normal  Communication   Communication: No difficulties  Cognition Arousal/Alertness: Awake/alert Behavior During Therapy: WFL for tasks assessed/performed Overall Cognitive Status: Within Functional Limits for tasks assessed                                        General Comments      Exercises     Assessment/Plan    PT Assessment Patient needs continued PT services  PT Problem List Decreased strength;Pain;Decreased activity tolerance;Decreased knowledge of use of DME;Decreased balance;Decreased safety awareness;Decreased mobility;Decreased knowledge of precautions       PT Treatment Interventions DME instruction;Therapeutic activities;Gait training;Therapeutic exercise;Patient/family education;Stair training;Balance training;Functional mobility training    PT Goals (Current goals can be found in the Care Plan section)  Acute Rehab PT Goals PT Goal Formulation: With patient Time For Goal Achievement: 01/09/19 Potential to Achieve Goals: Good    Frequency 7X/week   Barriers to discharge        Co-evaluation               AM-PAC PT "6 Clicks" Mobility  Outcome Measure Help needed turning from your back to your side while in a flat bed without using bedrails?: A Little Help needed moving from lying on your back to sitting on the side of a flat bed without using bedrails?: A Little Help needed moving to  and from a bed to a chair (including a wheelchair)?: A Little Help needed standing up from a chair using your arms (e.g., wheelchair or bedside chair)?: A Little Help needed to walk in hospital room?: A Little Help needed climbing 3-5 steps with a railing? : A Little 6 Click Score: 18    End of Session Equipment Utilized During Treatment: Gait belt Activity Tolerance: Patient tolerated treatment well;Patient limited by pain Patient left: in chair;with chair alarm set;with call bell/phone within reach;with family/visitor present;with SCD's reapplied Nurse Communication: Mobility status;Precautions;Patient requests pain meds PT Visit Diagnosis: Other abnormalities of gait and mobility (R26.89);Difficulty in walking, not elsewhere classified (R26.2)    Time: 3704-8889 PT Time Calculation (min) (ACUTE ONLY): 21 min   Charges:   PT Evaluation $PT Eval Low Complexity: 1 Low          Dvaughn Fickle Conception Chancy, PT Acute Rehabilitation Services Pager [phone]  Office [phone]   Roxine Caddy D Elonda Husky 01/02/2019, 6:37 PM

## 2019-01-02 NOTE — Interval H&P Note (Signed)
History and Physical Interval Note:  01/02/2019 7:07 AM  Abigail Huerta  has presented today for surgery, with the diagnosis of Failed Right total hip  The various methods of treatment have been discussed with the patient and family. After consideration of risks, benefits and other options for treatment, the patient has consented to  Procedure(s) with comments: TOTAL HIP REVISION (Right) - 90 mins as a surgical intervention .  The patient's history has been reviewed, patient examined, no change in status, stable for surgery.  I have reviewed the patient's chart and labs.  Questions were answered to the patient's satisfaction.     Mauri Pole

## 2019-01-02 NOTE — Transfer of Care (Signed)
Immediate Anesthesia Transfer of Care Note  Patient: Abigail Huerta  Procedure(s) Performed: TOTAL HIP REVISION (Right Hip)  Patient Location: PACU  Anesthesia Type:General  Level of Consciousness: drowsy and patient cooperative  Airway & Oxygen Therapy: Patient Spontanous Breathing and Patient connected to face mask oxygen  Post-op Assessment: Report given to RN and Post -op Vital signs reviewed and stable  Post vital signs: Reviewed and stable  Last Vitals:  Vitals Value Taken Time  BP 153/117 01/02/2019 10:46 AM  Temp    Pulse 106 01/02/2019 10:53 AM  Resp 17 01/02/2019 10:53 AM  SpO2 100 % 01/02/2019 10:53 AM  Vitals shown include unvalidated device data.  Last Pain:  Vitals:   01/02/19 0628  TempSrc: Oral         Complications: No apparent anesthesia complications

## 2019-01-02 NOTE — Anesthesia Procedure Notes (Signed)
Procedure Name: Intubation Date/Time: 01/02/2019 9:01 AM Performed by: Montel Clock, CRNA Pre-anesthesia Checklist: Patient identified, Emergency Drugs available, Suction available, Patient being monitored and Timeout performed Patient Re-evaluated:Patient Re-evaluated prior to induction Oxygen Delivery Method: Circle system utilized Preoxygenation: Pre-oxygenation with 100% oxygen Induction Type: IV induction Ventilation: Mask ventilation without difficulty and Oral airway inserted - appropriate to patient size Laryngoscope Size: Mac and 3 Grade View: Grade I Tube type: Oral Tube size: 7.0 mm Number of attempts: 1 Airway Equipment and Method: Stylet Placement Confirmation: ETT inserted through vocal cords under direct vision,  positive ETCO2 and breath sounds checked- equal and bilateral Secured at: 20 cm Tube secured with: Tape Dental Injury: Teeth and Oropharynx as per pre-operative assessment

## 2019-01-02 NOTE — Op Note (Signed)
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  FINDINGS:  The patient was noted to have corrosive wear to her trunnion and metal head ball with metal stained synovial lining of her joint.  No evidence of any muscle trauma or damage.  No evidence of any bone damage.  PROCEDURE:  Revision right hip with revision to a 36+4 neutral AltrX liner to match a 52 mm Pinnacle shell and a 36+5 delta ceramic ball with a titanium sleeve insert.  SURGEON:  Paralee Cancel, MD  ASSISTANT:  Griffith Citron PA-C  ANESTHESIA:  General.  SPECIMENS:  None.  COMPLICATIONS:  None.  ESTIMATED BLOOD LOSS:  Less than 100 mL.  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  She had done well and had undergone a contralateral total hip  arthroplasty with ceramic on polyethylene insert.  She had presented recently with some increasing right hip pain.  Clinical workup included an MRI of the right hip that indicated a fluid collection consistent with her clinical presentation.  Risks and  benefits of revision hip surgery in this setting were reviewed including the standard risk of infection and DVT.  Increased risk of dislocations were reviewed in the setting of acetabular liner and head exchange.  The consent was obtained for benefit of  pain relief and management of the metal issues.  DESCRIPTION OF PROCEDURE:  The patient was brought to the operative theater.  Once adequate anesthesia, preoperative antibiotics, Ancef administered as well as tranexamic  acid and Decadron, she was positioned into the left lateral decubitus position with  the right hip up.  The right lower extremity was then prepped and draped in sterile fashion.  A timeout was performed identifying the patient, the planned procedure and extremity.  The patient's old incision had been identified and marked on the skin.   The skin was excised.  Soft tissue dissection was carried down to the iliotibial band and gluteal fascia.  The fascia was then incised for posterior approach to the hip.  We then went through the fascia and we encountered first some clear bursal fluid  laterally.  As I went further posterior into the pseudocapsule of the artificial hip, we encountered some metal stained fluid.  The initial exposure included a bursectomy as well as a synovectomy or capsulectomy at this posterior aspect of the hip,  exposing the first two-thirds of the posterior aspect of the hip.  Soft tissue was elevated off the superior ilium.  Once the posterior aspect of the hip was adequately exposed, the hip was dislocated.  The prior metal head ball was disimpacted off the  trunnion.  At this point, we identified black staining to the inside of the femoral head as well as along the trunnion.  I did use a Bovie pad scratch pad in order to remove some of the corrosion of to the trunnion as well as the area at the junction of  the head and trunnion.  The trunnion was then placed up onto the ileum and retractors were placed anteriorly along the acetabulum and inferior.  Further exposure of the acetabular rim was  carried out.  With the acetabulum fully exposed, the metal liner  was disengaged from the metal shell with the provided device.  At this point, after removing some fibrous tissue within the acetabular shell, I used pulse lavage about 500 mL into the acetabulum and posterior aspect of the hip for debridement purposes.   Evaluation of the cup orientation indicated adequate forward flexion and  appropriate abduction.  I thus selected a 36+4 neutral AltrX liner, which was then impacted with good visualized rim fit.  At this point, I did a trial reduction with a 36+5 ball.   As I removed the 36+1.5 ball, I wanted to try to enhance stability with the offset of the liner and the ball.  A trial reduction was carried out and found that her combined anteversion was probably about 50 degrees.  There was no evidence of impingement  with extension, external rotation nor was there any problems with forward flexion, internal rotation or in the sleep position.  Given these findings, the hip was dislocated.  The trial ball was removed.  The hip was reirrigated with the remaining normal  saline solution, pulse lavage as the final 36+5 delta ceramic ball with a titanium sleeve insert was opened, it was then impacted on a clean and dried trunnion and the hip was re-reduced.  There was no capsular tissues posteriorly to reapproximate.   Thus, the iliotibial band and gluteal fascia were reapproximated using #1 Vicryl and a running barbed suture.  The remainder of the wound was closed with 2-0 Vicryl and a running Monocryl stitch.  The hip was then cleaned, dried and dressed sterilely  using surgical glue and Aquacel dressing.  She was brought to the recovery room in stable condition, tolerating the procedure well.  Findings were reviewed with family.  She will be admitted to the hospital, most likely overnight.  She will be  weightbearing as tolerated with posterior precautions reviewed due to the increased risk of dislocation.  TN/NUANCE  D:01/02/2019 T:01/02/2019 JOB:[id number]

## 2019-01-02 NOTE — Discharge Instructions (Signed)

## 2019-01-02 NOTE — Anesthesia Preprocedure Evaluation (Signed)
Anesthesia Evaluation  Patient identified by MRN, date of birth, ID band Patient awake    Reviewed: Allergy & Precautions, NPO status , Patient's Chart, lab work & pertinent test results  Airway Mallampati: II       Dental  (+) Edentulous Upper, Edentulous Lower   Pulmonary sleep apnea and Continuous Positive Airway Pressure Ventilation , former smoker,  11   Pulmonary exam normal breath sounds clear to auscultation       Cardiovascular negative cardio ROS Normal cardiovascular exam Rhythm:Regular Rate:Normal     Neuro/Psych  Headaches, PSYCHIATRIC DISORDERS Anxiety Depression    GI/Hepatic   Endo/Other    Renal/GU      Musculoskeletal  (+) Arthritis , Osteoarthritis,  Fibromyalgia -  Abdominal (+) + obese,   Peds  Hematology   Anesthesia Other Findings   Reproductive/Obstetrics                             Anesthesia Physical Anesthesia Plan  ASA: II  Anesthesia Plan: General   Post-op Pain Management:    Induction: Intravenous  PONV Risk Score and Plan: 3 and Ondansetron and Dexamethasone  Airway Management Planned: Oral ETT  Additional Equipment:   Intra-op Plan:   Post-operative Plan: Extubation in OR  Informed Consent: I have reviewed the patients History and Physical, chart, labs and discussed the procedure including the risks, benefits and alternatives for the proposed anesthesia with the patient or authorized representative who has indicated his/her understanding and acceptance.     Plan Discussed with: CRNA and Surgeon  Anesthesia Plan Comments:         Anesthesia Quick Evaluation

## 2019-01-02 NOTE — Brief Op Note (Signed)
01/02/2019  10:16 AM  PATIENT:  Abigail Huerta  63 y.o. 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  POST-OPERATIVE DIAGNOSIS:  Failed Right total hip due to metallosis  PROCEDURE:  Procedure(s) with comments: TOTAL HIP REVISION (Right) - 90 mins  SURGEON:  Surgeon(s) and Role:    Paralee Cancel, MD - Primary  PHYSICIAN ASSISTANT: Griffith Citron, PA-C  ASSISTANTS: surgical team  ANESTHESIA:   general  EBL:  100 mL   BLOOD ADMINISTERED:none  DRAINS: none   LOCAL MEDICATIONS USED:  NONE  SPECIMEN:  No Specimen  DISPOSITION OF SPECIMEN:  N/A  COUNTS:  YES  TOURNIQUET:  * No tourniquets in log *  DICTATION: .Other Dictation: Dictation Number [id number]  PLAN OF CARE: Admit to inpatient   PATIENT DISPOSITION:  PACU - hemodynamically stable.   Delay start of Pharmacological VTE agent (>24hrs) due to surgical blood loss or risk of bleeding: no

## 2019-01-03 ENCOUNTER — Encounter (HOSPITAL_COMMUNITY): Payer: Self-pay | Admitting: Orthopedic Surgery

## 2019-01-03 LAB — CBC
HCT: 29.4 % — ABNORMAL LOW (ref 36.0–46.0)
Hemoglobin: 9.6 g/dL — ABNORMAL LOW (ref 12.0–15.0)
MCH: 28.2 pg (ref 26.0–34.0)
MCHC: 32.7 g/dL (ref 30.0–36.0)
MCV: 86.2 fL (ref 80.0–100.0)
PLATELETS: 162 10*3/uL (ref 150–400)
RBC: 3.41 MIL/uL — ABNORMAL LOW (ref 3.87–5.11)
RDW: 12.6 % (ref 11.5–15.5)
WBC: 11.7 10*3/uL — ABNORMAL HIGH (ref 4.0–10.5)
nRBC: 0 % (ref 0.0–0.2)

## 2019-01-03 LAB — BASIC METABOLIC PANEL
Anion gap: 8 (ref 5–15)
BUN: 9 mg/dL (ref 8–23)
CALCIUM: 8.2 mg/dL — AB (ref 8.9–10.3)
CO2: 23 mmol/L (ref 22–32)
Chloride: 107 mmol/L (ref 98–111)
Creatinine, Ser: 0.95 mg/dL (ref 0.44–1.00)
GFR calc Af Amer: >60 mL/min (ref >60)
GFR calc non Af Amer: >60 mL/min (ref >60)
Glucose, Bld: 126 mg/dL — ABNORMAL HIGH (ref 70–99)
Potassium: 4.1 mmol/L (ref 3.5–5.1)
SODIUM: 138 mmol/L (ref 135–145)

## 2019-01-03 MED ORDER — ASPIRIN 81 MG PO CHEW: 81.0000 mg | CHEWABLE_TABLET | Freq: Two times a day (BID) | ORAL | 0 refills | Status: AC

## 2019-01-03 MED ORDER — OXYCODONE-ACETAMINOPHEN 10-325 MG PO TABS: 1.0000 | ORAL_TABLET | Freq: Four times a day (QID) | ORAL | 0 refills | Status: DC | PRN

## 2019-01-03 MED ORDER — TIZANIDINE HCL 2 MG PO TABS: 2.0000 mg | ORAL_TABLET | Freq: Four times a day (QID) | ORAL | 0 refills | Status: DC | PRN

## 2019-01-03 NOTE — Progress Notes (Signed)
     Subjective: 1 Day Post-Op Procedure(s) (LRB): TOTAL HIP REVISION (Right)   Patient reports pain as moderate, controlled with medications.  No events throughout the night. Ready to be discharged home if she does well with PT.    Objective:   VITALS:   Vitals:   01/03/19 0134 01/03/19 0529  BP: (!) 111/56 123/75  Pulse: 80 82  Resp: 16 16  Temp: 98.2 F (36.8 C) 97.7 F (36.5 C)  SpO2: 100% 100%    Dorsiflexion/Plantar flexion intact Incision: dressing C/D/I No cellulitis present Compartment soft  LABS Recent Labs    01/03/19 0523  HGB 9.6*  HCT 29.4*  WBC 11.7*  PLT 162    Recent Labs    01/03/19 0523  NA 138  K 4.1  BUN 9  CREATININE 0.95  GLUCOSE 126*     Assessment/Plan: 1 Day Post-Op Procedure(s) (LRB): TOTAL HIP REVISION (Right) Foley cath d/c'ed Advance diet Up with therapy D/C IV fluids Discharge home Follow up in 2 weeks at [facility] Follow up with OLIN,Sonam Wandel D in 2 weeks.  Contact information:  [facility]) [address] [place] [phone]       Obese (BMI 30-39.9) Estimated body mass index is 31.94 kg/m as calculated from the following:   Height as of this encounter: 5' 7.5" (1.715 m).   Weight as of this encounter: 93.9 kg. Patient also counseled that weight may inhibit the healing process Patient counseled that losing weight will help with future health issues     West Pugh. Tharon Kitch   PAC  01/03/2019, 9:38 AM

## 2019-01-03 NOTE — Progress Notes (Signed)
Physical Therapy Treatment Patient Details Name: Abigail Huerta MRN: [id number] DOB: 09/28/1956 Today's Date: 01/03/2019    History of Present Illness 63 yo female s/p R THA revision via posterior approach on 01/02/19. PMH includes R THA 2005, chronic bronchitis, GERD, OA, OSA on CPAP, PLIF, L THA anterior approach, obesity, ADD, chronic pain, fibromyalgia, depression, HLD.    PT Comments    Assisted OOB to amb in hallway, practiced multiple steps then return to bed.    Follow Up Recommendations  Follow surgeon's recommendation for DC plan and follow-up therapies;Supervision for mobility/OOB     Equipment Recommendations  None recommended by PT    Recommendations for Other Services       Precautions / Restrictions Precautions Precautions: Fall;Posterior Hip Precaution Comments: pt recalls 3/3 THP Restrictions Weight Bearing Restrictions: No RLE Weight Bearing: Weight bearing as tolerated    Mobility  Bed Mobility Overal bed mobility: Needs Assistance Bed Mobility: Supine to Sit;Sit to Supine     Supine to sit: Supervision;Min guard Sit to supine: Supervision;Min guard   General bed mobility comments: increased time   Transfers Overall transfer level: Needs assistance Equipment used: Rolling walker (2 wheeled) Transfers: Sit to/from Stand Sit to Stand: Min guard         General transfer comment: increased time  Ambulation/Gait Ambulation/Gait assistance: Supervision;Min guard Gait Distance (Feet): 55 Feet Assistive device: Rolling walker (2 wheeled) Gait Pattern/deviations: Step-to pattern;Decreased weight shift to right;Antalgic     General Gait Details: increased time   Stairs Stairs: Yes Stairs assistance: Min guard;Supervision Stair Management: Step to pattern;One rail Left Number of Stairs: 12 General stair comments: pt able to recall from prior surgeries proper tech/sequencing   Wheelchair Mobility    Modified Rankin (Stroke Patients Only)        Balance                                            Cognition Arousal/Alertness: Awake/alert Behavior During Therapy: WFL for tasks assessed/performed Overall Cognitive Status: Within Functional Limits for tasks assessed                                        Exercises      General Comments        Pertinent Vitals/Pain Pain Assessment: No/denies pain Pain Score: 4  Pain Location: R hip  Pain Descriptors / Indicators: Sore;Aching Pain Intervention(s): Monitored during session;Repositioned;Ice applied    Home Living                      Prior Function            PT Goals (current goals can now be found in the care plan section) Progress towards PT goals: Progressing toward goals    Frequency    7X/week      PT Plan Current plan remains appropriate    Co-evaluation              AM-PAC PT "6 Clicks" Mobility   Outcome Measure  Help needed turning from your back to your side while in a flat bed without using bedrails?: A Little Help needed moving from lying on your back to sitting on the side of a flat bed without using bedrails?: A Little Help needed  moving to and from a bed to a chair (including a wheelchair)?: A Little Help needed standing up from a chair using your arms (e.g., wheelchair or bedside chair)?: A Little Help needed to walk in hospital room?: A Little Help needed climbing 3-5 steps with a railing? : A Little 6 Click Score: 18    End of Session Equipment Utilized During Treatment: Gait belt Activity Tolerance: Patient tolerated treatment well;Patient limited by pain Patient left: in bed;with call bell/phone within reach Nurse Communication: Mobility status PT Visit Diagnosis: Other abnormalities of gait and mobility (R26.89);Difficulty in walking, not elsewhere classified (R26.2)     Time: 4818-5631 PT Time Calculation (min) (ACUTE ONLY): 26 min  Charges:  $Gait Training: 8-22  mins $Therapeutic Activity: 8-22 mins                     Rica Koyanagi  PTA Acute  Rehabilitation Services Pager      [phone] Office      [phone]

## 2019-01-04 NOTE — Discharge Summary (Signed)
Physician Discharge Summary  Patient ID: Abigail Huerta MRN: [id number] DOB/AGE: 09-15-1956 63 y.o.  Admit date: 01/02/2019 Discharge date: 01/03/2019   Procedures:  Procedure(s) (LRB): TOTAL HIP REVISION (Right)  Attending Physician:  Dr. Paralee Cancel   Admission Diagnoses:   Failed right THA - metalosis  Discharge Diagnoses:  Principal Problem:   S/P right TH revision Active Problems:   Obese  Past Medical History:  Diagnosis Date  . ADD (attention deficit disorder)   . Anxiety   . Arthritis   . Bronchitis    12-23-15 being seen today for bronchitis, ? whether she will have Colonoscopy scheduled 01-05-16  . Cardiac murmur   . Carpal tunnel syndrome, bilateral   . Chronic pain    "chronic pain-arthritis back and spine"  . Depression   . Eczema   . Family history of adverse reaction to anesthesia    sister slow to awaken  . Fibromyalgia   . Friction blisters of the palms    Uses Halobetasol  . History of blood transfusion 08/2004  . 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 Hyperlipemia   . Lyme disease   . Osteoarthritis   . Osteopenia   . Sleep apnea    cpap setting at 9    HPI:    [place], 63 y.o. 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 The indications for the revision total hip arthroplasty are bearing surface wear leading to  symptomatic synovitis. Onset of symptoms was gradual starting 2+ years ago with gradually worsening course since that time.  Prior procedures on the right hip include arthroplasty. Patient currently rates pain in the right hip at 8 out of 10 with activity.  There is night pain, worsening of pain with activity and weight bearing, trendelenberg gait, pain that interfers with activities  of daily living and pain with passive range of motion. Patient has evidence of metal-on-metal THA by imaging studies.  This condition presents safety issues increasing the risk of falls. There is no current active infection.  Risks, benefits and expectations were discussed with the patient.  Risks including but not limited to the risk of anesthesia, blood clots, nerve damage, blood vessel damage, failure of the prosthesis, infection and up to and including death.  Patient understand the risks, benefits and expectations and wishes to proceed with surgery.   PCP: Loraine Leriche., MD   Discharged Condition: good  Hospital Course:  Patient underwent the above stated procedure on 01/02/2019. Patient tolerated the procedure well and brought to the recovery room in good condition and subsequently to the floor.  POD #1 BP: 123/75 ; Pulse: 82 ; Temp: 97.7 F (36.5 C) ; Resp: 16 Patient reports pain as moderate, controlled with medications.  No events throughout the night. Ready to be discharged home. Dorsiflexion/plantar flexion intact, incision: dressing C/D/I, no cellulitis present and compartment soft.    LABS  Basename    HGB     9.6  HCT     29.4    Discharge Exam: General appearance: alert, cooperative and no distress Extremities: Homans sign is negative, no sign of DVT, no edema, redness or tenderness in the calves or thighs and no ulcers, gangrene or trophic changes  Disposition:  Home with follow up in  2 weeks   Follow-up Information    Paralee Cancel, MD. Schedule an appointment as soon as possible for a visit in 2 weeks.   Specialty:  Orthopedic Surgery Contact information: [address] [place] [address] [phone]           Discharge Instructions    Call MD / Call 911   Complete by:  As directed    If you experience chest pain or shortness of breath, CALL 911 and be transported to the hospital emergency room.  If you develope a fever  above 101 F, pus (white drainage) or increased drainage or redness at the wound, or calf pain, call your surgeon's office.   Change dressing   Complete by:  As directed    Maintain surgical dressing until follow up in the clinic. If the edges start to pull up, may reinforce with tape. If the dressing is no longer working, may remove and cover with gauze and tape, but must keep the area dry and clean.  Call with any questions or concerns.   Constipation Prevention   Complete by:  As directed    Drink plenty of fluids.  Prune juice may be helpful.  You may use a stool softener, such as Colace (over the counter) 100 mg twice a day.  Use MiraLax (over the counter) for constipation as needed.   Diet - low sodium heart healthy   Complete by:  As directed    Discharge instructions   Complete by:  As directed    Maintain surgical dressing until follow up in the clinic. If the edges start to pull up, may reinforce with tape. If the dressing is no longer working, may remove and cover with gauze and tape, but must keep the area dry and clean.  Follow up in 2 weeks at [facility]. Call with any questions or concerns.   Increase activity slowly as tolerated   Complete by:  As directed    Weight bearing as tolerated with assist device (walker, cane, etc) as directed, use it as long as suggested by your surgeon or therapist, typically at least 4-6 weeks.   TED hose   Complete by:  As directed    Use stockings (TED hose) for 2 weeks on both leg(s).  You may remove them at night for sleeping.      Allergies as of 01/03/2019   No Known Allergies     Medication List    STOP taking these medications   aspirin 81 MG tablet Replaced by:  aspirin 81 MG chewable tablet     TAKE these medications   albuterol (2.5 MG/3ML) 0.083% nebulizer solution Commonly known as:  PROVENTIL Take 2.5 mg by nebulization every 6 (six) hours as needed for wheezing or shortness of breath.   albuterol 108 (90 Base)  MCG/ACT inhaler Commonly known as:  PROVENTIL HFA;VENTOLIN HFA Inhale 2 puffs into the lungs every 6 (six) hours as needed for shortness of breath.   aspirin 81 MG chewable tablet Commonly known as:  ASPIRIN CHILDRENS Chew 1 tablet (81 mg total) by mouth 2 (two) times daily. Take for 4 weeks, then resume regular dose. Replaces:  aspirin 81 MG tablet   buPROPion 300 MG 24 hr tablet Commonly known as:  WELLBUTRIN XL Take 300 mg by mouth daily.   diphenhydrAMINE 25 mg capsule Commonly known as:  BENADRYL Take 25-50 mg by mouth daily as needed for allergies.   docusate sodium 100 MG capsule Commonly known  as:  COLACE Take 1 capsule (100 mg total) by mouth 2 (two) times daily.   escitalopram 20 MG tablet Commonly known as:  LEXAPRO Take 20 mg by mouth daily.   fentaNYL 100 MCG/HR Commonly known as:  DURAGESIC - dosed mcg/hr Place 100 mcg onto the skin every other day.   ferrous sulfate 325 (65 FE) MG tablet Commonly known as:  FERROUSUL Take 1 tablet (325 mg total) by mouth 3 (three) times daily with meals.   halobetasol 0.05 % ointment Commonly known as:  ULTRAVATE Apply 1 application topically daily as needed (irritation).   LORazepam 1 MG tablet Commonly known as:  ATIVAN Take 2 mg by mouth at bedtime.   methylphenidate 20 MG tablet Commonly known as:  RITALIN Take 20 mg by mouth 2 (two) times daily.   oxyCODONE-acetaminophen 10-325 MG tablet Commonly known as:  PERCOCET Take 1-2 tablets by mouth every 6 (six) hours as needed for pain. What changed:  how much to take   polyethylene glycol packet Commonly known as:  MIRALAX / GLYCOLAX Take 17 g by mouth 2 (two) times daily.   promethazine 25 MG tablet Commonly known as:  PHENERGAN Take 25 mg by mouth every 6 (six) hours as needed for nausea.   tiZANidine 2 MG tablet Commonly known as:  ZANAFLEX Take 1 tablet (2 mg total) by mouth every 6 (six) hours as needed for muscle spasms.   Vitamin B-12 1000 MCG  Subl Place 1,000 mcg under the tongue daily.            Discharge Care Instructions  (From admission, onward)         Start     Ordered   01/03/19 0000  Change dressing    Comments:  Maintain surgical dressing until follow up in the clinic. If the edges start to pull up, may reinforce with tape. If the dressing is no longer working, may remove and cover with gauze and tape, but must keep the area dry and clean.  Call with any questions or concerns.   01/03/19 8502           Signed: West Pugh. Berish Bohman   PA-C  01/04/2019, 11:55 AM

## 2019-11-16 DIAGNOSIS — N1831 Chronic kidney disease, stage 3a: Secondary | ICD-10-CM | POA: Insufficient documentation

## 2020-01-25 ENCOUNTER — Ambulatory Visit: Admit: 2020-01-25 | Payer: PRIVATE HEALTH INSURANCE | Admitting: Specialist

## 2020-01-25 SURGERY — ARTHROPLASTY, KNEE, TOTAL
Anesthesia: Spinal | Site: Knee | Laterality: Right

## 2020-02-05 ENCOUNTER — Emergency Department (HOSPITAL_COMMUNITY): Payer: Medicare Other

## 2020-02-05 ENCOUNTER — Encounter (HOSPITAL_COMMUNITY): Payer: Self-pay | Admitting: Emergency Medicine

## 2020-02-05 ENCOUNTER — Emergency Department (HOSPITAL_COMMUNITY)
Admission: EM | Admit: 2020-02-05 | Discharge: 2020-02-06 | Disposition: A | Discharge status: Home / Self Care | Payer: Medicare Other | Attending: Emergency Medicine | Admitting: Emergency Medicine

## 2020-02-05 ENCOUNTER — Other Ambulatory Visit: Payer: Self-pay

## 2020-02-05 DIAGNOSIS — R079 Chest pain, unspecified: Secondary | ICD-10-CM | POA: Insufficient documentation

## 2020-02-05 DIAGNOSIS — Z79899 Other long term (current) drug therapy: Secondary | ICD-10-CM | POA: Insufficient documentation

## 2020-02-05 DIAGNOSIS — Z87891 Personal history of nicotine dependence: Secondary | ICD-10-CM | POA: Diagnosis not present

## 2020-02-05 LAB — TROPONIN I (HIGH SENSITIVITY): Troponin I (High Sensitivity): 5 ng/L (ref <18)

## 2020-02-05 LAB — BASIC METABOLIC PANEL
Anion gap: 10 (ref 5–15)
BUN: 9 mg/dL (ref 8–23)
CO2: 27 mmol/L (ref 22–32)
Calcium: 9.7 mg/dL (ref 8.9–10.3)
Chloride: 103 mmol/L (ref 98–111)
Creatinine, Ser: 1.06 mg/dL — ABNORMAL HIGH (ref 0.44–1.00)
GFR calc Af Amer: >60 mL/min (ref >60)
GFR calc non Af Amer: 55 mL/min — ABNORMAL LOW (ref >60)
Glucose, Bld: 111 mg/dL — ABNORMAL HIGH (ref 70–99)
Potassium: 3.6 mmol/L (ref 3.5–5.1)
Sodium: 140 mmol/L (ref 135–145)

## 2020-02-05 LAB — CBC
HCT: 40.4 % (ref 36.0–46.0)
Hemoglobin: 13.2 g/dL (ref 12.0–15.0)
MCH: 26.9 pg (ref 26.0–34.0)
MCHC: 32.7 g/dL (ref 30.0–36.0)
MCV: 82.3 fL (ref 80.0–100.0)
Platelets: 261 10*3/uL (ref 150–400)
RBC: 4.91 MIL/uL (ref 3.87–5.11)
RDW: 12.5 % (ref 11.5–15.5)
WBC: 9 10*3/uL (ref 4.0–10.5)
nRBC: 0 % (ref 0.0–0.2)

## 2020-02-05 MED ORDER — NITROGLYCERIN 0.4 MG SL SUBL
0.4000 mg | SUBLINGUAL_TABLET | SUBLINGUAL | Status: DC | PRN
  Administered 2020-02-05 – 2020-02-06 (×2): 0.4 mg via SUBLINGUAL
  Filled 2020-02-05: qty 1

## 2020-02-05 MED ORDER — ASPIRIN 81 MG PO CHEW
243.0000 mg | CHEWABLE_TABLET | Freq: Once | ORAL | Status: AC
  Administered 2020-02-05: 243 mg via ORAL
  Filled 2020-02-05: qty 3

## 2020-02-05 MED ORDER — ALUM & MAG HYDROXIDE-SIMETH 200-200-20 MG/5ML PO SUSP
30.0000 mL | Freq: Once | ORAL | Status: AC
  Administered 2020-02-05: 30 mL via ORAL
  Filled 2020-02-05: qty 30

## 2020-02-05 MED ORDER — LIDOCAINE VISCOUS HCL 2 % MT SOLN
15.0000 mL | Freq: Once | OROMUCOSAL | Status: AC
  Administered 2020-02-05: 15 mL via ORAL
  Filled 2020-02-05: qty 15

## 2020-02-05 NOTE — ED Provider Notes (Signed)
[facility] DEPT Provider Note   CSN: [id number] Arrival date & time: 02/05/20  1812    History Chief Complaint  Patient presents with  . Chest Pain    Abigail Huerta is a 64 y.o. female.  The history is provided by the patient.  Chest Pain She has history of chronic pain, hyperlipidemia and comes in because of chest discomfort.  She states that about 4 PM, she developed a pressure feeling across her lower anterior chest which radiated to the back and up into her jaw and left arm.  Pain was rated at 8/10.  Nothing made it better, nothing made it worse.  There is no associated dyspnea, nausea, diaphoresis.  She states that she was doing spring cleaning when the pain started and this involved mild to moderate exertion.  She has had similar pains in the past, but they usually did not last this long.  She did take aspirin, Prevacid, and nitroglycerin with no relief although pain has gradually subsided and now is rated at 5/10.  She is a non-smoker but there is a strong family history of premature coronary atherosclerosis including a son who died of a heart attack.  She has never been evaluated by a cardiologist.  Past Medical History:  Diagnosis Date  . ADD (attention deficit disorder)   . Anxiety   . Arthritis   . Bronchitis    12-23-15 being seen today for bronchitis, ? whether she will have Colonoscopy scheduled 01-05-16  . Cardiac murmur   . Carpal tunnel syndrome, bilateral   . Chronic pain    "chronic pain-arthritis back and spine"  . Depression   . Eczema   . Family history of adverse reaction to anesthesia    sister slow to awaken  . Fibromyalgia   . Friction blisters of the palms    Uses Halobetasol  . History of blood transfusion 08/2004  . 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 Hyperlipemia   . Lyme disease   . Osteoarthritis   . Osteopenia   . Sleep apnea    cpap setting at 9    Patient Active Problem List   Diagnosis Date Noted  .  S/P right TH revision 01/02/2019  . Opacity of lung on imaging study 06/03/2016  . Chronic bronchitis ([place]) 05/04/2016  . Allergic rhinitis 05/04/2016  . GERD (gastroesophageal reflux disease) 05/04/2016  . Arthritis 05/04/2016  . OSA on CPAP 05/04/2016  . S/P lumbar spinal fusion 12/26/2014  . 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 Expected blood loss anemia 04/18/2013  . Obese 04/18/2013    Past Surgical History:  Procedure Laterality Date  . ABDOMINAL HYSTERECTOMY     1985 partial  . BACK SURGERY     lower  . CARPAL TUNNEL RELEASE Right   . EYE SURGERY Left    Retinal surgery  . HIP SURGERY Right 2005   Hip replacement  . JOINT REPLACEMENT    . KNEE ARTHROSCOPY Right   . LAPAROSCOPIC GASTRIC BANDING  2005  . MAXIMUM ACCESS (MAS)POSTERIOR LUMBAR INTERBODY FUSION (PLIF) 1 LEVEL N/A 12/26/2014   Procedure: FOR MAXIMUM ACCESS (MAS) POSTERIOR LUMBAR INTERBODY FUSION (PLIF) 1 LEVEL;  Surgeon: Eustace Moore, MD;  Location: [facility];  Service: Neurosurgery;  Laterality: N/A;  FOR MAXIMUM ACCESS (MAS) POSTERIOR LUMBAR INTERBODY FUSION (PLIF) 1 LEVEL LUMBAR 4-5  . TENDON REPAIR Right    forearm  . TOTAL HIP ARTHROPLASTY Left 04/17/2013   Procedure: TOTAL  HIP ARTHROPLASTY ANTERIOR APPROACH;  Surgeon: Mauri Pole, MD;  Location: [facility];  Service: Orthopedics;  Laterality: Left;  . TOTAL HIP REVISION Right 01/02/2019   Procedure: TOTAL HIP REVISION;  Surgeon: Paralee Cancel, MD;  Location: [facility];  Service: Orthopedics;  Laterality: Right;  90 mins     OB History   No obstetric history on file.     Family History  Problem Relation Age of Onset  . Lung disease Son   . Heart disease Father     Social History   Tobacco Use  . Smoking status: Former Smoker    Packs/day: 1.50    Years: 22.00    Pack years: 33.00    Types: Cigarettes    Start date: 01/16/1972    Quit date: 12/27/1993    Years since quitting: 26.1  . Smokeless tobacco: Never Used  Substance Use Topics  . Alcohol  use: No    Alcohol/week: 0.0 standard drinks  . Drug use: No    Home Medications Prior to Admission medications   Medication Sig Start Date End Date Taking? Authorizing Provider  albuterol (PROVENTIL HFA;VENTOLIN HFA) 108 (90 BASE) MCG/ACT inhaler Inhale 2 puffs into the lungs every 6 (six) hours as needed for shortness of breath.    [provider]  albuterol (PROVENTIL) (2.5 MG/3ML) 0.083% nebulizer solution Take 2.5 mg by nebulization every 6 (six) hours as needed for wheezing or shortness of breath.    [provider]  buPROPion (WELLBUTRIN XL) 300 MG 24 hr tablet Take 300 mg by mouth daily.    [provider]  Cyanocobalamin (VITAMIN B-12) 1000 MCG SUBL Place 1,000 mcg under the tongue daily.    [provider]  diphenhydrAMINE (BENADRYL) 25 mg capsule Take 25-50 mg by mouth daily as needed for allergies.    [provider]  docusate sodium (COLACE) 100 MG capsule Take 1 capsule (100 mg total) by mouth 2 (two) times daily. 01/02/19   Danae Orleans, PA-C  escitalopram (LEXAPRO) 20 MG tablet Take 20 mg by mouth daily.  04/22/16   [provider]  fentaNYL (DURAGESIC - DOSED MCG/HR) 100 MCG/HR Place 100 mcg onto the skin every other day.     [provider]  ferrous sulfate (FERROUSUL) 325 (65 FE) MG tablet Take 1 tablet (325 mg total) by mouth 3 (three) times daily with meals. 01/02/19   Danae Orleans, PA-C  halobetasol (ULTRAVATE) 0.05 % ointment Apply 1 application topically daily as needed (irritation).  12/23/16   [provider]  LORazepam (ATIVAN) 1 MG tablet Take 2 mg by mouth at bedtime.     [provider]  methylphenidate (RITALIN) 20 MG tablet Take 20 mg by mouth 2 (two) times daily.  04/22/16   [provider]  oxyCODONE-acetaminophen (PERCOCET) 10-325 MG tablet Take 1-2 tablets by mouth every 6 (six) hours as needed for pain. 01/03/19   Danae Orleans, PA-C  polyethylene glycol (MIRALAX /  GLYCOLAX) packet Take 17 g by mouth 2 (two) times daily. 01/02/19   Danae Orleans, PA-C  promethazine (PHENERGAN) 25 MG tablet Take 25 mg by mouth every 6 (six) hours as needed for nausea.    [provider]  tiZANidine (ZANAFLEX) 2 MG tablet Take 1 tablet (2 mg total) by mouth every 6 (six) hours as needed for muscle spasms. 01/03/19   Danae Orleans, PA-C    Allergies    Patient has no known allergies.  Review of Systems   Review of Systems  Cardiovascular: Positive for chest pain.  All other systems reviewed and are negative.   Physical Exam Updated Vital Signs BP 127/89 (BP Location: Left Arm)   Pulse 92   Temp 98.7 F (37.1 C) (Oral)   Resp 19   SpO2 99%   Physical Exam Vitals and nursing note reviewed.   64 year old female, resting comfortably and in no acute distress. Vital signs are significant for mildly elevated respiratory rate. Oxygen saturation is 96%, which is normal. Head is normocephalic and atraumatic. PERRLA, EOMI. Oropharynx is clear. Neck is nontender and supple without adenopathy or JVD. Back is nontender and there is no CVA tenderness. Lungs are clear without rales, wheezes, or rhonchi. Chest is nontender. Heart has regular rate and rhythm without murmur. Abdomen is soft, flat, with mild epigastric tenderness.  There is no rebound or guarding.  There are no masses or hepatosplenomegaly and peristalsis is normoactive. Extremities have no cyanosis or edema, full range of motion is present. Skin is warm and dry without rash. Neurologic: Mental status is normal, cranial nerves are intact, there are no motor or sensory deficits.  ED Results / Procedures / Treatments   Labs (all labs ordered are listed, but only abnormal results are displayed) Labs Reviewed  BASIC METABOLIC PANEL - Abnormal; Notable for the following components:      Result Value   Glucose, Bld 111 (*)    Creatinine, Ser 1.06 (*)    GFR calc non Af Amer 55 (*)    All other  components within normal limits  CBC  TROPONIN I (HIGH SENSITIVITY)  TROPONIN I (HIGH SENSITIVITY)    EKG EKG Interpretation  Date/Time:  Tuesday February 05 2020 18:47:14 EST Ventricular Rate:  83 PR Interval:    QRS Duration: 116 QT Interval:  384 QTC Calculation: 452 R Axis:   -21 Text Interpretation: Sinus rhythm Nonspecific intraventricular conduction delay When compared with ECG of 01/02/2018, No significant change was found Reconfirmed by Delora Fuel ([id number]) on 02/05/2020 10:51:22 PM   Radiology DG Chest 2 View  Result Date: 02/05/2020 CLINICAL DATA:  Chest pain EXAM: CHEST - 2 VIEW COMPARISON:  01/02/2018 FINDINGS: The heart size and mediastinal contours are within normal limits. Both lungs are clear. The visualized skeletal structures are unremarkable. IMPRESSION: No active cardiopulmonary disease. Electronically Signed   By: Ulyses Jarred M.D.   On: 02/05/2020 19:22    Procedures Procedures   Medications Ordered in ED Medications  aspirin chewable tablet 243 mg (has no administration in time range)  nitroGLYCERIN (NITROSTAT) SL tablet 0.4 mg (has no administration in time range)  alum & mag hydroxide-simeth (MAALOX/MYLANTA) 200-200-20 MG/5ML suspension 30 mL (has no administration in time range)    And  lidocaine (XYLOCAINE) 2 % viscous mouth solution 15 mL (has no administration in time range)    ED Course  I have reviewed the triage vital signs and the nursing notes.  Pertinent labs & imaging results that were available during my care of the patient were reviewed by me and considered in my medical decision making (see chart for details).  MDM Rules/Calculators/A&P Chest discomfort of uncertain cause.  She does to risk factors for coronary artery disease.  ECG shows no acute ST or T changes and chest x-ray is unremarkable.  Initial troponin is normal.  Currently waiting for delta troponin.  In the meantime, she will be given a trial of a GI cocktail and also  nitroglycerin and will be given additional aspirin.  Heart score  is 3, which puts her at low risk for major adverse cardiac events in the next 6 weeks.  Old records are reviewed, and she does have a prior ED visit for atypical chest pain.  Repeat troponin is unchanged.  Pain completely resolved following nitroglycerin and GI cocktail, patient is not certain which 1 actually gave her relief.  She is felt to be safe for discharge.  However, with her strong family history of premature coronary atherosclerosis, she is referred to cardiology for outpatient evaluation.  She is advised to continue taking low-dose aspirin until she sees cardiology.  Return precautions discussed.  Final Clinical Impression(s) / ED Diagnoses Final diagnoses:  Nonspecific chest pain    Rx / DC Orders ED Discharge Orders    None       Delora Fuel, MD [phone]

## 2020-02-05 NOTE — ED Triage Notes (Signed)
Patient complains of chest pain, pressure in her central chest that radiated to her mid back, states no relief with Asprin, Pepcid or nitro. States this has been going on for 1 year, but this episode has been going on for about 1 hour.

## 2020-02-06 DIAGNOSIS — R079 Chest pain, unspecified: Secondary | ICD-10-CM | POA: Diagnosis not present

## 2020-02-06 LAB — TROPONIN I (HIGH SENSITIVITY): Troponin I (High Sensitivity): 6 ng/L (ref <18)

## 2020-02-06 NOTE — Discharge Instructions (Addendum)
Take aspirin 81 mg every day until you see the cardiologist.  Return if you are having any problems.

## 2020-02-20 ENCOUNTER — Ambulatory Visit: Payer: Self-pay | Admitting: Neurology

## 2020-03-07 ENCOUNTER — Encounter: Payer: Self-pay | Admitting: Internal Medicine

## 2020-03-07 ENCOUNTER — Ambulatory Visit (INDEPENDENT_AMBULATORY_CARE_PROVIDER_SITE_OTHER): Payer: Medicare Other | Admitting: Internal Medicine

## 2020-03-07 ENCOUNTER — Other Ambulatory Visit: Payer: Self-pay

## 2020-03-07 VITALS — BP 126/80 | HR 89 | Ht 67.0 in | Wt 206.4 lb

## 2020-03-07 DIAGNOSIS — R079 Chest pain, unspecified: Secondary | ICD-10-CM

## 2020-03-07 DIAGNOSIS — Z0000 Encounter for general adult medical examination without abnormal findings: Secondary | ICD-10-CM | POA: Diagnosis not present

## 2020-03-07 DIAGNOSIS — I700 Atherosclerosis of aorta: Secondary | ICD-10-CM

## 2020-03-07 NOTE — Progress Notes (Signed)
Cardiology Office Note   Date:  03/07/2020   ID:  Abigail Huerta, DOB 1956-10-18, MRN [id number]  PCP:  Loraine Leriche., MD  Cardiologist:   Dorris Carnes, MD   Patient referred by ED for eval of CP      History of Present Illness: Abigail Huerta is a 64 y.o. female who was referred from ED for evaluation of CP   The pt was seen on 02/05/20 with CP   Pain began about 4 PM  Pressure in chest   Radiated to back, arm and jaw   No exacerbating or alleviating factors   Was doing spring cleaning at time   Pt had taken ASA, prevacid and NTG with mild improvement Pt with FHx of CAD including son who died of MI    Troponins were negative   Since then she has had no further CP   Breathing has been OK   She admits to not being too active  Has back problems        Current Meds  Medication Sig  . albuterol (PROVENTIL HFA;VENTOLIN HFA) 108 (90 BASE) MCG/ACT inhaler Inhale 2 puffs into the lungs every 6 (six) hours as needed for shortness of breath.  . baclofen (LIORESAL) 10 MG tablet Take 10 mg by mouth 3 (three) times daily as needed for muscle spasms.   Marland Kitchen buPROPion (WELLBUTRIN XL) 150 MG 24 hr tablet Take 450 mg by mouth daily.  . Cyanocobalamin (VITAMIN B-12) 1000 MCG SUBL Place 1,000 mcg under the tongue daily.  . diphenhydrAMINE (BENADRYL) 25 mg capsule Take 25-50 mg by mouth daily as needed for allergies.  Marland Kitchen escitalopram (LEXAPRO) 20 MG tablet Take 20 mg by mouth daily.   . fentaNYL (DURAGESIC - DOSED MCG/HR) 100 MCG/HR Place 100 mcg onto the skin every other day.   . halobetasol (ULTRAVATE) 0.05 % ointment Apply 1 application topically daily as needed (irritation).   . LORazepam (ATIVAN) 1 MG tablet Take 2 mg by mouth at bedtime.   . methylphenidate (RITALIN) 20 MG tablet Take 20 mg by mouth 2 (two) times daily.   Marland Kitchen oxyCODONE-acetaminophen (PERCOCET) 10-325 MG tablet Take 1-2 tablets by mouth every 6 (six) hours as needed for pain.  . promethazine (PHENERGAN) 25 MG tablet Take 25 mg by  mouth every 6 (six) hours as needed for nausea.  Marland Kitchen senna (SENOKOT) 8.6 MG tablet Take 1 tablet by mouth daily.  . [DISCONTINUED] ferrous sulfate (FERROUSUL) 325 (65 FE) MG tablet Take 1 tablet (325 mg total) by mouth 3 (three) times daily with meals.     Allergies:   Patient has no known allergies.   Past Medical History:  Diagnosis Date  . ADD (attention deficit disorder)   . Anxiety   . Arthritis   . Bronchitis    12-23-15 being seen today for bronchitis, ? whether she will have Colonoscopy scheduled 01-05-16  . Cardiac murmur   . Carpal tunnel syndrome, bilateral   . Chronic pain    "chronic pain-arthritis back and spine"  . Depression   . Eczema   . Family history of adverse reaction to anesthesia    sister slow to awaken  . Fibromyalgia   . Friction blisters of the palms    Uses Halobetasol  . History of blood transfusion 08/2004  . 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 Hyperlipemia   . Lyme disease   . Osteoarthritis   . Osteopenia   . Sleep apnea  cpap setting at 9    Past Surgical History:  Procedure Laterality Date  . ABDOMINAL HYSTERECTOMY     1985 partial  . BACK SURGERY     lower  . CARPAL TUNNEL RELEASE Right   . EYE SURGERY Left    Retinal surgery  . HIP SURGERY Right 2005   Hip replacement  . JOINT REPLACEMENT    . KNEE ARTHROSCOPY Right   . LAPAROSCOPIC GASTRIC BANDING  2005  . MAXIMUM ACCESS (MAS)POSTERIOR LUMBAR INTERBODY FUSION (PLIF) 1 LEVEL N/A 12/26/2014   Procedure: FOR MAXIMUM ACCESS (MAS) POSTERIOR LUMBAR INTERBODY FUSION (PLIF) 1 LEVEL;  Surgeon: Eustace Moore, MD;  Location: [facility];  Service: Neurosurgery;  Laterality: N/A;  FOR MAXIMUM ACCESS (MAS) POSTERIOR LUMBAR INTERBODY FUSION (PLIF) 1 LEVEL LUMBAR 4-5  . TENDON REPAIR Right    forearm  . TOTAL HIP ARTHROPLASTY Left 04/17/2013   Procedure: TOTAL HIP ARTHROPLASTY ANTERIOR APPROACH;  Surgeon: Mauri Pole, MD;  Location: [facility];  Service: Orthopedics;  Laterality: Left;   . TOTAL HIP REVISION Right 01/02/2019   Procedure: TOTAL HIP REVISION;  Surgeon: Paralee Cancel, MD;  Location: [facility];  Service: Orthopedics;  Laterality: Right;  90 mins     Social History:  The patient  reports that she quit smoking about 26 years ago. Her smoking use included cigarettes. She started smoking about 48 years ago. She has a 33.00 pack-year smoking history. She has never used smokeless tobacco. She reports that she does not drink alcohol or use drugs.   Family History:  The patient's family history includes Heart disease in her father; Lung disease in her son.    ROS:  Please see the history of present illness. All other systems are reviewed and  Negative to the above problem except as noted.    PHYSICAL EXAM: VS:  BP 126/80   Pulse 89   Ht 5\' 7"  (1.702 m)   Wt 206 lb 6.4 oz (93.6 kg)   SpO2 97%   BMI 32.33 kg/m   GEN: Well nourished, well developed, in no acute distress  HEENT: normal  Neck: no JVD, carotid bruits Cardiac: RRR; no murmurs, rubs, or gallops,no edema  Respiratory:  clear to auscultation bilaterally, normal work of breathing GI: soft, nontender, nondistended, + BS  No hepatomegaly  MS: no deformity Moving all extremities   Skin: warm and dry, no rash Neuro:  Strength and sensation are intact Psych: euthymic mood, full affect   EKG:  EKG is ordered today.   Lipid Panel No results found for: CHOL, TRIG, HDL, CHOLHDL, VLDL, LDLCALC, LDLDIRECT    Wt Readings from Last 3 Encounters:  03/07/20 206 lb 6.4 oz (93.6 kg)  01/02/19 207 lb (93.9 kg)  12/29/18 207 lb (93.9 kg)      ASSESSMENT AND PLAN: 1  CP   Atypical   I do not think the spell represents angina   She has had no recurrence  Follow for now    Note chest CT done in 2017  Mildatherosclerosis of aorta noted I would recomm lipids to further stratfiy risk / control risks  2   Lipids   Set up for lipid panel  F/U in 1 year   Sooner for recurrence of pain/discomfort    Current  medicines are reviewed at length with the patient today.  The patient does not have concerns regarding medicines.  Signed, Dorris Carnes, MD  03/07/2020 1:49 PM    [facility]  [address], [place], [place]  [address] Phone: [phone]; Fax: [phone]    

## 2020-03-07 NOTE — Patient Instructions (Signed)
Medication Instructions:  No changes *If you need a refill on your cardiac medications before your next appointment, please call your pharmacy*   Lab Work: Today: lipid panel If you have labs (blood work) drawn today and your tests are completely normal, you will receive your results only by: Marland Kitchen MyChart Message (if you have MyChart) OR . A paper copy in the mail If you have any lab test that is abnormal or we need to change your treatment, we will call you to review the results.   Testing/Procedures: none   Follow-Up: At [facility], you and your health needs are our priority.  As part of our continuing mission to provide you with exceptional heart care, we have created designated Provider Care Teams.  These Care Teams include your primary Cardiologist (physician) and Advanced Practice Providers (APPs -  Physician Assistants and Nurse Practitioners) who all work together to provide you with the care you need, when you need it.   Your next appointment:   12 month(s)  The format for your next appointment:   In Person  Provider:   You may see Dr. Dorris Carnes or one of the following Advanced Practice Providers on your designated Care Team:    Richardson Dopp, PA-C  [place], [place]  Daune Perch, NP    Other Instructions

## 2020-03-08 LAB — LIPID PANEL
Chol/HDL Ratio: 3.4 ratio (ref 0.0–4.4)
Cholesterol, Total: 253 mg/dL — ABNORMAL HIGH (ref 100–199)
HDL: 74 mg/dL (ref >39)
LDL Chol Calc (NIH): 149 mg/dL — ABNORMAL HIGH (ref 0–99)
Triglycerides: 172 mg/dL — ABNORMAL HIGH (ref 0–149)
VLDL Cholesterol Cal: 30 mg/dL (ref 5–40)

## 2020-03-10 ENCOUNTER — Telehealth: Payer: Self-pay | Admitting: *Deleted

## 2020-03-10 DIAGNOSIS — E782 Mixed hyperlipidemia: Secondary | ICD-10-CM

## 2020-03-10 MED ORDER — ROSUVASTATIN CALCIUM 20 MG PO TABS: 20.0000 mg | ORAL_TABLET | Freq: Every day | ORAL | 3 refills | Status: DC

## 2020-03-10 NOTE — Telephone Encounter (Signed)
Reviewed results and recommendations with patient who is in agreement with this plan. She is having knee surgery in April and wants to wait until early June to repeat labs. Appointment has been scheduled.

## 2020-03-10 NOTE — Telephone Encounter (Signed)
-----   Message from Fay Records, MD sent at 03/08/2020  7:58 PM EST ----- LDL is 149    Needs to be lower  CT scan showed mild atherosclerosis of aorta   I would recomm 20 mg Crestor    HDL good   74   Trig high at 172   Watch carbs F/U lipids and AST in 8 wks

## 2020-03-11 ENCOUNTER — Ambulatory Visit: Payer: Medicare Other | Attending: Internal Medicine

## 2020-03-11 ENCOUNTER — Telehealth: Payer: Self-pay | Admitting: Internal Medicine

## 2020-03-11 DIAGNOSIS — Z23 Encounter for immunization: Secondary | ICD-10-CM

## 2020-03-11 NOTE — Progress Notes (Signed)
   Covid-19 Vaccination Clinic  Name:  Abigail Huerta    MRN: [id number] DOB: 01-Sep-1956  03/11/2020  Ms. Bhandari was observed post Covid-19 immunization for 15 minutes without incident. She was provided with Vaccine Information Sheet and instruction to access the V-Safe system.   Ms. Ahlquist was instructed to call 911 with any severe reactions post vaccine: Marland Kitchen Difficulty breathing  . Swelling of face and throat  . A fast heartbeat  . A bad rash all over body  . Dizziness and weakness   Immunizations Administered    Name Date Dose VIS Date Route   Pfizer COVID-19 Vaccine 03/11/2020  5:12 PM 0.3 mL 12/07/2019 Intramuscular   Manufacturer: [place]   Lot: [id number]   [place]: [id number]

## 2020-03-11 NOTE — Telephone Encounter (Signed)
Follow Up:    Pt said Dr Abigail Huerta put her on Crestor. She says she will not be able to take this, because it causes constipation and muscle pain. She wants to know if there is something else she can take or eat to replace the Crestor?

## 2020-03-11 NOTE — Telephone Encounter (Signed)
I spoke to the patient who was started on Crestor 20 mg on 3/15.  She was reading the side effects and they worried her.    I told her to try the medication for 1 week and see if she experiences any of these.  She verbalized understanding and will keep [place] updated.

## 2020-03-31 NOTE — H&P (Signed)
TOTAL KNEE ADMISSION H&P  Patient is being admitted for right total knee arthroplasty.  Subjective:  Chief Complaint:right knee pain.  HPI: Abigail Huerta, 64 y.o. female, has a history of pain and functional disability in the right knee due to arthritis and has failed non-surgical conservative treatments for greater than 12 weeks to includeNSAID's and/or analgesics, corticosteriod injections, viscosupplementation injections, flexibility and strengthening excercises, supervised PT with diminished ADL's post treatment and activity modification.  Onset of symptoms was gradual, starting >10 years ago with gradually worsening course since that time. The patient noted prior procedures on the knee to include  arthroscopy and menisectomy on the right knee(s).  Patient currently rates pain in the right knee(s) at 5 out of 10 with activity. Patient has night pain, worsening of pain with activity and weight bearing, pain that interferes with activities of daily living and pain with passive range of motion.  Patient has evidence of subchondral sclerosis, periarticular osteophytes and joint space narrowing by imaging studies. This patient has had no previous injury. There is no active infection.  Patient Active Problem List   Diagnosis Date Noted  . S/P right TH revision 01/02/2019  . Opacity of lung on imaging study 06/03/2016  . Chronic bronchitis ([place]) 05/04/2016  . Allergic rhinitis 05/04/2016  . GERD (gastroesophageal reflux disease) 05/04/2016  . Arthritis 05/04/2016  . OSA on CPAP 05/04/2016  . S/P lumbar spinal fusion 12/26/2014  . 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 Expected blood loss anemia 04/18/2013  . Obese 04/18/2013   Past Medical History:  Diagnosis Date  . ADD (attention deficit disorder)   . Anxiety   . Arthritis   . Bronchitis    12-23-15 being seen today for bronchitis, ? whether she will have Colonoscopy scheduled 01-05-16  . Cardiac murmur   . Carpal tunnel syndrome, bilateral   .  Chronic pain    "chronic pain-arthritis back and spine"  . Depression   . Eczema   . Family history of adverse reaction to anesthesia    sister slow to awaken  . Fibromyalgia   . Friction blisters of the palms    Uses Halobetasol  . History of blood transfusion 08/2004  . 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 Hyperlipemia   . Lyme disease   . Osteoarthritis   . Osteopenia   . Sleep apnea    cpap setting at 9    Past Surgical History:  Procedure Laterality Date  . ABDOMINAL HYSTERECTOMY     1985 partial  . BACK SURGERY     lower  . CARPAL TUNNEL RELEASE Right   . EYE SURGERY Left    Retinal surgery  . HIP SURGERY Right 2005   Hip replacement  . JOINT REPLACEMENT    . KNEE ARTHROSCOPY Right   . LAPAROSCOPIC GASTRIC BANDING  2005  . MAXIMUM ACCESS (MAS)POSTERIOR LUMBAR INTERBODY FUSION (PLIF) 1 LEVEL N/A 12/26/2014   Procedure: FOR MAXIMUM ACCESS (MAS) POSTERIOR LUMBAR INTERBODY FUSION (PLIF) 1 LEVEL;  Surgeon: Eustace Moore, MD;  Location: [facility];  Service: Neurosurgery;  Laterality: N/A;  FOR MAXIMUM ACCESS (MAS) POSTERIOR LUMBAR INTERBODY FUSION (PLIF) 1 LEVEL LUMBAR 4-5  . TENDON REPAIR Right    forearm  . TOTAL HIP ARTHROPLASTY Left 04/17/2013   Procedure: TOTAL HIP ARTHROPLASTY ANTERIOR APPROACH;  Surgeon: Mauri Pole, MD;  Location: [facility];  Service: Orthopedics;  Laterality: Left;  . TOTAL HIP REVISION Right 01/02/2019   Procedure: TOTAL HIP  REVISION;  Surgeon: Paralee Cancel, MD;  Location: [facility];  Service: Orthopedics;  Laterality: Right;  90 mins    No current facility-administered medications for this encounter.   Current Outpatient Medications  Medication Sig Dispense Refill Last Dose  . albuterol (PROVENTIL HFA;VENTOLIN HFA) 108 (90 BASE) MCG/ACT inhaler Inhale 2 puffs into the lungs every 6 (six) hours as needed for shortness of breath.     Marland Kitchen aspirin EC 81 MG tablet Take 81 mg by mouth daily.     . baclofen (LIORESAL) 10 MG tablet Take 10 mg by mouth  3 (three) times daily as needed for muscle spasms.      . benzonatate (TESSALON) 100 MG capsule Take 100 mg by mouth 3 (three) times daily as needed for cough.     Marland Kitchen buPROPion (WELLBUTRIN XL) 150 MG 24 hr tablet Take 150 mg by mouth daily. Total daily dose=450 mg     . buPROPion (WELLBUTRIN XL) 300 MG 24 hr tablet Take 300 mg by mouth daily. Total daily dose=450 mg     . diphenhydrAMINE (BENADRYL) 25 mg capsule Take 25-50 mg by mouth daily as needed for allergies.     Marland Kitchen escitalopram (LEXAPRO) 20 MG tablet Take 20 mg by mouth daily.   3   . fentaNYL (DURAGESIC - DOSED MCG/HR) 100 MCG/HR Place 100 mcg onto the skin every other day.      Marland Kitchen LORazepam (ATIVAN) 1 MG tablet Take 2 mg by mouth at bedtime.   0   . methylphenidate (RITALIN) 20 MG tablet Take 20 mg by mouth 2 (two) times daily.   0   . oxyCODONE-acetaminophen (PERCOCET) 10-325 MG tablet Take 1-2 tablets by mouth every 6 (six) hours as needed for pain. 60 tablet 0   . promethazine (PHENERGAN) 25 MG tablet Take 25 mg by mouth every 6 (six) hours as needed for nausea.     Marland Kitchen senna (SENOKOT) 8.6 MG tablet Take 2 tablets by mouth daily.      . rosuvastatin (CRESTOR) 20 MG tablet Take 1 tablet (20 mg total) by mouth daily. 90 tablet 3    No Known Allergies  Social History   Tobacco Use  . Smoking status: Former Smoker    Packs/day: 1.50    Years: 22.00    Pack years: 33.00    Types: Cigarettes    Start date: 01/16/1972    Quit date: 12/27/1993    Years since quitting: 26.2  . Smokeless tobacco: Never Used  Substance Use Topics  . Alcohol use: No    Alcohol/week: 0.0 standard drinks    Family History  Problem Relation Age of Onset  . Lung disease Son   . Heart disease Father      Review of Systems  Constitutional: Negative.   HENT: Negative.   Eyes: Negative.   Respiratory: Negative.   Cardiovascular: Negative.   Gastrointestinal: Negative.   Endocrine: Negative.   Genitourinary: Negative.   Musculoskeletal: Positive for  arthralgias, back pain and myalgias.  Skin: Negative.   Allergic/Immunologic: Negative.   Neurological: Negative.   Hematological: Negative.   Psychiatric/Behavioral: Negative.     Objective:  Physical Exam  Constitutional: She is oriented to person, place, and time. She appears well-developed and well-nourished. She appears distressed.  HENT:  Head: Normocephalic and atraumatic.  Eyes: Pupils are equal, round, and reactive to light.  Neck: No JVD present. No tracheal deviation present. No thyromegaly present.  Cardiovascular: Normal rate, regular rhythm, normal heart sounds and intact  distal pulses. Exam reveals no gallop and no friction rub.  No murmur heard. Respiratory: Effort normal and breath sounds normal. No stridor. No respiratory distress. She has no wheezes. She has no rales. She exhibits no tenderness.  Musculoskeletal:     Cervical back: Normal range of motion and neck supple.     Comments: Tenderness over medial and lateral joint line ROM 0-115 degrees 5/5 strength with resisted flexion and extension NVI in right lower extremity  Lymphadenopathy:    She has no cervical adenopathy.  Neurological: She is alert and oriented to person, place, and time.  Skin: Skin is warm and dry. No rash noted. She is not diaphoretic. No erythema. No pallor.  Psychiatric: She has a normal mood and affect. Her behavior is normal. Judgment and thought content normal.    Vital signs in last 24 hours: BP: ()/()  Arterial Line BP: ()/()   Labs:   Estimated body mass index is 32.33 kg/m as calculated from the following:   Height as of 03/07/20: 5\' 7"  (1.702 m).   Weight as of 03/07/20: 93.6 kg.   Imaging Review Plain radiographs demonstrate severe degenerative joint disease of the right knee(s). The overall alignment ismild valgus. The bone quality appears to be good for age and reported activity level.      Assessment/Plan:  End stage arthritis, right knee   The patient  history, physical examination, clinical judgment of the provider and imaging studies are consistent with end stage degenerative joint disease of the right knee(s) and total knee arthroplasty is deemed medically necessary. The treatment options including medical management, injection therapy arthroscopy and arthroplasty were discussed at length. The risks and benefits of total knee arthroplasty were presented and reviewed. The risks due to aseptic loosening, infection, stiffness, patella tracking problems, thromboembolic complications and other imponderables were discussed. The patient acknowledged the explanation, agreed to proceed with the plan and consent was signed. Patient is being admitted for inpatient treatment for surgery, pain control, PT, OT, prophylactic antibiotics, VTE prophylaxis, progressive ambulation and ADL's and discharge planning. The patient is planning to be discharged home with home health services Patient would like to go home and do home health therapy    Patient's anticipated LOS is less than 2 midnights, meeting these requirements: - Younger than 52 - Lives within 1 hour of care - Has a competent adult at home to recover with post-op recover - NO history of  - Chronic pain requiring opiods  - Diabetes  - Coronary Artery Disease  - Heart failure  - Heart attack  - Stroke  - DVT/VTE  - Cardiac arrhythmia  - Respiratory Failure/COPD  - Renal failure  - Anemia  - Advanced Liver disease

## 2020-04-01 ENCOUNTER — Ambulatory Visit: Payer: Medicare Other | Attending: Internal Medicine

## 2020-04-01 DIAGNOSIS — Z23 Encounter for immunization: Secondary | ICD-10-CM

## 2020-04-01 NOTE — Progress Notes (Signed)
   Covid-19 Vaccination Clinic  Name:  JANYA SURTI    MRN: [id number] DOB: June 20, 1956  04/01/2020  Ms. Spano was observed post Covid-19 immunization for 15 minutes without incident. She was provided with Vaccine Information Sheet and instruction to access the V-Safe system.   Ms. Levick was instructed to call 911 with any severe reactions post vaccine: Marland Kitchen Difficulty breathing  . Swelling of face and throat  . A fast heartbeat  . A bad rash all over body  . Dizziness and weakness   Immunizations Administered    Name Date Dose VIS Date Route   Pfizer COVID-19 Vaccine 04/01/2020 12:45 PM 0.3 mL 12/07/2019 Intramuscular   Manufacturer: [place]   Lot: [id number]   [place]: [id number]

## 2020-04-02 NOTE — Patient Instructions (Signed)
DUE TO COVID-19 ONLY ONE VISITOR IS ALLOWED TO COME WITH YOU AND STAY IN THE WAITING ROOM ONLY DURING PRE OP AND PROCEDURE DAY OF SURGERY. THE 2 VISITORS MAY VISIT WITH YOU AFTER SURGERY IN YOUR PRIVATE ROOM DURING VISITING HOURS ONLY!  YOU NEED TO HAVE A COVID 19 TEST ON_4/13/21______ @__2 :15_____, THIS TEST MUST BE DONE BEFORE SURGERY, COME  [place], [address] , [address].  ([place]) ONCE YOUR COVID TEST IS COMPLETED, PLEASE BEGIN THE QUARANTINE INSTRUCTIONS AS OUTLINED IN YOUR HANDOUT.                [place]     Your procedure is scheduled on: 04/11/20   Report to [facility] Main  Entrance   Report to short Stay at 5:30 AM     Call this number if you have problems the morning of surgery [phone]   . BRUSH YOUR TEETH MORNING OF SURGERY AND RINSE YOUR MOUTH OUT, NO CHEWING GUM CANDY OR MINTS.  Do not eat food After Midnight.   YOU MAY HAVE CLEAR LIQUIDS FROM MIDNIGHT UNTIL 4:30 AM.   At 4:30 AM Please finish the prescribed Pre-Surgery  drink  . Nothing by mouth after you finish the  drink !    Take these medicines the morning of surgery with A SIP OF WATER: Wellbutrin, Lexapro, Use your inhalers and bring them with you to the hospital                                 You may not have any metal on your body including hair pins and              piercings  Do not wear jewelry, make-up, lotions, powders or perfumes, deodorant             Do not wear nail polish on your fingernails.  Do not shave  48 hours prior to surgery.            Do not bring valuables to the hospital. [place].  Contacts, dentures or bridgework may not be worn into surgery.       Name and phone number of your driver:  Special Instructions: N/A              Please read over the following fact sheets you were given: _____________________________________________________________________             [facility] - Preparing for Surgery Before surgery, you can play an important role.   Because skin is not sterile, your skin needs to be as free of germs as possible.   You can reduce the number of germs on your skin by washing with CHG (chlorahexidine gluconate) soap before surgery .  CHG is an antiseptic cleaner which kills germs and bonds with the skin to continue killing germs even after washing. Please DO NOT use if you have an allergy to CHG or antibacterial soaps .  If your skin becomes reddened/irritated stop using the CHG and inform your nurse when you arrive at Short Stay. Do not shave (including legs and underarms) for at least 48 hours prior to the first CHG shower.    Please follow these instructions carefully:  1.  Shower with CHG Soap the night before surgery and the  morning of Surgery.  2.  If you choose to wash your hair, wash your hair first as usual with your  normal  shampoo.  3.  After you shampoo, rinse your hair and body thoroughly to remove the  shampoo.                                        4.  Use CHG as you would any other liquid soap.  You can apply chg directly  to the skin and wash                       Gently with a scrungie or clean washcloth.  5.  Apply the CHG Soap to your body ONLY FROM THE NECK DOWN.   Do not use on face/ open                           Wound or open sores. Avoid contact with eyes, ears mouth and genitals (private parts).                       Wash face,  Genitals (private parts) with your normal soap.             6.  Wash thoroughly, paying special attention to the area where your surgery  will be performed.  7.  Thoroughly rinse your body with warm water from the neck down.  8.  DO NOT shower/wash with your normal soap after using and rinsing off  the CHG Soap.             9.  Pat yourself dry with a clean towel.            10.  Wear clean pajamas.            11.  Place clean sheets on your bed the night of your first shower and do not  sleep  with pets. Day of Surgery : Do not apply any lotions/deodorants the morning of surgery.  Please wear clean clothes to the hospital/surgery center.  FAILURE TO FOLLOW THESE INSTRUCTIONS MAY RESULT IN THE CANCELLATION OF YOUR SURGERY PATIENT SIGNATURE_________________________________  NURSE SIGNATURE__________________________________  ________________________________________________________________________   Abigail Huerta  An incentive spirometer is a tool that can help keep your lungs clear and active. This tool measures how well you are filling your lungs with each breath. Taking long deep breaths may help reverse or decrease the chance of developing breathing (pulmonary) problems (especially infection) following:  A long period of time when you are unable to move or be active. BEFORE THE PROCEDURE   If the spirometer includes an indicator to show your best effort, your nurse or respiratory therapist will set it to a desired goal.  If possible, sit up straight or lean slightly forward. Try not to slouch.  Hold the incentive spirometer in an upright position. INSTRUCTIONS FOR USE  1. Sit on the edge of your bed if possible, or sit up as far as you can in bed or on a chair. 2. Hold the incentive spirometer in an upright position. 3. Breathe out normally. 4. Place the mouthpiece in your mouth and seal your lips tightly around it. 5. Breathe in slowly and as deeply as possible, raising the piston or the ball toward the top of the column. 6. Hold your  breath for 3-5 seconds or for as long as possible. Allow the piston or ball to fall to the bottom of the column. 7. Remove the mouthpiece from your mouth and breathe out normally. 8. Rest for a few seconds and repeat Steps 1 through 7 at least 10 times every 1-2 hours when you are awake. Take your time and take a few normal breaths between deep breaths. 9. The spirometer may include an indicator to show your best effort. Use the  indicator as a goal to work toward during each repetition. 10. After each set of 10 deep breaths, practice coughing to be sure your lungs are clear. If you have an incision (the cut made at the time of surgery), support your incision when coughing by placing a pillow or rolled up towels firmly against it. Once you are able to get out of bed, walk around indoors and cough well. You may stop using the incentive spirometer when instructed by your caregiver.  RISKS AND COMPLICATIONS  Take your time so you do not get dizzy or light-headed.  If you are in pain, you may need to take or ask for pain medication before doing incentive spirometry. It is harder to take a deep breath if you are having pain. AFTER USE  Rest and breathe slowly and easily.  It can be helpful to keep track of a log of your progress. Your caregiver can provide you with a simple table to help with this. If you are using the spirometer at home, follow these instructions: Abigail Huerta IF:   You are having difficultly using the spirometer.  You have trouble using the spirometer as often as instructed.  Your pain medication is not giving enough relief while using the spirometer.  You develop fever of 100.5 F (38.1 C) or higher. SEEK IMMEDIATE MEDICAL CARE IF:   You cough up bloody sputum that had not been present before.  You develop fever of 102 F (38.9 C) or greater.  You develop worsening pain at or near the incision site. MAKE SURE YOU:   Understand these instructions.  Will watch your condition.  Will get help right away if you are not doing well or get worse. Document Released: 04/25/2007 Document Revised: 03/06/2012 Document Reviewed: 06/26/2007 [facility] Patient Information 2014 [place], [place].   ________________________________________________________________________

## 2020-04-03 ENCOUNTER — Encounter (HOSPITAL_COMMUNITY)
Admission: RE | Admit: 2020-04-03 | Discharge: 2020-04-03 | Disposition: A | Discharge status: Home / Self Care | Payer: Medicare Other | Source: Ambulatory Visit | Attending: Specialist | Admitting: Specialist

## 2020-04-03 ENCOUNTER — Encounter (HOSPITAL_COMMUNITY): Payer: Self-pay

## 2020-04-03 NOTE — Progress Notes (Signed)
I spoke with the patient today for her PAT visit. She said that she called Dr. Harrietta Guardian office and told them that she doesn't want surgery at this time. She will try to loose weight first .

## 2020-04-04 ENCOUNTER — Encounter (HOSPITAL_COMMUNITY): Payer: Medicare Other

## 2020-04-08 ENCOUNTER — Other Ambulatory Visit (HOSPITAL_COMMUNITY): Payer: Medicare Other

## 2020-04-11 ENCOUNTER — Inpatient Hospital Stay: Admit: 2020-04-11 | Payer: PRIVATE HEALTH INSURANCE | Admitting: Specialist

## 2020-04-11 SURGERY — ARTHROPLASTY, KNEE, TOTAL
Anesthesia: Spinal | Site: Knee | Laterality: Right

## 2020-06-04 DIAGNOSIS — M47816 Spondylosis without myelopathy or radiculopathy, lumbar region: Secondary | ICD-10-CM | POA: Insufficient documentation

## 2020-06-05 ENCOUNTER — Other Ambulatory Visit: Payer: Medicare Other

## 2020-06-12 ENCOUNTER — Other Ambulatory Visit: Payer: Medicare Other

## 2021-01-03 ENCOUNTER — Ambulatory Visit: Payer: Medicare Other | Attending: Internal Medicine

## 2021-01-03 DIAGNOSIS — Z23 Encounter for immunization: Secondary | ICD-10-CM

## 2021-02-25 ENCOUNTER — Encounter: Payer: Self-pay | Admitting: Dermatology

## 2021-02-25 ENCOUNTER — Other Ambulatory Visit: Payer: Self-pay

## 2021-02-25 ENCOUNTER — Ambulatory Visit (INDEPENDENT_AMBULATORY_CARE_PROVIDER_SITE_OTHER): Payer: Medicare Other | Admitting: Dermatology

## 2021-02-25 DIAGNOSIS — D485 Neoplasm of uncertain behavior of skin: Secondary | ICD-10-CM | POA: Diagnosis not present

## 2021-02-25 DIAGNOSIS — Z1283 Encounter for screening for malignant neoplasm of skin: Secondary | ICD-10-CM | POA: Diagnosis not present

## 2021-02-25 DIAGNOSIS — L821 Other seborrheic keratosis: Secondary | ICD-10-CM

## 2021-02-25 NOTE — Patient Instructions (Signed)

## 2021-03-08 ENCOUNTER — Encounter: Payer: Self-pay | Admitting: Dermatology

## 2021-03-09 NOTE — Progress Notes (Signed)
   New Patient   Subjective  Azora Bonzo Keeler is a 65 y.o. female who presents for the following: Skin Problem (Right arm, left arm, left temple- dark spots- no itch no bleed).  Growths on arms plus left forehead plus wants general skin check. Location:  Duration:  Quality:  Associated Signs/Symptoms: Modifying Factors:  Severity:  Timing: Context:    The following portions of the chart were reviewed this encounter and updated as appropriate:  Tobacco  Allergies  Meds  Problems  Med Hx  Surg Hx  Fam Hx      Objective  Well appearing patient in no apparent distress; mood and affect are within normal limits. Objective  Chest - Medial [facility]): All sun exposed areas back examined; one lesion on left upper arm outer forehead is atypical and will be biopsied.  Objective  Left Temple: Left inner temple tan textured flat topped 6 mm papule.  Objective  Mid Forehead: Pink 6 mm crust, superficial SCCA versus irritated SK.         All sun exposed areas plus back examined.   Assessment & Plan  Screening exam for skin cancer Chest - Medial [facility])  Annual skin examination, self examination twice annually.  Continued ultraviolet protection.  Seborrheic keratosis Left Temple  Leave if stable.  Neoplasm of uncertain behavior of skin Mid Forehead  Skin / nail biopsy Type of biopsy: tangential   Informed consent: discussed and consent obtained   Timeout: patient name, date of birth, surgical site, and procedure verified   Anesthesia: the lesion was anesthetized in a standard fashion   Anesthetic:  1% lidocaine w/ epinephrine 1-100,000 local infiltration Instrument used: flexible razor blade   Hemostasis achieved with: aluminum chloride and electrodesiccation   Outcome: patient tolerated procedure well   Post-procedure details: wound care instructions given    Specimen 1 - Surgical pathology Differential Diagnosis: bcc scc  Check Margins: No  Skin cancer  screening performed today.

## 2021-04-19 ENCOUNTER — Emergency Department (HOSPITAL_COMMUNITY): Payer: Medicare Other

## 2021-04-19 ENCOUNTER — Encounter (HOSPITAL_COMMUNITY): Payer: Self-pay

## 2021-04-19 ENCOUNTER — Other Ambulatory Visit: Payer: Self-pay

## 2021-04-19 ENCOUNTER — Emergency Department (HOSPITAL_COMMUNITY)
Admission: EM | Admit: 2021-04-19 | Discharge: 2021-04-19 | Disposition: A | Discharge status: Home / Self Care | Payer: Medicare Other | Attending: Emergency Medicine | Admitting: Emergency Medicine

## 2021-04-19 DIAGNOSIS — T8141XA Infection following a procedure, superficial incisional surgical site, initial encounter: Secondary | ICD-10-CM | POA: Diagnosis present

## 2021-04-19 DIAGNOSIS — Z7982 Long term (current) use of aspirin: Secondary | ICD-10-CM | POA: Diagnosis not present

## 2021-04-19 DIAGNOSIS — Z96643 Presence of artificial hip joint, bilateral: Secondary | ICD-10-CM | POA: Insufficient documentation

## 2021-04-19 DIAGNOSIS — L539 Erythematous condition, unspecified: Secondary | ICD-10-CM | POA: Diagnosis not present

## 2021-04-19 DIAGNOSIS — X58XXXA Exposure to other specified factors, initial encounter: Secondary | ICD-10-CM | POA: Diagnosis not present

## 2021-04-19 DIAGNOSIS — Z87891 Personal history of nicotine dependence: Secondary | ICD-10-CM | POA: Insufficient documentation

## 2021-04-19 LAB — CBC WITH DIFFERENTIAL/PLATELET
Abs Immature Granulocytes: 0.08 10*3/uL — ABNORMAL HIGH (ref 0.00–0.07)
Basophils Absolute: 0 10*3/uL (ref 0.0–0.1)
Basophils Relative: 0 %
Eosinophils Absolute: 0.4 10*3/uL (ref 0.0–0.5)
Eosinophils Relative: 2 %
HCT: 37.4 % (ref 36.0–46.0)
Hemoglobin: 12.3 g/dL (ref 12.0–15.0)
Immature Granulocytes: 1 %
Lymphocytes Relative: 8 %
Lymphs Abs: 1.3 10*3/uL (ref 0.7–4.0)
MCH: 28.3 pg (ref 26.0–34.0)
MCHC: 32.9 g/dL (ref 30.0–36.0)
MCV: 86.2 fL (ref 80.0–100.0)
Monocytes Absolute: 1 10*3/uL (ref 0.1–1.0)
Monocytes Relative: 7 %
Neutro Abs: 13.1 10*3/uL — ABNORMAL HIGH (ref 1.7–7.7)
Neutrophils Relative %: 82 %
Platelets: 209 10*3/uL (ref 150–400)
RBC: 4.34 MIL/uL (ref 3.87–5.11)
RDW: 13.1 % (ref 11.5–15.5)
WBC: 16 10*3/uL — ABNORMAL HIGH (ref 4.0–10.5)
nRBC: 0 % (ref 0.0–0.2)

## 2021-04-19 LAB — COMPREHENSIVE METABOLIC PANEL
ALT: 14 U/L (ref 0–44)
AST: 14 U/L — ABNORMAL LOW (ref 15–41)
Albumin: 3.8 g/dL (ref 3.5–5.0)
Alkaline Phosphatase: 82 U/L (ref 38–126)
Anion gap: 10 (ref 5–15)
BUN: 12 mg/dL (ref 8–23)
CO2: 27 mmol/L (ref 22–32)
Calcium: 9.1 mg/dL (ref 8.9–10.3)
Chloride: 100 mmol/L (ref 98–111)
Creatinine, Ser: 0.86 mg/dL (ref 0.44–1.00)
GFR, Estimated: >60 mL/min (ref >60)
Glucose, Bld: 135 mg/dL — ABNORMAL HIGH (ref 70–99)
Potassium: 3.8 mmol/L (ref 3.5–5.1)
Sodium: 137 mmol/L (ref 135–145)
Total Bilirubin: 0.4 mg/dL (ref 0.3–1.2)
Total Protein: 7.3 g/dL (ref 6.5–8.1)

## 2021-04-19 LAB — LACTIC ACID, PLASMA: Lactic Acid, Venous: 0.9 mmol/L (ref 0.5–1.9)

## 2021-04-19 MED ORDER — DOXYCYCLINE HYCLATE 100 MG PO CAPS: 100.0000 mg | ORAL_CAPSULE | Freq: Two times a day (BID) | ORAL | 0 refills | Status: AC

## 2021-04-19 MED ORDER — LIDOCAINE-EPINEPHRINE (PF) 2 %-1:200000 IJ SOLN
20.0000 mL | Freq: Once | INTRAMUSCULAR | Status: AC
  Administered 2021-04-19: 20 mL
  Filled 2021-04-19: qty 20

## 2021-04-19 MED ORDER — IOHEXOL 300 MG/ML  SOLN
100.0000 mL | Freq: Once | INTRAMUSCULAR | Status: AC | PRN
  Administered 2021-04-19: 100 mL via INTRAVENOUS

## 2021-04-19 NOTE — ED Triage Notes (Signed)
Pt presents with c/o post-op complications. Pt reports that she had lap band removal surgery on 4/4. Pt reports she has had the lap band since 2004. Pt reports that 3 days ago, she started to note leakage around the site on her abdomen and is now noting heavier leakage and swelling around the area.

## 2021-04-19 NOTE — Discharge Instructions (Addendum)
Please take antibiotic as directed until finished.  Your wound will likely continue to drain throughout the next 24 to 48 hours.  It is very important that you follow-up with your surgeon.  Please return to the ER if you have any fevers, chills, worsening pain, or any other new or concerning symptoms.

## 2021-04-19 NOTE — ED Provider Notes (Signed)
[place] DEPT Provider Note   CSN: [id number] Arrival date & time: 04/19/21  0844     History Chief Complaint  Patient presents with  . Post-op Problem    Abigail Huerta is a 65 y.o. female.  HPI 65 year old female with a history of ADD, fibromyalgia, Lyme disease presents to the ER with complaints of postop complication.  Patient reports having a lap band since 2004, had removed on 4/4 by Dr. Toney Rakes with [facility]  Reports that about several days ago she started to notice some pain to the area where the incision was, with worsening drainage progressively throughout the next few days.  She also reports now that there is a hardened area surrounding the incision.  She denies any fevers or chills.  She called the on-call nurse today and was told to come to the ER.  He denies any nausea or vomiting.    Past Medical History:  Diagnosis Date  . ADD (attention deficit disorder)   . Anxiety   . Arthritis   . Bronchitis    12-23-15 being seen today for bronchitis, ? whether she will have Colonoscopy scheduled 01-05-16  . Cardiac murmur   . Carpal tunnel syndrome, bilateral   . Chronic pain    "chronic pain-arthritis back and spine"  . Depression   . Eczema   . Family history of adverse reaction to anesthesia    sister slow to awaken  . Fibromyalgia   . Friction blisters of the palms    Uses Halobetasol  . History of blood transfusion 08/2004  . 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 Hyperlipemia   . Lyme disease   . Osteoarthritis   . Osteopenia   . Sleep apnea    cpap setting at 9    Patient Active Problem List   Diagnosis Date Noted  . S/P right TH revision 01/02/2019  . Opacity of lung on imaging study 06/03/2016  . Chronic bronchitis ([place]) 05/04/2016  . Allergic rhinitis 05/04/2016  . GERD (gastroesophageal reflux disease) 05/04/2016  . Arthritis 05/04/2016  . OSA on CPAP 05/04/2016  . S/P lumbar spinal fusion 12/26/2014   . 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 Expected blood loss anemia 04/18/2013  . Obese 04/18/2013    Past Surgical History:  Procedure Laterality Date  . ABDOMINAL HYSTERECTOMY     1985 partial  . BACK SURGERY     lower  . CARPAL TUNNEL RELEASE Right   . EYE SURGERY Left    Retinal surgery  . HIP SURGERY Right 2005   Hip replacement  . JOINT REPLACEMENT    . KNEE ARTHROSCOPY Right   . LAPAROSCOPIC GASTRIC BANDING  2005  . MAXIMUM ACCESS (MAS)POSTERIOR LUMBAR INTERBODY FUSION (PLIF) 1 LEVEL N/A 12/26/2014   Procedure: FOR MAXIMUM ACCESS (MAS) POSTERIOR LUMBAR INTERBODY FUSION (PLIF) 1 LEVEL;  Surgeon: Eustace Moore, MD;  Location: [facility];  Service: Neurosurgery;  Laterality: N/A;  FOR MAXIMUM ACCESS (MAS) POSTERIOR LUMBAR INTERBODY FUSION (PLIF) 1 LEVEL LUMBAR 4-5  . TENDON REPAIR Right    forearm  . TOTAL HIP ARTHROPLASTY Left 04/17/2013   Procedure: TOTAL HIP ARTHROPLASTY ANTERIOR APPROACH;  Surgeon: Mauri Pole, MD;  Location: [facility];  Service: Orthopedics;  Laterality: Left;  . TOTAL HIP REVISION Right 01/02/2019   Procedure: TOTAL HIP REVISION;  Surgeon: Paralee Cancel, MD;  Location: [facility];  Service: Orthopedics;  Laterality: Right;  90 mins  OB History   No obstetric history on file.     Family History  Problem Relation Age of Onset  . Lung disease Son   . Heart disease Father     Social History   Tobacco Use  . Smoking status: Former Smoker    Packs/day: 1.50    Years: 22.00    Pack years: 33.00    Types: Cigarettes    Start date: 01/16/1972    Quit date: 12/27/1993    Years since quitting: 27.3  . Smokeless tobacco: Never Used  Vaping Use  . Vaping Use: Never used  Substance Use Topics  . Alcohol use: No    Alcohol/week: 0.0 standard drinks  . Drug use: No    Home Medications Prior to Admission medications   Medication Sig Start Date End Date Taking? Authorizing Provider  albuterol (PROVENTIL HFA;VENTOLIN HFA) 108 (90 BASE) MCG/ACT inhaler  Inhale 2 puffs into the lungs every 6 (six) hours as needed for shortness of breath.   Yes [provider]  aspirin EC 81 MG tablet Take 81 mg by mouth daily.   Yes [provider]  baclofen (LIORESAL) 10 MG tablet Take 10 mg by mouth 3 (three) times daily as needed for muscle spasms.  01/31/20  Yes [provider]  buPROPion (WELLBUTRIN XL) 150 MG 24 hr tablet Take 150 mg by mouth daily. Total daily dose=450 mg   Yes [provider]  buPROPion (WELLBUTRIN XL) 300 MG 24 hr tablet Take 300 mg by mouth daily. Total daily dose=450 mg   Yes [provider]  Cyanocobalamin (VITAMIN B 12 PO) Take 1 tablet by mouth daily.   Yes [provider]  diphenhydrAMINE (BENADRYL) 25 mg capsule Take 25-50 mg by mouth daily as needed for allergies.   Yes [provider]  escitalopram (LEXAPRO) 20 MG tablet Take 20 mg by mouth daily.  04/22/16  Yes [provider]  fentaNYL (DURAGESIC - DOSED MCG/HR) 100 MCG/HR Place 100 mcg onto the skin every other day.    Yes [provider]  LORazepam (ATIVAN) 1 MG tablet Take 2 mg by mouth at bedtime.    Yes [provider]  methylphenidate (RITALIN) 20 MG tablet Take 20 mg by mouth 2 (two) times daily.  04/22/16  Yes [provider]  oxyCODONE-acetaminophen (PERCOCET) 10-325 MG tablet Take 1-2 tablets by mouth every 6 (six) hours as needed for pain. 01/03/19  Yes Babish, Molli Hazard, PA-C  promethazine (PHENERGAN) 25 MG tablet Take 25 mg by mouth every 6 (six) hours as needed for nausea.   Yes [provider]  senna (SENOKOT) 8.6 MG tablet Take 2 tablets by mouth daily.    Yes [provider]  benzonatate (TESSALON) 100 MG capsule Take 100 mg by mouth 3 (three) times daily as needed for cough. Patient not taking: Reported on 04/19/2021    [provider]  ferrous sulfate (FERROUSUL) 325 (65 FE) MG tablet Take 1 tablet (325 mg total) by mouth 3 (three) times daily  with meals. 01/02/19 03/07/20  Lanney Gins, PA-C    Allergies    Patient has no known allergies.  Review of Systems   Review of Systems  Constitutional: Negative for chills and fever.  HENT: Negative for ear pain and sore throat.   Eyes: Negative for pain and visual disturbance.  Respiratory: Negative for cough and shortness of breath.   Cardiovascular: Negative for chest pain and palpitations.  Gastrointestinal: Negative for abdominal pain and vomiting.  Genitourinary: Negative for dysuria and hematuria.  Musculoskeletal: Negative for arthralgias and back pain.  Skin: Positive for color change and wound. Negative for rash.  Neurological: Negative for seizures and syncope.  All other systems reviewed and are negative.   Physical Exam Updated Vital Signs BP 132/77   Pulse (!) 104   Temp 99.4 F (37.4 C) (Oral)   Resp 19   SpO2 95%   Physical Exam Vitals and nursing note reviewed.  Constitutional:      General: She is not in acute distress.    Appearance: She is well-developed.  HENT:     Head: Normocephalic and atraumatic.  Eyes:     Conjunctiva/sclera: Conjunctivae normal.  Cardiovascular:     Rate and Rhythm: Normal rate and regular rhythm.     Heart sounds: No murmur heard.   Pulmonary:     Effort: Pulmonary effort is normal. No respiratory distress.     Breath sounds: Normal breath sounds.  Abdominal:     Palpations: Abdomen is soft.     Tenderness: There is no abdominal tenderness.  Musculoskeletal:     Cervical back: Neck supple.  Skin:    General: Skin is warm and dry.     Findings: Erythema present.     Comments: Incision with light pink/serosanguineous drainage, evidence of small area of fistula?to the left lower abdomen. Surrounding erythema and induration noted.  Neurological:     General: No focal deficit present.     Mental Status: She is alert and oriented to person, place, and time.  Psychiatric:        Mood and Affect: Mood normal.        ED Results / Procedures / Treatments   Labs (all labs ordered are listed, but only abnormal results are displayed) Labs Reviewed  CBC WITH DIFFERENTIAL/PLATELET - Abnormal; Notable for the following components:      Result Value   WBC 16.0 (*)    Neutro Abs 13.1 (*)    Abs Immature Granulocytes 0.08 (*)    All other components within normal limits  COMPREHENSIVE METABOLIC PANEL - Abnormal; Notable for the following components:   Glucose, Bld 135 (*)    AST 14 (*)    All other components within normal limits  AEROBIC CULTURE W GRAM STAIN (SUPERFICIAL SPECIMEN)  LACTIC ACID, PLASMA    EKG None  Radiology CT ABDOMEN PELVIS W CONTRAST  Result Date: 04/19/2021 CLINICAL DATA:  Wound dehiscence post lap band removal EXAM: CT ABDOMEN AND PELVIS WITH CONTRAST TECHNIQUE: Multidetector CT imaging of the abdomen and pelvis was performed using the standard protocol following bolus administration of intravenous contrast. CONTRAST:  158mL OMNIPAQUE IOHEXOL 300 MG/ML  SOLN COMPARISON:  CT chest 05/05/2016 FINDINGS: Lower chest: No pleural or pericardial effusion. Small hiatal hernia. Visualized lung bases clear. Hepatobiliary: No focal liver abnormality is seen. No gallstones, gallbladder wall thickening, or biliary dilatation. Pancreas: Unremarkable. No pancreatic ductal dilatation or surrounding inflammatory changes. Spleen: Normal in size without focal abnormality. Adrenals/Urinary Tract: Adrenal glands are unremarkable. Kidneys are normal, without renal calculi, focal lesion, or hydronephrosis. Bladder is unremarkable. Stomach/Bowel: Small hiatal hernia. Stomach is nondistended. Small bowel decompressed. Appendix not discretely identified. No pericecal inflammatory/edematous change. The colon is nondilated, unremarkable. Vascular/Lymphatic: Scattered aortoiliac calcified plaque without aneurysm or evident stenosis. Circumaortic left renal vein, an anatomic variant. No abdominal or pelvic  adenopathy. Reproductive: Status post hysterectomy. No adnexal masses. Other: No ascites.  No free air. Musculoskeletal: 8.9 x 4.6 X 5.1 cm  subcutaneous fluid collection in the left lateral anterior abdominal wall, extending to the dermis. Adjacent subcutaneous inflammatory/edematous changes. Bilateral hip arthroplasty hardware, resulting in moderate streak artifact degrading portions of the study. Changes of instrumented PLIF L4-5 with adjacent-level disease at L5-S1. No acute fracture or worrisome bone lesion. IMPRESSION: 1. 8.9 cm subcutaneous fluid collection in the left lateral anterior abdominal wall, extending to the dermis, with adjacent inflammatory changes, suggesting abscess. 2. Small hiatal hernia. Aortic Atherosclerosis (ICD10-I70.0). Electronically Signed   By: Lucrezia Europe M.D.   On: 04/19/2021 11:13    Procedures .Marland KitchenIncision and Drainage  Date/Time: 04/19/2021 12:31 PM Performed by: Garald Balding, PA-C Authorized by: Garald Balding, PA-C   Consent:    Consent obtained:  Verbal   Consent given by:  Patient   Risks discussed:  Bleeding, incomplete drainage, pain and damage to other organs   Alternatives discussed:  No treatment Universal protocol:    Procedure explained and questions answered to patient or proxy's satisfaction: yes     Relevant documents present and verified: yes     Test results available : yes     Imaging studies available: yes     Required blood products, implants, devices, and special equipment available: yes     Site/side marked: yes     Immediately prior to procedure, a time out was called: yes     Patient identity confirmed:  Verbally with patient Location:    Type:  Abscess Pre-procedure details:    Skin preparation:  Betadine Anesthesia:    Anesthesia method:  Local infiltration   Local anesthetic:  Lidocaine 1% WITH epi Procedure type:    Complexity:  Complex Procedure details:    Incision types:  Stab incision (Blunt dissection)   Incision  depth:  Subcutaneous   Wound management:  Probed and deloculated, irrigated with saline and extensive cleaning   Drainage:  Serosanguinous   Drainage amount:  Copious   Wound treatment:  Wound left open   Packing materials:  None Post-procedure details:    Procedure completion:  Tolerated well, no immediate complications     Medications Ordered in ED Medications  iohexol (OMNIPAQUE) 300 MG/ML solution 100 mL (100 mLs Intravenous Contrast Given 04/19/21 1027)  lidocaine-EPINEPHrine (XYLOCAINE W/EPI) 2 %-1:200000 (PF) injection 20 mL (20 mLs Infiltration Given 04/19/21 1156)    ED Course  I have reviewed the triage vital signs and the nursing notes.  Pertinent labs & imaging results that were available during my care of the patient were reviewed by me and considered in my medical decision making (see chart for details).  Clinical Course as of 04/19/21 1233  Sun Apr 19, 2554  [id number] 65 year old female presenting with postop infection to the incision after receiving a lap band removal.  On arrival, vitals are reassuring, afebrile, not tachycardic, tachypneic or hypoxic.  She is overall very well-appearing.  Physical exam consistent with infected incision, with slightly bloody/serosanguineous discharge.  Significant erythema and surrounding induration noted.  Culture collected.  No evidence of sepsis at this time.  Plan for basic labs, CT of the abdomen to ensure there are no deep abdomen complications.  Suspect she will need antibiotics and close follow-up with her surgeon. [MB]  1125 WBC(!): 16.0 CBC with a leukocytosis of 16, normal lactic acid.  CMP without any significant abnormalities.  We did collect a culture of the wound drainage.   [MB]  1126 CT ABDOMEN PELVIS W CONTRAST IMPRESSION: 1. 8.9 cm subcutaneous fluid collection in the left  lateral anterior abdominal wall, extending to the dermis, with adjacent inflammatory changes, suggesting abscess. 2. Small hiatal hernia.   [MB]   [place] line for [facility] surgery consultation given how large her abscesses is [MB]  1137 Spoke with Dr.Westcott with [facility].  He recommends drainage of the abscess, antibiotics, close follow-up in the office tomorrow. [MB]  1229 Patient's abscess was drained at bedside.  Produced a large amount of lightly blood-tinged fluid with some pus.  Attempted to bluntly dissect the already open area of the abscess, given this, unfortunately the abscess was not amenable to packing.  Culture pending.  Will start on doxycycline, patient instructed to follow-up with her surgeon tomorrow.  We discussed return precautions.  She voiced understanding and is agreeable. [MB]  1232 This was a shared visit with my supervising physician Dr. Francia Greaves who independently saw and evaluated the patient & provided guidance in evaluation/management/disposition ,in agreement with care  [MB]    Clinical Course User Index [MB] Lyndel Safe   MDM Rules/Calculators/A&P                           Final Clinical Impression(s) / ED Diagnoses Final diagnoses:  None    Rx / DC Orders ED Discharge Orders    None       Lyndel Safe 04/19/21 1233    Valarie Merino, MD 04/19/21 1455

## 2021-04-21 LAB — AEROBIC CULTURE W GRAM STAIN (SUPERFICIAL SPECIMEN)

## 2021-04-22 ENCOUNTER — Telehealth (HOSPITAL_BASED_OUTPATIENT_CLINIC_OR_DEPARTMENT_OTHER): Payer: Self-pay | Admitting: Emergency Medicine

## 2021-04-22 NOTE — Telephone Encounter (Signed)
Post ED Visit - Positive Culture Follow-up  Culture report reviewed by antimicrobial stewardship pharmacist: [place] Team []  Elenor Quinones, Pharm.D. []  Heide Guile, Pharm.D., BCPS AQ-ID []  Parks Neptune, Pharm.D., BCPS []  Alycia Rossetti, Pharm.D., BCPS []  [place], [place].D., BCPS, AAHIVP []  Legrand Como, Pharm.D., BCPS, AAHIVP []  Salome Arnt, PharmD, BCPS []  Johnnette Gourd, PharmD, BCPS []  Hughes Better, PharmD, BCPS []  Leeroy Cha, PharmD []  Laqueta Linden, PharmD, BCPS []  Albertina Parr, PharmD  [place] Team []  Leodis Sias, PharmD []  Lindell Spar, PharmD []  Royetta Asal, PharmD []  Graylin Shiver, Rph []  Rema Fendt) Glennon Mac, PharmD []  Arlyn Dunning, PharmD []  Netta Cedars, PharmD []  Dia Sitter, PharmD []  Leone Haven, PharmD []  Gretta Arab, PharmD []  Theodis Shove, PharmD []  Peggyann Juba, PharmD []  Reuel Boom, PharmD   Positive wound  culture Treated with doxycycline, organism sensitive to the same and no further patient follow-up is required at this time.  Hazle Nordmann 04/22/2021, 9:05 AM

## 2021-07-09 ENCOUNTER — Ambulatory Visit: Payer: No Typology Code available for payment source | Admitting: Nurse Practitioner

## 2021-07-29 ENCOUNTER — Ambulatory Visit: Payer: Medicare Other | Admitting: Nurse Practitioner

## 2021-08-24 ENCOUNTER — Encounter: Payer: Medicare Other | Admitting: Nurse Practitioner

## 2021-08-24 ENCOUNTER — Encounter: Payer: Self-pay | Admitting: Nurse Practitioner

## 2021-08-25 NOTE — Progress Notes (Signed)
This encounter was created in error - please disregard.

## 2021-11-03 ENCOUNTER — Ambulatory Visit (INDEPENDENT_AMBULATORY_CARE_PROVIDER_SITE_OTHER): Payer: Medicare Other | Admitting: Internal Medicine

## 2021-11-03 ENCOUNTER — Encounter: Payer: Self-pay | Admitting: Internal Medicine

## 2021-11-03 ENCOUNTER — Other Ambulatory Visit: Payer: Self-pay

## 2021-11-03 VITALS — BP 136/76 | HR 95 | Temp 98.4°F | Ht 67.0 in | Wt 229.4 lb

## 2021-11-03 DIAGNOSIS — Z23 Encounter for immunization: Secondary | ICD-10-CM | POA: Diagnosis not present

## 2021-11-03 DIAGNOSIS — R0602 Shortness of breath: Secondary | ICD-10-CM

## 2021-11-03 DIAGNOSIS — R053 Chronic cough: Secondary | ICD-10-CM

## 2021-11-03 MED ORDER — PANTOPRAZOLE SODIUM 40 MG PO TBEC: 40.0000 mg | DELAYED_RELEASE_TABLET | Freq: Two times a day (BID) | ORAL | 5 refills | Status: DC

## 2021-11-03 NOTE — Patient Instructions (Addendum)
Please schedule follow up scheduled with myself in 2 months.  If my schedule is not open yet, we will contact you with a reminder closer to that time.  Start taking pantoprazole 40 mg twice a day.  Before your next visit I would like you to have: Full set of PFTs - 1 hour    What is GERD? Gastroesophageal reflux disease (GERD) is gastroesophageal reflux diseasewhich occurs when the lower esophageal sphincter (LES) opens spontaneously, for varying periods of time, or does not close properly and stomach contents rise up into the esophagus. GER is also called acid reflux or acid regurgitation, because digestive juices--called acids--rise up with the food. The esophagus is the tube that carries food from the mouth to the stomach. The LES is a ring of muscle at the bottom of the esophagus that acts like a valve between the esophagus and stomach.  When acid reflux occurs, food or fluid can be tasted in the back of the mouth. When refluxed stomach acid touches the lining of the esophagus it may cause a burning sensation in the chest or throat called heartburn or acid indigestion. Occasional reflux is common. Persistent reflux that occurs more than twice a week is considered GERD, and it can eventually lead to more serious health problems. People of all ages can have GERD. Studies have shown that GERD may worsen or contribute to asthma, chronic cough, and pulmonary fibrosis.   What are the symptoms of GERD? The main symptom of GERD in adults is frequent heartburn, also called acid indigestion--burning-type pain in the lower part of the mid-chest, behind the breast bone, and in the mid-abdomen.  Not all reflux is acidic in nature, and many patients don't have heart burn at all. Sometimes it feels like a cough (either dry or with mucus), choking sensation, asthma, shortness of breath, waking up at night, frequent throat clearing, or trouble swallowing.    What causes GERD? The reason some people develop  GERD is still unclear. However, research shows that in people with GERD, the LES relaxes while the rest of the esophagus is working. Anatomical abnormalities such as a hiatal hernia may also contribute to GERD. A hiatal hernia occurs when the upper part of the stomach and the LES move above the diaphragm, the muscle wall that separates the stomach from the chest. Normally, the diaphragm helps the LES keep acid from rising up into the esophagus. When a hiatal hernia is present, acid reflux can occur more easily. A hiatal hernia can occur in people of any age and is most often a normal finding in otherwise healthy people over age 26. Most of the time, a hiatal hernia produces no symptoms.   Other factors that may contribute to GERD include - Obesity or recent weight gain - Pregnancy  - Smoking  - Diet - Certain medications  Common foods that can worsen reflux symptoms include: - carbonated beverages - artificial sweeteners - citrus fruits  - chocolate  - drinks with caffeine or alcohol  - fatty and fried foods  - garlic and onions  - mint flavorings  - spicy foods  - tomato-based foods, like spaghetti sauce, salsa, chili, and pizza   Lifestyle Changes If you smoke, stop.  Avoid foods and beverages that worsen symptoms (see above.) Lose weight if needed.  Eat small, frequent meals.  Wear loose-fitting clothes.  Avoid lying down for 3 hours after a meal.  Raise the head of your bed 6 to 8 inches by securing wood  blocks under the bedposts. Just using extra pillows will not help, but using a wedge-shaped pillow may be helpful.  Medications  H2 blockers, such as cimetidine (Tagamet HB), famotidine (Pepcid AC), nizatidine (Axid AR), and ranitidine (Zantac 75), decrease acid production. They are available in prescription strength and over-the-counter strength. These drugs provide short-term relief and are effective for about half of those who have GERD symptoms.  Proton pump inhibitors  include omeprazole (Prilosec, Zegerid), lansoprazole (Prevacid), pantoprazole (Protonix), rabeprazole (Aciphex), and esomeprazole (Nexium), which are available by prescription. Prilosec is also available in over-the-counter strength. Proton pump inhibitors are more effective than H2 blockers and can relieve symptoms and heal the esophageal lining in almost everyone who has GERD.  Because drugs work in different ways, combinations of medications may help control symptoms. People who get heartburn after eating may take both antacids and H2 blockers. The antacids work first to neutralize the acid in the stomach, and then the H2 blockers act on acid production. By the time the antacid stops working, the H2 blocker will have stopped acid production. Your health care provider is the best source of information about how to use medications for GERD.   Points to Remember 1. You can have GERD without having heartburn. Your symptoms could include a dry cough, asthma symptoms, or trouble swallowing.  2. Taking medications daily as prescribed is important in controlling you symptoms.  Sometimes it can take up to 8 weeks to fully achieve the effects of the medications prescribed.  3. Coughing related to GERD can be difficult to treat and is very frustrating!  However, it is important to stick with these medications and lifestyle modifications before pursuing more aggressive or invasive test and treatments.

## 2021-11-03 NOTE — Progress Notes (Signed)
Abigail Huerta    [id number]    [birth year]  Primary Care Physician:Velazquez, Fransico Meadow., MD  Referring Physician: Everardo Beals, NP [place],  [place] [id number] Reason for Consultation: shortness of breath Date of Consultation: 11/03/2021  Chief complaint:   Chief Complaint  Patient presents with   Consult    Patient says she's been having a hard time breathing.      HPI: Abigail Huerta is a 65 y.o. woman with past medical history of ADD on ritalin and OSA on CPAP who presents for new patient evaluation of dyspnea.   About three weeks ago went to an urgent care having symptoms of coughing. Was given steroids antibiotics and was told that her xray was unusual. She was told to follow up with a lung doctor.  She feels like there is something fuzzy in her lungs. She brings up clear mucus and sometimes yellow/brown. She also has trouble taking a big deep breath in.   She coughs daily. But that day she had been having declining energy and fatigue for the past three days. She had a hard time getting out of bed. She felt better with steroids and antibiotics.   She now feels back to normal, but the cough is improved.   She has OSA and sleeps with a CPAP, followed by Dr. Amalia Hailey.   She was given an albuterol refill at the urgent care and has used it about once/week. She takes it for wheezing and inability to take a deep breath in.   She lives with her husband and is independent with ADLs but activity limited by pain.   No childhood respiratory disease, asthma or allergies.   She does have reflux and takes an antacid over the counter and it is not well controlled. She has a history of lap band surgery and had it removed in April 2022.   Social history:  Occupation: retired. Now on disability after working at [employer].  Exposures: lives at home with husband.  Smoking history: 33 pack years, quit in 1995.   Social History   Occupational History    Occupation: Retired    Comment: Development worker, community  Tobacco Use   Smoking status: Former    Packs/day: 1.50    Years: 22.00    Pack years: 33.00    Types: Cigarettes    Start date: 01/16/1972    Quit date: 12/27/1993    Years since quitting: 27.8   Smokeless tobacco: Never  Vaping Use   Vaping Use: Never used  Substance and Sexual Activity   Alcohol use: No    Alcohol/week: 0.0 standard drinks   Drug use: No   Sexual activity: Not on file    Relevant family history:  Family History  Problem Relation Age of Onset   Lung disease Son    Heart disease Father     Past Medical History:  Diagnosis Date   ADD (attention deficit disorder)    Anxiety    Arthritis    Bronchitis    12-23-15 being seen today for bronchitis, ? 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   Hyperlipemia    Lyme disease    Osteoarthritis    Osteopenia    Sleep apnea    cpap setting at 9    Past Surgical History:  Procedure Laterality Date   ABDOMINAL HYSTERECTOMY     1985 partial   BACK SURGERY     lower   CARPAL TUNNEL RELEASE Right    EYE SURGERY Left    Retinal surgery   HIP SURGERY Right 2005   Hip replacement   JOINT REPLACEMENT     KNEE ARTHROSCOPY Right    LAPAROSCOPIC GASTRIC BANDING  2005   MAXIMUM ACCESS (MAS)POSTERIOR LUMBAR INTERBODY FUSION (PLIF) 1 LEVEL N/A 12/26/2014   Procedure: FOR MAXIMUM ACCESS (MAS) POSTERIOR LUMBAR INTERBODY FUSION (PLIF) 1 LEVEL;  Surgeon: Eustace Moore, MD;  Location: [facility];  Service: Neurosurgery;  Laterality: N/A;  FOR MAXIMUM ACCESS (MAS) POSTERIOR LUMBAR INTERBODY FUSION  (PLIF) 1 LEVEL LUMBAR 4-5   TENDON REPAIR Right    forearm   TOTAL HIP ARTHROPLASTY Left 04/17/2013   Procedure: TOTAL HIP ARTHROPLASTY ANTERIOR APPROACH;  Surgeon: Mauri Pole, MD;  Location: [facility];  Service: Orthopedics;  Laterality: Left;   TOTAL HIP REVISION Right 01/02/2019   Procedure: TOTAL HIP REVISION;  Surgeon: Paralee Cancel, MD;  Location: [facility];  Service: Orthopedics;  Laterality: Right;  90 mins     Physical Exam: Blood pressure 136/76, pulse 95, temperature 98.4 F (36.9 C), temperature source Oral, height 5\' 7"  (1.702 m), weight 229 lb 6.4 oz (104.1 kg), SpO2 95 %. Gen:      No acute distress ENT:  mallampati IV, dentures, no nasal polyps, mucus membranes moist Lungs:    No increased respiratory effort, symmetric chest wall excursion, clear to auscultation bilaterally, no wheezes or crackles CV:         Regular rate and rhythm; no murmurs, rubs, or gallops.  No pedal edema Abd:      + bowel sounds; soft, non-tender; no distension MSK: no acute synovitis of DIP or PIP joints, no mechanics hands.  Skin:      Warm and dry; no rashes Neuro: normal speech, no focal facial asymmetry Psych: alert and oriented x3, normal mood and affect   Data Reviewed/Medical Decision Making:  Independent interpretation of tests: Imaging:  Review of patient's chest xray Feb 2021 images revealed no acute process. The patient's images have been independently reviewed by me.    PFTs: I have personally reviewed the patient's PFTs and normal pulmonary function in Jan 2018.  PFT Results Latest Ref Rng & Units 01/07/2017 09/03/2016 06/03/2016  FVC-Pre L 3.44 3.65 3.82  FVC-Predicted Pre % 95 101 105  FVC-Post L - - 3.88  FVC-Predicted Post % - - 107  Pre FEV1/FVC % % 81 83 82  Post FEV1/FCV % % - - 86  FEV1-Pre L 2.79 3.02 3.15  FEV1-Predicted Pre % 100 108 113  FEV1-Post L - - 3.34  DLCO uncorrected ml/min/mmHg 22.44 18.36 19.13  DLCO UNC% % 79 64 67  DLCO corrected ml/min/mmHg 24.88 18.83  -  DLCO COR %Predicted % 87 66 -  DLVA Predicted % 91 66 68  TLC L - - 5.84  TLC % Predicted % - - 106  RV % Predicted % - - 97    Labs:  Lab Results  Component Value Date  WBC 16.0 (H) 04/19/2021   HGB 12.3 04/19/2021   HCT 37.4 04/19/2021   MCV 86.2 04/19/2021   PLT 209 04/19/2021   Lab Results  Component Value Date   NA 137 04/19/2021   K 3.8 04/19/2021   CL 100 04/19/2021   CO2 27 04/19/2021     Immunization status:  Immunization History  Administered Date(s) Administered   Influenza, High Dose Seasonal PF 11/15/2019, 09/17/2020   Influenza,inj,Quad PF,6+ Mos 12/23/2016   Influenza-Unspecified 12/23/2016   PFIZER(Purple Top)SARS-COV-2 Vaccination 03/11/2020, 04/01/2020, 01/03/2021   Pneumococcal Conjugate-13 11/15/2019   Pneumococcal Polysaccharide-23 09/03/2016   Tdap 06/25/2015     I reviewed prior external note(s) from ED visits, PCP  I reviewed the result(s) of the labs and imaging as noted above.   I have ordered PFT  Assessment:  Shortness of breath Chronic Cough, likely GERD OSA on CPAP  Plan/Recommendations: Can continue prn albuterol for now. Differential includes smoking related lung disease, asthma, obesity, deconditioning Will get a full set of PFTs to follow up on her lung function Her chronic cough is suspicious for GERD. Will start pantoprazole 40 mg BID.  She is following up with Dr. Rudi Rummage regarding her lap band reversal later this month.  I suspect most of her reflux is mechanical.   Flu shot today.    Return to Care: Return in about 2 months (around 01/03/2022).  Lenice Llamas, MD Pulmonary and [place]  CC: Everardo Beals, NP

## 2022-01-07 ENCOUNTER — Other Ambulatory Visit: Payer: Self-pay

## 2022-01-07 ENCOUNTER — Ambulatory Visit (INDEPENDENT_AMBULATORY_CARE_PROVIDER_SITE_OTHER): Payer: Medicare Other | Admitting: Internal Medicine

## 2022-01-07 ENCOUNTER — Encounter: Payer: Self-pay | Admitting: Internal Medicine

## 2022-01-07 VITALS — BP 110/60 | HR 71 | Temp 98.2°F | Ht 67.0 in | Wt 226.0 lb

## 2022-01-07 DIAGNOSIS — R0602 Shortness of breath: Secondary | ICD-10-CM | POA: Diagnosis not present

## 2022-01-07 DIAGNOSIS — J4530 Mild persistent asthma, uncomplicated: Secondary | ICD-10-CM | POA: Diagnosis not present

## 2022-01-07 DIAGNOSIS — K219 Gastro-esophageal reflux disease without esophagitis: Secondary | ICD-10-CM | POA: Diagnosis not present

## 2022-01-07 LAB — PULMONARY FUNCTION TEST
DL/VA % pred: 78 %
DL/VA: 3.18 ml/min/mmHg/L
DLCO cor % pred: 73 %
DLCO cor: 16.05 ml/min/mmHg
DLCO unc % pred: 73 %
DLCO unc: 16.05 ml/min/mmHg
FEF 25-75 Post: 4.99 L/sec
FEF 25-75 Pre: 2.97 L/sec
FEF2575-%Change-Post: 68 %
FEF2575-%Pred-Post: 225 %
FEF2575-%Pred-Pre: 134 %
FEV1-%Change-Post: 11 %
FEV1-%Pred-Post: 107 %
FEV1-%Pred-Pre: 96 %
FEV1-Post: 2.86 L
FEV1-Pre: 2.56 L
FEV1FVC-%Change-Post: 7 %
FEV1FVC-%Pred-Pre: 108 %
FEV6-%Change-Post: 3 %
FEV6-%Pred-Post: 95 %
FEV6-%Pred-Pre: 91 %
FEV6-Post: 3.18 L
FEV6-Pre: 3.07 L
FEV6FVC-%Pred-Post: 104 %
FEV6FVC-%Pred-Pre: 104 %
FVC-%Change-Post: 3 %
FVC-%Pred-Post: 91 %
FVC-%Pred-Pre: 88 %
FVC-Post: 3.18 L
FVC-Pre: 3.07 L
Post FEV1/FVC ratio: 90 %
Post FEV6/FVC ratio: 100 %
Pre FEV1/FVC ratio: 83 %
Pre FEV6/FVC Ratio: 100 %
RV % pred: 72 %
RV: 1.65 L
TLC % pred: 95 %
TLC: 5.26 L

## 2022-01-07 MED ORDER — ALBUTEROL SULFATE HFA 108 (90 BASE) MCG/ACT IN AERS: 2.0000 | INHALATION_SPRAY | Freq: Four times a day (QID) | RESPIRATORY_TRACT | 5 refills | Status: AC | PRN

## 2022-01-07 MED ORDER — SPACER/AERO-HOLDING CHAMBERS DEVI: 0 refills | Status: AC

## 2022-01-07 MED ORDER — FLUTICASONE PROPIONATE HFA 110 MCG/ACT IN AERO: 1.0000 | INHALATION_SPRAY | Freq: Two times a day (BID) | RESPIRATORY_TRACT | 12 refills | Status: AC

## 2022-01-07 NOTE — Patient Instructions (Addendum)
Please schedule follow up scheduled with myself in 3 months.  If my schedule is not open yet, we will contact you with a reminder closer to that time. Please call [phone] if you haven't heard from [place] a month before.   Start flovent inhaler 1 puff twice a day. Gargle after use. Use with spacer.   Take the albuterol rescue inhaler every 4 to 6 hours as needed for wheezing or shortness of breath. You can also take it 15 minutes before exercise or exertional activity. Side effects include heart racing or pounding, jitters or anxiety. If you have a history of an irregular heart rhythm, it can make this worse. Can also give some patients a hard time sleeping.  Continue the acid reflux medicine twice a day.

## 2022-01-07 NOTE — Progress Notes (Signed)
Abigail Huerta    [id number]    Oct 13, 1956  Primary Care Physician:Velazquez, Fransico Meadow., MD Date of Appointment: 01/07/2022 Established Patient Visit  Chief complaint:   Chief Complaint  Patient presents with   Follow-up    PFT results     HPI: Abigail Huerta is a 66 y.o. woman with history of lap bad surgery who presents with cough and shortness of breath.  Interval Updates: Here for follow up after PFTs. Normal pulmonary function with positive BD response. She does feel objectively better with albuterol  Had a trial of PPI for reflux.  Cough is improved with BID PPI  She did have exposure to silica in her occupation and did not wear masks.   CPAP download reviewed - has only used 7 days in the last 30. Significant leak -and AHI>30  I have reviewed the patient's family social and past medical history and updated as appropriate.   Past Medical History:  Diagnosis Date   ADD (attention deficit disorder)    Anxiety    Arthritis    Bronchitis    12-23-15 being seen today for bronchitis, ? 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   Hyperlipemia    Lyme disease    Osteoarthritis    Osteopenia    Sleep apnea    cpap setting at 9    Past Surgical History:  Procedure Laterality Date   ABDOMINAL HYSTERECTOMY     1985 partial   BACK SURGERY     lower   CARPAL TUNNEL RELEASE Right    EYE SURGERY Left    Retinal surgery   HIP SURGERY Right 2005   Hip replacement   JOINT REPLACEMENT     KNEE ARTHROSCOPY Right    LAPAROSCOPIC GASTRIC BANDING  2005   MAXIMUM ACCESS (MAS)POSTERIOR LUMBAR  INTERBODY FUSION (PLIF) 1 LEVEL N/A 12/26/2014   Procedure: FOR MAXIMUM ACCESS (MAS) POSTERIOR LUMBAR INTERBODY FUSION (PLIF) 1 LEVEL;  Surgeon: Eustace Moore, MD;  Location: [facility];  Service: Neurosurgery;  Laterality: N/A;  FOR MAXIMUM ACCESS (MAS) POSTERIOR LUMBAR INTERBODY FUSION (PLIF) 1 LEVEL LUMBAR 4-5   TENDON REPAIR Right    forearm   TOTAL HIP ARTHROPLASTY Left 04/17/2013   Procedure: TOTAL HIP ARTHROPLASTY ANTERIOR APPROACH;  Surgeon: Mauri Pole, MD;  Location: [facility];  Service: Orthopedics;  Laterality: Left;   TOTAL HIP REVISION Right 01/02/2019   Procedure: TOTAL HIP REVISION;  Surgeon: Paralee Cancel, MD;  Location: [facility];  Service: Orthopedics;  Laterality: Right;  90 mins    Family History  Problem Relation Age of Onset   Lung disease Son    Heart disease Father     Social History   Occupational History   Occupation: Retired    Comment: Development worker, community  Tobacco Use   Smoking status: Former    Packs/day: 1.50    Years: 22.00    Pack years: 33.00    Types: Cigarettes    Start date:  01/16/1972    Quit date: 12/27/1993    Years since quitting: 28.0   Smokeless tobacco: Never  Vaping Use   Vaping Use: Never used  Substance and Sexual Activity   Alcohol use: No    Alcohol/week: 0.0 standard drinks   Drug use: No   Sexual activity: Not on file     Physical Exam: Blood pressure 110/60, pulse 71, temperature 98.2 F (36.8 C), temperature source Oral, height 5\' 7"  (1.702 m), weight 226 lb (102.5 kg), SpO2 93 %.  Gen:      No acute distress ENT:  no nasal polyps, mucus membranes moist Lungs:    No increased respiratory effort, symmetric chest wall excursion, clear to auscultation bilaterally, no wheezes or crackles CV:         Regular rate and rhythm; no murmurs, rubs, or gallops.  No pedal edema   Data Reviewed: Imaging: I have personally reviewed the   PFTs:  PFT Results Latest Ref Rng & Units 01/07/2022 01/07/2017 09/03/2016 06/03/2016  FVC-Pre L 3.07  3.44 3.65 3.82  FVC-Predicted Pre % 88 95 101 105  FVC-Post L 3.18 - - 3.88  FVC-Predicted Post % 91 - - 107  Pre FEV1/FVC % % 83 81 83 82  Post FEV1/FCV % % 90 - - 86  FEV1-Pre L 2.56 2.79 3.02 3.15  FEV1-Predicted Pre % 96 100 108 113  FEV1-Post L 2.86 - - 3.34  DLCO uncorrected ml/min/mmHg 16.05 22.44 18.36 19.13  DLCO UNC% % 73 79 64 67  DLCO corrected ml/min/mmHg 16.05 24.88 18.83 -  DLCO COR %Predicted % 73 87 66 -  DLVA Predicted % 78 91 66 68  TLC L 5.26 - - 5.84  TLC % Predicted % 95 - - 106  RV % Predicted % 72 - - 97   I have personally reviewed the patient's PFTs and normal pulmonary function +bd response  Labs:  Immunization status: Immunization History  Administered Date(s) Administered   Fluad Quad(high Dose 65+) 11/03/2021   Influenza, High Dose Seasonal PF 11/15/2019, 09/17/2020   Influenza,inj,Quad PF,6+ Mos 12/23/2016   Influenza-Unspecified 12/23/2016   PFIZER(Purple Top)SARS-COV-2 Vaccination 03/11/2020, 04/01/2020, 01/03/2021   Pneumococcal Conjugate-13 11/15/2019   Pneumococcal Polysaccharide-23 09/03/2016   Tdap 06/25/2015    External Records Personally Reviewed:   Assessment:  Mild persistent asthma, new diagnosis OSA on CPAP  Plan/Recommendations:  Start flovent 1 puff BID Continue albuteorl prn - will resend.  Encouraged her to wear cpap  Return to Care: Return in about 3 months (around 04/07/2022).   Lenice Llamas, MD Pulmonary and [place]

## 2022-01-07 NOTE — Progress Notes (Signed)
Full PFT performed today. °

## 2022-01-07 NOTE — Progress Notes (Signed)
The patient has been prescribed the inhaler flovent. Inhaler technique was demonstrated to patient. The patient subsequently demonstrated correct technique.

## 2022-01-07 NOTE — Patient Instructions (Signed)
Full PFT performed today. °

## 2022-03-10 ENCOUNTER — Ambulatory Visit: Payer: Medicare Other | Admitting: Dermatology

## 2022-06-21 ENCOUNTER — Encounter: Payer: Self-pay | Admitting: Internal Medicine

## 2022-06-21 ENCOUNTER — Ambulatory Visit (INDEPENDENT_AMBULATORY_CARE_PROVIDER_SITE_OTHER): Payer: Medicare Other | Admitting: Internal Medicine

## 2022-06-21 VITALS — BP 126/80 | HR 91 | Resp 18 | Ht 67.0 in | Wt 222.6 lb

## 2022-06-21 DIAGNOSIS — T402X5A Adverse effect of other opioids, initial encounter: Secondary | ICD-10-CM

## 2022-06-21 DIAGNOSIS — Z1159 Encounter for screening for other viral diseases: Secondary | ICD-10-CM

## 2022-06-21 DIAGNOSIS — E559 Vitamin D deficiency, unspecified: Secondary | ICD-10-CM | POA: Diagnosis not present

## 2022-06-21 DIAGNOSIS — Z136 Encounter for screening for cardiovascular disorders: Secondary | ICD-10-CM

## 2022-06-21 DIAGNOSIS — E669 Obesity, unspecified: Secondary | ICD-10-CM | POA: Diagnosis not present

## 2022-06-21 DIAGNOSIS — R5383 Other fatigue: Secondary | ICD-10-CM | POA: Diagnosis not present

## 2022-06-21 DIAGNOSIS — Z9989 Dependence on other enabling machines and devices: Secondary | ICD-10-CM

## 2022-06-21 DIAGNOSIS — R7301 Impaired fasting glucose: Secondary | ICD-10-CM | POA: Diagnosis not present

## 2022-06-21 DIAGNOSIS — Z79899 Other long term (current) drug therapy: Secondary | ICD-10-CM

## 2022-06-21 DIAGNOSIS — D500 Iron deficiency anemia secondary to blood loss (chronic): Secondary | ICD-10-CM

## 2022-06-21 DIAGNOSIS — Z6834 Body mass index (BMI) 34.0-34.9, adult: Secondary | ICD-10-CM

## 2022-06-21 DIAGNOSIS — K5903 Drug induced constipation: Secondary | ICD-10-CM

## 2022-06-21 DIAGNOSIS — G4733 Obstructive sleep apnea (adult) (pediatric): Secondary | ICD-10-CM

## 2022-06-21 DIAGNOSIS — J42 Unspecified chronic bronchitis: Secondary | ICD-10-CM

## 2022-06-21 NOTE — Progress Notes (Signed)
   Subjective:   Patient ID: Abigail Huerta, female    DOB: 12/01/1956, 66 y.o.   MRN: [id number]  HPI The patient is a new 66 YO coming in for some concerns.   PMH, [facility], social history reviewed and updated  Review of Systems  Constitutional:  Positive for activity change, fatigue and unexpected weight change.  HENT: Negative.    Eyes: Negative.   Respiratory:  Negative for cough, chest tightness and shortness of breath.   Cardiovascular:  Negative for chest pain, palpitations and leg swelling.  Gastrointestinal:  Negative for abdominal distention, abdominal pain, constipation, diarrhea, nausea and vomiting.  Musculoskeletal:  Positive for arthralgias and myalgias.  Skin: Negative.   Neurological: Negative.   Psychiatric/Behavioral: Negative.      Objective:  Physical Exam Constitutional:      Appearance: She is well-developed.  HENT:     Head: Normocephalic and atraumatic.  Cardiovascular:     Rate and Rhythm: Normal rate and regular rhythm.  Pulmonary:     Effort: Pulmonary effort is normal. No respiratory distress.     Breath sounds: Normal breath sounds. No wheezing or rales.  Abdominal:     General: Bowel sounds are normal. There is no distension.     Palpations: Abdomen is soft.     Tenderness: There is no abdominal tenderness. There is no rebound.  Musculoskeletal:     Cervical back: Normal range of motion.  Skin:    General: Skin is warm and dry.  Neurological:     Mental Status: She is alert and oriented to person, place, and time.     Coordination: Coordination normal.     Vitals:   06/21/22 1550  Weight: 222 lb 9.6 oz (101 kg)  Height: '5\' 7"'$  (1.702 m)    Assessment & Plan:

## 2022-06-22 LAB — CBC
HCT: 36.3 % (ref 36.0–46.0)
Hemoglobin: 12 g/dL (ref 12.0–15.0)
MCHC: 33 g/dL (ref 30.0–36.0)
MCV: 85.2 fl (ref 78.0–100.0)
Platelets: 302 10*3/uL (ref 150.0–400.0)
RBC: 4.26 Mil/uL (ref 3.87–5.11)
RDW: 20.3 % — ABNORMAL HIGH (ref 11.5–15.5)
WBC: 10.2 10*3/uL (ref 4.0–10.5)

## 2022-06-22 LAB — TSH: TSH: 1.13 u[IU]/mL (ref 0.35–5.50)

## 2022-06-22 LAB — LIPID PANEL
Cholesterol: 192 mg/dL (ref 0–200)
HDL: 60.1 mg/dL (ref >39.00)
LDL Cholesterol: 116 mg/dL — ABNORMAL HIGH (ref 0–99)
NonHDL: 132.29
Total CHOL/HDL Ratio: 3
Triglycerides: 83 mg/dL (ref 0.0–149.0)
VLDL: 16.6 mg/dL (ref 0.0–40.0)

## 2022-06-22 LAB — COMPREHENSIVE METABOLIC PANEL
ALT: 28 U/L (ref 0–35)
AST: 19 U/L (ref 0–37)
Albumin: 4.4 g/dL (ref 3.5–5.2)
Alkaline Phosphatase: 89 U/L (ref 39–117)
BUN: 13 mg/dL (ref 6–23)
CO2: 29 mEq/L (ref 19–32)
Calcium: 9.8 mg/dL (ref 8.4–10.5)
Chloride: 100 mEq/L (ref 96–112)
Creatinine, Ser: 1.05 mg/dL (ref 0.40–1.20)
GFR: 54.62 mL/min — ABNORMAL LOW (ref >60.00)
Glucose, Bld: 96 mg/dL (ref 70–99)
Potassium: 4.4 mEq/L (ref 3.5–5.1)
Sodium: 137 mEq/L (ref 135–145)
Total Bilirubin: 0.3 mg/dL (ref 0.2–1.2)
Total Protein: 7.1 g/dL (ref 6.0–8.3)

## 2022-06-22 LAB — VITAMIN B12: Vitamin B-12: 585 pg/mL (ref 211–911)

## 2022-06-22 LAB — T4, FREE: Free T4: 0.97 ng/dL (ref 0.60–1.60)

## 2022-06-22 LAB — HEMOGLOBIN A1C: Hgb A1c MFr Bld: 5.9 % (ref 4.6–6.5)

## 2022-06-22 LAB — VITAMIN D 25 HYDROXY (VIT D DEFICIENCY, FRACTURES): VITD: 43.59 ng/mL (ref 30.00–100.00)

## 2022-06-23 ENCOUNTER — Encounter: Payer: Self-pay | Admitting: Internal Medicine

## 2022-06-23 LAB — HEPATITIS C ANTIBODY: Hepatitis C Ab: NONREACTIVE

## 2022-06-24 DIAGNOSIS — E559 Vitamin D deficiency, unspecified: Secondary | ICD-10-CM | POA: Insufficient documentation

## 2022-06-24 DIAGNOSIS — R7301 Impaired fasting glucose: Secondary | ICD-10-CM | POA: Insufficient documentation

## 2022-06-24 DIAGNOSIS — R5383 Other fatigue: Secondary | ICD-10-CM | POA: Insufficient documentation

## 2022-06-24 DIAGNOSIS — Z79899 Other long term (current) drug therapy: Secondary | ICD-10-CM | POA: Insufficient documentation

## 2022-06-24 DIAGNOSIS — T402X5A Adverse effect of other opioids, initial encounter: Secondary | ICD-10-CM | POA: Insufficient documentation

## 2022-06-24 NOTE — Assessment & Plan Note (Signed)
She is taking multiple medications with opposing actions including oxycodone and fentanyl and ativan which can be sedating mixed with methylphenidate which is activating. She is experiencing constipation from her medication and has to take additional medication to combat this.

## 2022-06-24 NOTE — Assessment & Plan Note (Signed)
She is not wearing regularly.

## 2022-06-24 NOTE — Assessment & Plan Note (Signed)
Taking otc medications to help with this and adequate at this time.

## 2022-06-24 NOTE — Assessment & Plan Note (Signed)
We are checking CBC, CMP, vitamin D and B12 and TSH and free T4 and hep C to assess etiology. We did discuss that this could be medication side effect as she is in on multiple sedating medications including fentanyl, oxycodone, ativan as well as polypharmacy.

## 2022-06-24 NOTE — Assessment & Plan Note (Signed)
She wishes to optimize weight loss we are checking HgA1c, vitamin D and B12 and TSH and free T4 to assess.

## 2022-06-24 NOTE — Assessment & Plan Note (Signed)
Has not had CBC for recheck after surgery. Checking CBC today.

## 2022-06-24 NOTE — Assessment & Plan Note (Signed)
Former smoker and advised never to resume. Using albuterol prn and sufficient for now.

## 2022-06-24 NOTE — Assessment & Plan Note (Signed)
Checking HgA1c and adjust as needed.  

## 2022-06-24 NOTE — Assessment & Plan Note (Signed)
Checking vitamin D level and adjust as needed. Taking otc.  

## 2022-06-29 DIAGNOSIS — M5412 Radiculopathy, cervical region: Secondary | ICD-10-CM | POA: Insufficient documentation

## 2022-07-07 ENCOUNTER — Emergency Department (HOSPITAL_COMMUNITY): Payer: Medicare Other

## 2022-07-07 ENCOUNTER — Encounter (HOSPITAL_COMMUNITY): Payer: Self-pay

## 2022-07-07 ENCOUNTER — Other Ambulatory Visit: Payer: Self-pay

## 2022-07-07 ENCOUNTER — Emergency Department (HOSPITAL_COMMUNITY)
Admission: EM | Admit: 2022-07-07 | Discharge: 2022-07-08 | Disposition: A | Discharge status: Home / Self Care | Payer: Medicare Other | Attending: Emergency Medicine | Admitting: Emergency Medicine

## 2022-07-07 DIAGNOSIS — R531 Weakness: Secondary | ICD-10-CM | POA: Diagnosis present

## 2022-07-07 DIAGNOSIS — J189 Pneumonia, unspecified organism: Secondary | ICD-10-CM | POA: Insufficient documentation

## 2022-07-07 DIAGNOSIS — R944 Abnormal results of kidney function studies: Secondary | ICD-10-CM | POA: Diagnosis not present

## 2022-07-07 DIAGNOSIS — D72829 Elevated white blood cell count, unspecified: Secondary | ICD-10-CM | POA: Diagnosis not present

## 2022-07-07 DIAGNOSIS — Z7982 Long term (current) use of aspirin: Secondary | ICD-10-CM | POA: Diagnosis not present

## 2022-07-07 DIAGNOSIS — R42 Dizziness and giddiness: Secondary | ICD-10-CM | POA: Insufficient documentation

## 2022-07-07 DIAGNOSIS — D649 Anemia, unspecified: Secondary | ICD-10-CM | POA: Insufficient documentation

## 2022-07-07 DIAGNOSIS — Z20822 Contact with and (suspected) exposure to covid-19: Secondary | ICD-10-CM | POA: Insufficient documentation

## 2022-07-07 LAB — URINALYSIS, ROUTINE W REFLEX MICROSCOPIC
Bilirubin Urine: NEGATIVE
Glucose, UA: NEGATIVE mg/dL
Hgb urine dipstick: NEGATIVE
Ketones, ur: NEGATIVE mg/dL
Leukocytes,Ua: NEGATIVE
Nitrite: NEGATIVE
Protein, ur: NEGATIVE mg/dL
Specific Gravity, Urine: 1.008 (ref 1.005–1.030)
pH: 5 (ref 5.0–8.0)

## 2022-07-07 LAB — CBC WITH DIFFERENTIAL/PLATELET
Abs Immature Granulocytes: 0.1 10*3/uL — ABNORMAL HIGH (ref 0.00–0.07)
Basophils Absolute: 0.1 10*3/uL (ref 0.0–0.1)
Basophils Relative: 0 %
Eosinophils Absolute: 0.3 10*3/uL (ref 0.0–0.5)
Eosinophils Relative: 2 %
HCT: 32.3 % — ABNORMAL LOW (ref 36.0–46.0)
Hemoglobin: 10.4 g/dL — ABNORMAL LOW (ref 12.0–15.0)
Immature Granulocytes: 1 %
Lymphocytes Relative: 8 %
Lymphs Abs: 1.5 10*3/uL (ref 0.7–4.0)
MCH: 28.7 pg (ref 26.0–34.0)
MCHC: 32.2 g/dL (ref 30.0–36.0)
MCV: 89 fL (ref 80.0–100.0)
Monocytes Absolute: 1 10*3/uL (ref 0.1–1.0)
Monocytes Relative: 5 %
Neutro Abs: 17 10*3/uL — ABNORMAL HIGH (ref 1.7–7.7)
Neutrophils Relative %: 84 %
Platelets: 252 10*3/uL (ref 150–400)
RBC: 3.63 MIL/uL — ABNORMAL LOW (ref 3.87–5.11)
RDW: 17.2 % — ABNORMAL HIGH (ref 11.5–15.5)
WBC: 20.1 10*3/uL — ABNORMAL HIGH (ref 4.0–10.5)
nRBC: 0 % (ref 0.0–0.2)

## 2022-07-07 LAB — COMPREHENSIVE METABOLIC PANEL
ALT: 12 U/L (ref 0–44)
AST: 16 U/L (ref 15–41)
Albumin: 3.7 g/dL (ref 3.5–5.0)
Alkaline Phosphatase: 81 U/L (ref 38–126)
Anion gap: 9 (ref 5–15)
BUN: 12 mg/dL (ref 8–23)
CO2: 24 mmol/L (ref 22–32)
Calcium: 8.8 mg/dL — ABNORMAL LOW (ref 8.9–10.3)
Chloride: 103 mmol/L (ref 98–111)
Creatinine, Ser: 1.02 mg/dL — ABNORMAL HIGH (ref 0.44–1.00)
GFR, Estimated: 60 mL/min — ABNORMAL LOW (ref >60)
Glucose, Bld: 116 mg/dL — ABNORMAL HIGH (ref 70–99)
Potassium: 4.7 mmol/L (ref 3.5–5.1)
Sodium: 136 mmol/L (ref 135–145)
Total Bilirubin: 0.5 mg/dL (ref 0.3–1.2)
Total Protein: 6.2 g/dL — ABNORMAL LOW (ref 6.5–8.1)

## 2022-07-07 NOTE — ED Triage Notes (Signed)
Reports got up feeling bad like she was ran over by a car.  But tried to get out of bed and put pants on and fell.  Went to bathroom  and fell again.

## 2022-07-07 NOTE — ED Provider Triage Note (Signed)
Emergency Medicine Provider Triage Evaluation Note  Abigail Huerta , a 66 y.o. female  was evaluated in triage.  Pt complains of generalized weakness, generalized pain, and multiple falls.  Patient reports that she woke up this morning feeling like she was run over by car.  Patient is complaining of pain in the entire body.  Patient states that she is having general weakness in her entire body.  Patient reports that she fell twice today due to her knees giving out on her.  Patient denies any her head or any loss of consciousness.  Patient reports that she has had intermittent swelling to her left arm and numbness to her left arm however none at present.  Review of Systems  Positive: Generalized myalgia, generalized weakness, neck pain Negative: Back pain, numbness, weakness, facial asymmetry, dysarthria, Donnell pain, nausea, vomiting, diarrhea, chest pain, shortness of breath, fever, chills  Physical Exam  BP (!) 123/102 (BP Location: Right Arm)   Pulse 91   Temp (!) 97.4 F (36.3 C) (Oral)   Resp 16   SpO2 92%  Gen:   Awake, no distress   Resp:  Normal effort  MSK:   Moves extremities without difficulty  Other:  No midline tenderness or deformity to cervical, thoracic, or lumbar spine.  Patient able to stand and ambulate with energy gait.  Medical Decision Making  Medically screening exam initiated at 4:46 PM.  Appropriate orders placed.  Abigail Huerta was informed that the remainder of the evaluation will be completed by another provider, this initial triage assessment does not replace that evaluation, and the importance of remaining in the ED until their evaluation is complete.     [name], [place] 07/07/22 1647

## 2022-07-08 ENCOUNTER — Emergency Department (HOSPITAL_COMMUNITY): Payer: Medicare Other

## 2022-07-08 DIAGNOSIS — J189 Pneumonia, unspecified organism: Secondary | ICD-10-CM | POA: Diagnosis not present

## 2022-07-08 LAB — RESP PANEL BY RT-PCR (FLU A&B, COVID) ARPGX2
Influenza A by PCR: NEGATIVE
Influenza B by PCR: NEGATIVE
SARS Coronavirus 2 by RT PCR: NEGATIVE

## 2022-07-08 LAB — LACTIC ACID, PLASMA: Lactic Acid, Venous: 0.6 mmol/L (ref 0.5–1.9)

## 2022-07-08 MED ORDER — DOXYCYCLINE HYCLATE 100 MG PO TABS
100.0000 mg | ORAL_TABLET | Freq: Once | ORAL | Status: AC
  Administered 2022-07-08: 100 mg via ORAL
  Filled 2022-07-08: qty 1

## 2022-07-08 MED ORDER — LACTATED RINGERS IV BOLUS
500.0000 mL | Freq: Once | INTRAVENOUS | Status: AC
  Administered 2022-07-08: 500 mL via INTRAVENOUS

## 2022-07-08 MED ORDER — DOXYCYCLINE HYCLATE 100 MG PO CAPS: 100.0000 mg | ORAL_CAPSULE | Freq: Two times a day (BID) | ORAL | 0 refills | Status: AC

## 2022-07-08 MED ORDER — DOXYCYCLINE HYCLATE 100 MG PO CAPS: 100.0000 mg | ORAL_CAPSULE | Freq: Two times a day (BID) | ORAL | 0 refills | Status: DC

## 2022-07-08 MED ORDER — IOHEXOL 300 MG/ML  SOLN
75.0000 mL | Freq: Once | INTRAMUSCULAR | Status: AC | PRN
  Administered 2022-07-08: 75 mL via INTRAVENOUS

## 2022-07-08 NOTE — ED Provider Notes (Addendum)
[facility] Provider Note   CSN: [id number] Arrival date & time: 07/07/22  1621     History  Chief Complaint  Patient presents with   Dizziness    Abigail Huerta is a 66 y.o. female.  HPI 66 year old female with depression, fibromyalgia, carpal tunnel syndrome, chronic pain, hyperlipidemia, anxiety presents to the ER for concerns of weakness, dizziness, and 2 falls while at home today.  She has been having a cough for about 2 weeks that has not been productive.  She has felt weak and tired and has had poor p.o. intake.  Today she tried to get out of bed and put on some pants and fell.  She states that she may have hit her head but did not lose consciousness.  She then got up to the bathroom to go walk again and felt weak and fell again which prompted her to be evaluated in the ER.  She denies any chest pain, no significant shortness of breath.  Denies any headache, nausea, vomiting, blurry vision, weakness.  She reports she feels like she has been "hit by a truck".  Reports generalized weakness and body aches.  She denies any urinary symptoms.  She denies any hematochezia, bloody stools.  Not on anticoagulation.  She denies any COPD diagnosis or asthma, endorses a remote smoking history but not at present.   She also reports intermittent left arm swelling for the last several months but none at present.     Home Medications Prior to Admission medications   Medication Sig Start Date End Date Taking? Authorizing Provider  doxycycline (VIBRAMYCIN) 100 MG capsule Take 1 capsule (100 mg total) by mouth 2 (two) times daily. 07/08/22  Yes Garald Balding, PA-C  albuterol (VENTOLIN HFA) 108 (90 Base) MCG/ACT inhaler Inhale 2 puffs into the lungs every 6 (six) hours as needed for shortness of breath. 01/07/22   Spero Geralds, MD  aspirin EC 81 MG tablet Take 81 mg by mouth daily.    [provider]  buPROPion (WELLBUTRIN XL) 150 MG 24 hr tablet Take  150 mg by mouth daily. Total daily dose=450 mg    [provider]  buPROPion (WELLBUTRIN XL) 300 MG 24 hr tablet Take 300 mg by mouth daily. Total daily dose=450 mg    [provider]  Cyanocobalamin (VITAMIN B 12 PO) Take 1 tablet by mouth daily.    [provider]  diphenhydrAMINE (BENADRYL) 25 mg capsule Take 25-50 mg by mouth daily as needed for allergies.    [provider]  escitalopram (LEXAPRO) 20 MG tablet Take 20 mg by mouth daily.  04/22/16   [provider]  fentaNYL (DURAGESIC - DOSED MCG/HR) 100 MCG/HR Place 100 mcg onto the skin every other day.     [provider]  fluticasone (FLOVENT HFA) 110 MCG/ACT inhaler Inhale 1 puff into the lungs in the morning and at bedtime. 01/07/22   Spero Geralds, MD  leucovorin (WELLCOVORIN) 5 MG tablet Take 5 mg by mouth 2 (two) times a week. 06/07/22   [provider]  LORazepam (ATIVAN) 1 MG tablet Take 2 mg by mouth at bedtime.     [provider]  methotrexate (RHEUMATREX) 2.5 MG tablet Take 15 mg by mouth once a week. 5 times weekly 05/16/22   [provider]  methylphenidate (RITALIN) 20 MG tablet Take 20 mg by mouth 2 (two) times daily.  04/22/16   [provider]  oxyCODONE-acetaminophen (PERCOCET)  10-325 MG tablet Take 1-2 tablets by mouth every 6 (six) hours as needed for pain. 01/03/19   Danae Orleans, PA-C  pantoprazole (PROTONIX) 40 MG tablet Take 1 tablet (40 mg total) by mouth 2 (two) times daily before a meal. 11/03/21   Spero Geralds, MD  promethazine (PHENERGAN) 25 MG tablet Take 25 mg by mouth every 6 (six) hours as needed for nausea.    [provider]  senna (SENOKOT) 8.6 MG tablet Take 2 tablets by mouth daily.    [provider]  Spacer/Aero-Holding Josiah Lobo DEVI To use with MDI 01/07/22   Spero Geralds, MD  ferrous sulfate (FERROUSUL) 325 (65 FE) MG tablet Take 1 tablet (325 mg total) by mouth 3 (three) times daily with  meals. 01/02/19 03/07/20  Danae Orleans, PA-C      Allergies    Morphine, Pollen extract, and Sulfa antibiotics    Review of Systems   Review of Systems Ten systems reviewed and are negative for acute change, except as noted in the HPI.   Physical Exam Updated Vital Signs BP 135/79   Pulse 83   Temp 97.8 F (36.6 C) (Oral)   Resp 11   SpO2 99%  Physical Exam Vitals and nursing note reviewed.  Constitutional:      General: She is not in acute distress.    Appearance: She is well-developed.  HENT:     Head: Normocephalic and atraumatic.  Eyes:     Conjunctiva/sclera: Conjunctivae normal.  Cardiovascular:     Rate and Rhythm: Normal rate and regular rhythm.     Heart sounds: No murmur heard. Pulmonary:     Effort: Pulmonary effort is normal. No respiratory distress.     Breath sounds: Rhonchi present.     Comments: Scattered rhonchi in the right upper and right lower lobes Abdominal:     Palpations: Abdomen is soft.     Tenderness: There is no abdominal tenderness.  Musculoskeletal:        General: No swelling or tenderness.     Cervical back: Neck supple.  Skin:    General: Skin is warm and dry.     Capillary Refill: Capillary refill takes less than 2 seconds.  Neurological:     Mental Status: She is alert.     Comments: Mental Status:  Alert, thought content appropriate, able to give a coherent history. Speech fluent without evidence of aphasia. Able to follow 2 step commands without difficulty.  Cranial Nerves:  II:  Peripheral visual fields grossly normal, pupils equal, round, reactive to light III,IV, VI: ptosis not present, extra-ocular motions intact bilaterally  V,VII: smile symmetric, facial light touch sensation equal VIII: hearing grossly normal to voice  X: uvula elevates symmetrically  XI: bilateral shoulder shrug symmetric and strong XII: midline tongue extension without fassiculations Motor:  Normal tone. 5/5 strength of BUE and BLE major muscle  groups including strong and equal grip strength and dorsiflexion/plantar flexion Sensory: light touch normal in all extremities. Cerebellar: normal finger-to-nose with bilateral upper extremities, Romberg sign absent Gait: normal gait and balance     Psychiatric:        Mood and Affect: Mood normal.     ED Results / Procedures / Treatments   Labs (all labs ordered are listed, but only abnormal results are displayed) Labs Reviewed  COMPREHENSIVE METABOLIC PANEL - Abnormal; Notable for the following components:      Result Value   Glucose, Bld 116 (*)    Creatinine, Ser  1.02 (*)    Calcium 8.8 (*)    Total Protein 6.2 (*)    GFR, Estimated 60 (*)    All other components within normal limits  CBC WITH DIFFERENTIAL/PLATELET - Abnormal; Notable for the following components:   WBC 20.1 (*)    RBC 3.63 (*)    Hemoglobin 10.4 (*)    HCT 32.3 (*)    RDW 17.2 (*)    Neutro Abs 17.0 (*)    Abs Immature Granulocytes 0.10 (*)    All other components within normal limits  RESP PANEL BY RT-PCR (FLU A&B, COVID) ARPGX2  CULTURE, BLOOD (ROUTINE X 2)  CULTURE, BLOOD (ROUTINE X 2)  URINALYSIS, ROUTINE W REFLEX MICROSCOPIC  LACTIC ACID, PLASMA  LACTIC ACID, PLASMA    EKG EKG Interpretation  Date/Time:  Wednesday July 07 2022 16:39:33 EDT Ventricular Rate:  93 PR Interval:  144 QRS Duration: 98 QT Interval:  350 QTC Calculation: 435 R Axis:   36 Text Interpretation: Normal sinus rhythm Normal ECG When compared with ECG of 05-Feb-2020 18:47, No significant change was found Confirmed by Delora Fuel ([id number]) on 07/08/2022 2:52:03 AM  Radiology CT Chest W Contrast  Result Date: 07/08/2022 CLINICAL DATA:  Weakness, pain, multiple falls EXAM: CT CHEST WITH CONTRAST TECHNIQUE: Multidetector CT imaging of the chest was performed during intravenous contrast administration. RADIATION DOSE REDUCTION: This exam was performed according to the departmental dose-optimization program which includes  automated exposure control, adjustment of the mA and/or kV according to patient size and/or use of iterative reconstruction technique. CONTRAST:  7m OMNIPAQUE IOHEXOL 300 MG/ML  SOLN COMPARISON:  Chest radiograph dated 07/07/2022. FINDINGS: Cardiovascular: The heart is normal in size. No pericardial effusion. No evidence of thoracic aortic aneurysm. Mediastinum/Nodes: Small mediastinal lymph nodes, including a 10 mm short axis low right paratracheal node and subcarinal nodes, likely reactive. Visualized thyroid is unremarkable. Lungs/Pleura: Scattered ground-glass patchy/nodular opacities in the lungs bilaterally, lower lobe predominant, suggesting multifocal infection/pneumonia. No pleural effusion or pneumothorax. Upper Abdomen: Visualized upper abdomen is notable for a small hiatal hernia. Musculoskeletal: Mild superior endplate changes at TQ6-7 IMPRESSION: Multifocal pneumonia, lower lobe predominant. Electronically Signed   By: SJulian HyM.D.   On: 07/08/2022 03:29   DG Chest 2 View  Result Date: 07/07/2022 CLINICAL DATA:  Chest pain. EXAM: CHEST - 2 VIEW COMPARISON:  Chest x-ray 02/05/2020 FINDINGS: The heart size and mediastinal contours are within normal limits. There interstitial opacities in the bases. No pleural effusion or pneumothorax. The visualized skeletal structures are unremarkable. IMPRESSION: Minimal interstitial opacities in the lung bases may represent mild edema or infectious/inflammatory process. No focal lung infiltrate. Electronically Signed   By: ARonney AstersM.D.   On: 07/07/2022 20:30   CT Cervical Spine Wo Contrast  Result Date: 07/07/2022 CLINICAL DATA:  Neck trauma. EXAM: CT CERVICAL SPINE WITHOUT CONTRAST TECHNIQUE: Multidetector CT imaging of the cervical spine was performed without intravenous contrast. Multiplanar CT image reconstructions were also generated. RADIATION DOSE REDUCTION: This exam was performed according to the departmental dose-optimization  program which includes automated exposure control, adjustment of the mA and/or kV according to patient size and/or use of iterative reconstruction technique. COMPARISON:  September 21, 2016 FINDINGS: Alignment: Normal. Skull base and vertebrae: No acute fracture. No primary bone lesion or focal pathologic process. Soft tissues and spinal canal: No prevertebral fluid or swelling. No visible canal hematoma. Disc levels:  Normal Upper chest: Dilated proximal esophagus. Other: None. IMPRESSION: 1. No acute fracture or dislocation of  the cervical spine. 2. Dilated proximal esophagus, incompletely visualized. Please correlate clinically. Electronically Signed   By: Fidela Salisbury M.D.   On: 07/07/2022 18:35   CT HEAD WO CONTRAST (5MM)  Result Date: 07/07/2022 CLINICAL DATA:  Trauma, multiple falls EXAM: CT HEAD WITHOUT CONTRAST TECHNIQUE: Contiguous axial images were obtained from the base of the skull through the vertex without intravenous contrast. RADIATION DOSE REDUCTION: This exam was performed according to the departmental dose-optimization program which includes automated exposure control, adjustment of the mA and/or kV according to patient size and/or use of iterative reconstruction technique. COMPARISON:  09/21/2016 FINDINGS: Brain: No acute intracranial findings are seen. There are no signs of bleeding within the cranium. Ventricles are not dilated. Cortical sulci are prominent. Vascular: Unremarkable. Skull: No recent fracture is seen. Radiolucencies are seen along the inner table of the occipital bone with no significant interval change. This may suggest a benign process such as venous lakes or arachnoid granulations from the transverse sinuses. Sinuses/Orbits: There is mild mucosal thickening in ethmoid sinus. Other: None. IMPRESSION: No acute intracranial findings are seen in noncontrast CT brain. Atrophy. Mild chronic ethmoid sinusitis. Electronically Signed   By: Elmer Picker M.D.   On:  07/07/2022 18:34    Procedures Procedures    Medications Ordered in ED Medications  doxycycline (VIBRA-TABS) tablet 100 mg (has no administration in time range)  lactated ringers bolus 500 mL (0 mLs Intravenous Stopped 07/08/22 0346)  iohexol (OMNIPAQUE) 300 MG/ML solution 75 mL (75 mLs Intravenous Contrast Given 07/08/22 0324)    ED Course/ Medical Decision Making/ A&P                           Medical Decision Making Amount and/or Complexity of Data Reviewed Labs: ordered. Radiology: ordered.  Risk Prescription drug management.  66 year old female presenting with weakness, cough.  On arrival, she is overall well-appearing, on room air, speaking full sentences without any evidence of respiratory distress.  Vitals are reassuring, she is afebrile, normotensive, not tachycardic, tachypneic or hypoxic.  On exam she has some scattered rhonchi in the right upper and right lower lobes.  Abdomen is soft and nontender.  Differential diagnosis includes pneumonia, PE, ACS, stroke, UTI  Labs ordered in triage, reviewed and interpreted by me.  CBC with leukocytosis of 20.1, hemoglobin of 10.4, slight lower than baseline but no signs of acute bleed.  CMP without any significant electrode abnormalities, creatinine 1.02 which is slightly elevated from baseline.  Normal LFTs.  UA is negative for infection.  Lactic acid is normal.  Blood cultures are pending.  COVID and flu are negative.  CT of the head, cervical spine and chest x-ray were ordered in triage, personally reviewed, agree with radiology read.  CT of the head and cervical spine overall negative, a dilated proximal esophagus was noted, patient denies any dysphagia, or pain on swallowing.  Likely incidental finding.  Chest x-ray with minimal edema,/possible infectious process.  CT of the chest with contrast was ordered for further delineation of possible pneumonia.  CT of the chest with multifocal pneumonia, lower lobe predominant.  In regards  to her left arm swelling, I see no swelling today.  Per chart review, she does have a history of carpal tunnel syndrome, which could be contributing to this.  I have low suspicion for DVT, no evidence of cellulitis or abscess.  Encouraged her to follow-up with her PCP.  Patient received 500 L cc bolus.  Patient  was ambulated with improvement in dizziness.  Suspect symptoms are secondary to pneumonia.  We will start her on doxycycline per discussion with Dr. Roxanne Mins. She was given a dose of the antibiotic.  She was given very strict instructions to follow-up with her PCP in the next day or 2, we discussed strict return precautions.  I did consider admission however the patient is on room air, no evidence of sepsis despite leukocytosis of 20.1. Ambulated with improvement.   Her lactic acid is normal.  I do think that she is a candidate for outpatient antibiotic therapy, however she was given very strict return precautions if her clinical picture deteriorates.  She is agreeable to this.  Stable for discharge.  This was a shared visit with my supervising physician Dr. Roxanne Mins who independently saw and evaluated the patient & provided guidance in evaluation/management/disposition ,in agreement with care   Final Clinical Impression(s) / ED Diagnoses Final diagnoses:  Community acquired pneumonia, unspecified laterality    Rx / DC Orders ED Discharge Orders          Ordered    doxycycline (VIBRAMYCIN) 100 MG capsule  2 times daily        07/08/22 0349               Garald Balding, PA-C 16/10/96 0454    Delora Fuel, MD 09/81/19 [phone]

## 2022-07-08 NOTE — Discharge Instructions (Addendum)
Your CT scan of the chest showed a multifocal pneumonia. Please take the antibiotic prescribed as directed. Follow up with your primary care doctor in the next 1-2 days. Return to the ER if you have any worsening of your condition.

## 2022-07-13 LAB — CULTURE, BLOOD (ROUTINE X 2)
Culture: NO GROWTH
Culture: NO GROWTH
Special Requests: ADEQUATE
Special Requests: ADEQUATE

## 2022-07-14 ENCOUNTER — Inpatient Hospital Stay: Payer: Medicare Other | Admitting: Internal Medicine

## 2022-07-19 ENCOUNTER — Encounter: Payer: Self-pay | Admitting: Internal Medicine

## 2022-07-19 ENCOUNTER — Ambulatory Visit (INDEPENDENT_AMBULATORY_CARE_PROVIDER_SITE_OTHER): Payer: Medicare Other | Admitting: Internal Medicine

## 2022-07-19 ENCOUNTER — Ambulatory Visit (INDEPENDENT_AMBULATORY_CARE_PROVIDER_SITE_OTHER): Payer: Medicare Other

## 2022-07-19 VITALS — BP 130/80 | HR 86 | Temp 98.0°F | Ht 67.0 in | Wt 223.0 lb

## 2022-07-19 DIAGNOSIS — J0120 Acute ethmoidal sinusitis, unspecified: Secondary | ICD-10-CM | POA: Insufficient documentation

## 2022-07-19 DIAGNOSIS — J189 Pneumonia, unspecified organism: Secondary | ICD-10-CM

## 2022-07-19 DIAGNOSIS — L4050 Arthropathic psoriasis, unspecified: Secondary | ICD-10-CM | POA: Diagnosis not present

## 2022-07-19 DIAGNOSIS — L403 Pustulosis palmaris et plantaris: Secondary | ICD-10-CM | POA: Insufficient documentation

## 2022-07-19 DIAGNOSIS — M797 Fibromyalgia: Secondary | ICD-10-CM | POA: Insufficient documentation

## 2022-07-19 MED ORDER — PREDNISONE 20 MG PO TABS: 40.0000 mg | ORAL_TABLET | Freq: Every day | ORAL | 0 refills | Status: DC

## 2022-07-19 NOTE — Patient Instructions (Signed)
We have sent in the prednisone to take 2 pills daily for 5 days.  We are checking the chest x-ray today.

## 2022-07-19 NOTE — Progress Notes (Unsigned)
   Subjective:   Patient ID: [facility]ail [facility], female    DOB: 06-04-1956, 66 y.o.   MRN: [id number]  HPI The patient is a 66 YO female coming in for follow up ER visit for pneumonia. Feels much improved and finished antibiotics. Is having some joint pain flare up today.  PMH, [facility], social history reviewed and updated  Review of Systems  Constitutional:  Positive for activity change and fatigue.  HENT: Negative.    Eyes: Negative.   Respiratory:  Negative for cough, chest tightness and shortness of breath.   Cardiovascular:  Negative for chest pain, palpitations and leg swelling.  Gastrointestinal:  Negative for abdominal distention, abdominal pain, constipation, diarrhea, nausea and vomiting.  Musculoskeletal: Negative.   Skin: Negative.   Neurological: Negative.   Psychiatric/Behavioral: Negative.      Objective:  Physical Exam Constitutional:      Appearance: She is well-developed.  HENT:     Head: Normocephalic and atraumatic.  Cardiovascular:     Rate and Rhythm: Normal rate and regular rhythm.  Pulmonary:     Effort: Pulmonary effort is normal. No respiratory distress.     Breath sounds: Normal breath sounds. No wheezing or rales.  Abdominal:     General: Bowel sounds are normal. There is no distension.     Palpations: Abdomen is soft.     Tenderness: There is no abdominal tenderness. There is no rebound.  Musculoskeletal:        General: Tenderness present.     Cervical back: Normal range of motion.  Skin:    General: Skin is warm and dry.  Neurological:     Mental Status: She is alert and oriented to person, place, and time.     Coordination: Coordination normal.     Vitals:   07/19/22 1511  BP: 130/80  Pulse: 86  Temp: 98 F (36.7 C)  TempSrc: Oral  SpO2: 94%  Weight: 223 lb (101.2 kg)  Height: '5\' 7"'$  (1.702 m)    Assessment & Plan:  Visit time 20 minutes in face to face communication with patient and coordination of care, additional 10 minutes spent in  record review, coordination or care, ordering tests, communicating/referring to other healthcare professionals, documenting in medical records all on the same day of the visit for total time 30 minutes spent on the visit.

## 2022-07-20 ENCOUNTER — Other Ambulatory Visit: Payer: Self-pay | Admitting: Internal Medicine

## 2022-07-20 DIAGNOSIS — J189 Pneumonia, unspecified organism: Secondary | ICD-10-CM

## 2022-07-22 ENCOUNTER — Encounter: Payer: Self-pay | Admitting: Internal Medicine

## 2022-07-22 DIAGNOSIS — J189 Pneumonia, unspecified organism: Secondary | ICD-10-CM | POA: Insufficient documentation

## 2022-07-22 NOTE — Assessment & Plan Note (Signed)
With flare in several joints today and rx prednisone 5 day course to help alleviate this.

## 2022-07-22 NOTE — Assessment & Plan Note (Signed)
Multifocal bilateral pneumonia, s/p antibiotics and clinically improved. Will check CXR for improvement and if resolved no further follow up. If not improved will recheck in 4-6 weeks.

## 2022-08-10 ENCOUNTER — Ambulatory Visit: Payer: Medicare Other | Admitting: Emergency Medicine

## 2022-09-10 ENCOUNTER — Encounter: Payer: Self-pay | Admitting: Internal Medicine

## 2022-09-10 ENCOUNTER — Ambulatory Visit (INDEPENDENT_AMBULATORY_CARE_PROVIDER_SITE_OTHER): Payer: Medicare Other | Admitting: Internal Medicine

## 2022-09-10 VITALS — BP 126/88 | HR 76 | Ht 67.0 in | Wt 217.0 lb

## 2022-09-10 DIAGNOSIS — R413 Other amnesia: Secondary | ICD-10-CM | POA: Insufficient documentation

## 2022-09-10 DIAGNOSIS — F11282 Opioid dependence with opioid-induced sleep disorder: Secondary | ICD-10-CM | POA: Diagnosis not present

## 2022-09-10 DIAGNOSIS — Z79899 Other long term (current) drug therapy: Secondary | ICD-10-CM | POA: Diagnosis not present

## 2022-09-10 DIAGNOSIS — F1120 Opioid dependence, uncomplicated: Secondary | ICD-10-CM | POA: Insufficient documentation

## 2022-09-10 DIAGNOSIS — Z23 Encounter for immunization: Secondary | ICD-10-CM | POA: Diagnosis not present

## 2022-09-10 MED ORDER — NALOXONE HCL 4 MG/0.1ML NA LIQD: 1.0000 | Freq: Once | NASAL | 0 refills | Status: AC

## 2022-09-10 NOTE — Patient Instructions (Signed)
I would recommend to cut back on the medicines as they can cause all the symptoms you are having.  I would strongly recommend to cut back on the medications as much as possible.   I would recommend to get off the lorazepam altogether and cut back on oxycodone and fentanyl as much as possible.  I have sent in narcan for your family to use if they cannot wake you up.

## 2022-09-10 NOTE — Assessment & Plan Note (Signed)
Suspect due to oversedation with mutliple sedating medications. She has had several episodes recently when she was sleeping and was unable to be aroused for several hours by multiple family members trying to wake her up. I suspect she needs to reduce opioids and had serious discussion about risk of accidental overdose and death with patient and daughter. I have prescribed narcan if she is not arousable for family to use. I will send this information to the providers prescribing her controlled substances. She has not discussed this with them in the past. She was not aware this could be serious and was not aware of risk of overdose.

## 2022-09-10 NOTE — Progress Notes (Signed)
   Subjective:   Patient ID: OTHA [facility]erta, female    DOB: [birth year], 66 y.o.   MRN: [id number]  HPI The patient is a 66 YO female coming in for concerning new symptoms of family members being unable to wake her from sleep. She is feeling drowsy throughout the day and memory problems and fog. She and daughter present deny she stops breathing while sleeping but they are unable to wake her for hours with multiple methods to try to wake her up. She does not have narcan at home. She starts visit with saying her pain is much better controlled with psoriatic arthritis treatment but then is hesitant to talk about dosage reduction of medications. CAP July and did not return for repeat CXR end of August/early September for clearance. Had done at rheumatology.  Review of Systems  Constitutional:  Positive for activity change and fatigue.  HENT: Negative.    Eyes: Negative.   Respiratory:  Negative for cough, chest tightness and shortness of breath.   Cardiovascular:  Negative for chest pain, palpitations and leg swelling.  Gastrointestinal:  Negative for abdominal distention, abdominal pain, constipation, diarrhea, nausea and vomiting.  Musculoskeletal: Negative.   Skin: Negative.   Neurological: Negative.   Psychiatric/Behavioral:  Positive for confusion, decreased concentration and sleep disturbance.     Objective:  Physical Exam Constitutional:      Appearance: She is well-developed.  HENT:     Head: Normocephalic and atraumatic.  Cardiovascular:     Rate and Rhythm: Normal rate and regular rhythm.  Pulmonary:     Effort: Pulmonary effort is normal. No respiratory distress.     Breath sounds: Normal breath sounds. No wheezing or rales.  Abdominal:     General: Bowel sounds are normal. There is no distension.     Palpations: Abdomen is soft.     Tenderness: There is no abdominal tenderness. There is no rebound.  Musculoskeletal:     Cervical back: Normal range of motion.  Skin:     General: Skin is warm and dry.  Neurological:     Mental Status: She is alert and oriented to person, place, and time.     Coordination: Coordination normal.     Vitals:   09/10/22 1347  BP: 126/88  Pulse: 76  SpO2: 96%  Weight: 217 lb (98.4 kg)  Height: '5\' 7"'$  (1.702 m)   Visit time 20 minutes in face to face communication with patient and coordination of care, additional 10 minutes spent in record review, coordination or care, ordering tests, communicating/referring to other healthcare professionals, documenting in medical records all on the same day of the visit for total time 30 minutes spent on the visit.    Assessment & Plan:  Prevnar 20 and flu shot given at visit

## 2022-09-17 ENCOUNTER — Ambulatory Visit (INDEPENDENT_AMBULATORY_CARE_PROVIDER_SITE_OTHER): Payer: Medicare Other | Admitting: Internal Medicine

## 2022-09-17 ENCOUNTER — Ambulatory Visit (INDEPENDENT_AMBULATORY_CARE_PROVIDER_SITE_OTHER): Payer: Medicare Other

## 2022-09-17 VITALS — BP 126/78 | HR 75 | Temp 98.0°F | Ht 67.0 in | Wt 217.0 lb

## 2022-09-17 DIAGNOSIS — R059 Cough, unspecified: Secondary | ICD-10-CM

## 2022-09-17 DIAGNOSIS — F411 Generalized anxiety disorder: Secondary | ICD-10-CM | POA: Diagnosis not present

## 2022-09-17 DIAGNOSIS — E559 Vitamin D deficiency, unspecified: Secondary | ICD-10-CM | POA: Diagnosis not present

## 2022-09-17 MED ORDER — LEVOFLOXACIN 500 MG PO TABS: 500.0000 mg | ORAL_TABLET | Freq: Every day | ORAL | 0 refills | Status: AC

## 2022-09-17 MED ORDER — PREDNISONE 10 MG PO TABS: ORAL_TABLET | ORAL | 0 refills | Status: DC

## 2022-09-17 MED ORDER — HYDROCODONE BIT-HOMATROP MBR 5-1.5 MG/5ML PO SOLN: 5.0000 mL | Freq: Four times a day (QID) | ORAL | 0 refills | Status: AC | PRN

## 2022-09-17 NOTE — Patient Instructions (Signed)
Please take all new medication as prescribed - the antibiotic, cough medicine, and prednisone  Please return if not improved, to consider further testing such as repeat CT scan of chest, or pulmonary referral  Please continue all other medications as before, and refills have been done if requested.  Please have the pharmacy call with any other refills you may need.  Please keep your appointments with your specialists as you may have planned  Please go to the XRAY Department in the first floor for the x-ray testing  You will be contacted by phone if any changes need to be made immediately.  Otherwise, you will receive a letter about your results with an explanation, but please check with MyChart first.  Please remember to sign up for MyChart if you have not done so, as this will be important to you in the future with finding out test results, communicating by private email, and scheduling acute appointments online when needed.

## 2022-09-17 NOTE — Progress Notes (Unsigned)
Patient ID: Abigail Huerta, female   DOB: 06/08/1956, 66 y.o.   MRN: [id number]        Chief Complaint: follow up cough, concerned with possible pneumonia with hx of silica work exposure, nervous, here with family       HPI:  Abigail Huerta is a 66 y.o. female here with hx of episode multifocal pna n July 07 2022, improved and resolved with antibx tx, now,  Here with 2-3 days acute onset fever, facial pain, pressure, headache, general weakness and malaise, and greenish d/c, with mild ST and cough, but pt denies chest pain, orthopnea, PND, increased LE swelling, palpitations, dizziness or syncope, but has been mild sob doe and chest tightness as well, hard to take deep breaths.   Pt denies polydipsia, polyuria, or new focal neuro s/s.    Pt denies recent wt loss, night sweats, loss of appetite, or other constitutional symptoms         Wt Readings from Last 3 Encounters:  09/17/22 217 lb (98.4 kg)  09/10/22 217 lb (98.4 kg)  07/19/22 223 lb (101.2 kg)   BP Readings from Last 3 Encounters:  09/17/22 126/78  09/10/22 126/88  07/19/22 130/80         Past Medical History:  Diagnosis Date   ADD (attention deficit disorder)    Anxiety    Arthritis    Bronchitis    12-23-15 being seen today for bronchitis, ? 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   Hyperlipemia    Lyme disease    Osteoarthritis    Osteopenia    Sleep apnea    cpap setting at 9   Past Surgical History:  Procedure Laterality Date   ABDOMINAL HYSTERECTOMY     1985 partial   BACK SURGERY     lower   CARPAL TUNNEL RELEASE Right    EYE SURGERY Left     Retinal surgery   HIP SURGERY Right 2005   Hip replacement   JOINT REPLACEMENT     KNEE ARTHROSCOPY Right    LAPAROSCOPIC GASTRIC BANDING  2005   MAXIMUM ACCESS (MAS)POSTERIOR LUMBAR INTERBODY FUSION (PLIF) 1 LEVEL N/A 12/26/2014   Procedure: FOR MAXIMUM ACCESS (MAS) POSTERIOR LUMBAR INTERBODY FUSION (PLIF) 1 LEVEL;  Surgeon: Eustace Moore, MD;  Location: [facility];  Service: Neurosurgery;  Laterality: N/A;  FOR MAXIMUM ACCESS (MAS) POSTERIOR LUMBAR INTERBODY FUSION (PLIF) 1 LEVEL LUMBAR 4-5   TENDON REPAIR Right    forearm   TOTAL HIP ARTHROPLASTY Left 04/17/2013   Procedure: TOTAL HIP ARTHROPLASTY ANTERIOR APPROACH;  Surgeon: Mauri Pole, MD;  Location: [facility];  Service: Orthopedics;  Laterality: Left;   TOTAL HIP REVISION Right 01/02/2019   Procedure: TOTAL HIP REVISION;  Surgeon: Paralee Cancel, MD;  Location: [facility];  Service: Orthopedics;  Laterality: Right;  90 mins    reports that she quit smoking about 28 years ago. Her smoking use included cigarettes. She started smoking about 50 years ago. She has a 33.00 pack-year smoking history.  She has never used smokeless tobacco. She reports that she does not drink alcohol and does not use drugs. family history includes Heart disease in her father; Lung disease in her son. Allergies  Allergen Reactions   Morphine     Other reaction(s): Unknown   Pollen Extract     Other reaction(s): Unknown   Sulfa Antibiotics    Current Outpatient Medications on File Prior to Visit  Medication Sig Dispense Refill   albuterol (VENTOLIN HFA) 108 (90 Base) MCG/ACT inhaler Inhale 2 puffs into the lungs every 6 (six) hours as needed for shortness of breath. 18 g 5   aspirin EC 81 MG tablet Take 81 mg by mouth daily.     buPROPion (WELLBUTRIN XL) 150 MG 24 hr tablet Take 150 mg by mouth daily. Total daily dose=450 mg     buPROPion (WELLBUTRIN XL) 300 MG 24 hr tablet Take 300 mg by mouth daily. Total daily dose=450 mg     escitalopram (LEXAPRO) 20 MG  tablet Take 20 mg by mouth daily.   3   fentaNYL (DURAGESIC - DOSED MCG/HR) 100 MCG/HR Place 100 mcg onto the skin every other day.      fluticasone (FLOVENT HFA) 110 MCG/ACT inhaler Inhale 1 puff into the lungs in the morning and at bedtime. 1 each 12   gabapentin (NEURONTIN) 300 MG capsule Take 300 mg by mouth 3 (three) times daily.     leucovorin (WELLCOVORIN) 5 MG tablet Take 5 mg by mouth 2 (two) times a week.     LORazepam (ATIVAN) 1 MG tablet Take 2 mg by mouth at bedtime.   0   methotrexate (RHEUMATREX) 2.5 MG tablet Take 15 mg by mouth once a week. 5 times weekly     methylphenidate (RITALIN) 20 MG tablet Take 20 mg by mouth 2 (two) times daily.   0   oxyCODONE-acetaminophen (PERCOCET) 10-325 MG tablet Take 1-2 tablets by mouth every 6 (six) hours as needed for pain. 60 tablet 0   promethazine (PHENERGAN) 25 MG tablet Take 25 mg by mouth every 6 (six) hours as needed for nausea.     Spacer/Aero-Holding Dorise Bullion To use with MDI 1 each 0   [DISCONTINUED] ferrous sulfate (FERROUSUL) 325 (65 FE) MG tablet Take 1 tablet (325 mg total) by mouth 3 (three) times daily with meals.  3   No current facility-administered medications on file prior to visit.        ROS:  All others reviewed and negative.  Objective        PE:  BP 126/78 (BP Location: Right Arm, Patient Position: Sitting, Cuff Size: Large)   Pulse 75   Temp 98 F (36.7 C) (Oral)   Ht '5\' 7"'$  (1.702 m)   Wt 217 lb (98.4 kg)   SpO2 95%   BMI 33.99 kg/m                 Constitutional: Pt appears in NAD, mild ill               HENT: Head: NCAT.                Right Ear: External ear normal.                 Left Ear: External ear normal.  Bilat tm's with mild erythema.  Max sinus areas mild tender.  Pharynx with mild erythema, no exudate  Eyes: . Pupils are equal, round, and reactive to light. Conjunctivae and EOM are normal               Nose: without d/c or deformity               Neck: Neck supple. Gross  normal ROM               Cardiovascular: Normal rate and regular rhythm.                 Pulmonary/Chest: Effort normal and breath sounds without rales or wheezing, but is decreased bilateral                Neurological: Pt is alert. At baseline orientation, motor grossly intact               Skin: Skin is warm. No rashes, no other new lesions               Psychiatric: Pt behavior is normal without agitation but moderately nervous  Micro: none  Cardiac tracings I have personally interpreted today:  none  Pertinent Radiological findings (summarize): none   Lab Results  Component Value Date   WBC 20.1 (H) 07/07/2022   HGB 10.4 (L) 07/07/2022   HCT 32.3 (L) 07/07/2022   PLT 252 07/07/2022   GLUCOSE 116 (H) 07/07/2022   CHOL 192 06/21/2022   TRIG 83.0 06/21/2022   HDL 60.10 06/21/2022   LDLCALC 116 (H) 06/21/2022   ALT 12 07/07/2022   AST 16 07/07/2022   NA 136 07/07/2022   K 4.7 07/07/2022   CL 103 07/07/2022   CREATININE 1.02 (H) 07/07/2022   BUN 12 07/07/2022   CO2 24 07/07/2022   TSH 1.13 06/21/2022   INR 0.93 12/03/2014   HGBA1C 5.9 06/21/2022   Assessment/Plan:  Chaya Dehaan Morgano is a 66 y.o. White or Caucasian [1] female with  has a past medical history of ADD (attention deficit disorder), Anxiety, Arthritis, Bronchitis, Cardiac murmur, Carpal tunnel syndrome, bilateral, Chronic pain, Depression, Eczema, Family history of adverse reaction to anesthesia, Fibromyalgia, Friction blisters of the palms, History of blood transfusion (08/2004), History of toxoplasmosis, Hyperlipemia, Lyme disease, Osteoarthritis, Osteopenia, and Sleep apnea.  Cough Pt with acute symptoms x 3 days, concerned with pna, but exam is c/w URI and cough, with possible mild bronchospasm   Today for cxr, also levaquin course asd, cough med prn, and prednisone taper  Generalized anxiety disorder With mild flare situational, pt reassured, cont same tx  Vitamin D deficiency Last vitamin D Lab Results   Component Value Date   VD25OH 43.59 06/21/2022   Stable, cont oral replacement  Followup: Return if symptoms worsen or fail to improve.  Cathlean Cower, MD 09/19/2022 12:33 PM [facility]

## 2022-09-19 ENCOUNTER — Encounter: Payer: Self-pay | Admitting: Internal Medicine

## 2022-09-19 DIAGNOSIS — R059 Cough, unspecified: Secondary | ICD-10-CM | POA: Insufficient documentation

## 2022-09-19 NOTE — Assessment & Plan Note (Signed)
With mild flare situational, pt reassured, cont same tx

## 2022-09-19 NOTE — Assessment & Plan Note (Signed)
Last vitamin D Lab Results  Component Value Date   VD25OH 43.59 06/21/2022   Stable, cont oral replacement

## 2022-09-19 NOTE — Assessment & Plan Note (Signed)
Pt with acute symptoms x 3 days, concerned with pna, but exam is c/w URI and cough, with possible mild bronchospasm   Today for cxr, also levaquin course asd, cough med prn, and prednisone taper

## 2022-09-23 ENCOUNTER — Telehealth: Payer: Self-pay | Admitting: Internal Medicine

## 2022-09-23 NOTE — Telephone Encounter (Signed)
Please advise 

## 2022-09-23 NOTE — Telephone Encounter (Signed)
Patient would like a prescription for pantoprazole sent in to [facility] on [address] - She said that tums are not working.

## 2022-09-24 ENCOUNTER — Other Ambulatory Visit: Payer: Self-pay | Admitting: Internal Medicine

## 2022-09-24 MED ORDER — PANTOPRAZOLE SODIUM 20 MG PO TBEC: 20.0000 mg | DELAYED_RELEASE_TABLET | Freq: Every day | ORAL | 0 refills | Status: AC

## 2022-09-24 NOTE — Telephone Encounter (Signed)
LVM- to pt regarding medication and instructions.

## 2022-09-24 NOTE — Telephone Encounter (Signed)
Have sent in, if no improvement in symptoms in 2 weeks needs visit.

## 2022-09-26 HISTORY — PX: TENDON REPAIR: SHX5111

## 2022-09-27 ENCOUNTER — Encounter: Payer: Self-pay | Admitting: Internal Medicine

## 2022-09-27 DIAGNOSIS — R7303 Prediabetes: Secondary | ICD-10-CM

## 2022-09-30 NOTE — Telephone Encounter (Signed)
Contacted pt and was able to schedule an telephone call for pt.

## 2022-10-04 NOTE — Telephone Encounter (Signed)
Contacted pt and was able to reschedule a my chart video call for pt.

## 2022-10-05 ENCOUNTER — Telehealth (INDEPENDENT_AMBULATORY_CARE_PROVIDER_SITE_OTHER): Payer: Medicare Other | Admitting: Internal Medicine

## 2022-10-05 DIAGNOSIS — F411 Generalized anxiety disorder: Secondary | ICD-10-CM | POA: Diagnosis not present

## 2022-10-05 DIAGNOSIS — F11282 Opioid dependence with opioid-induced sleep disorder: Secondary | ICD-10-CM

## 2022-10-05 NOTE — Progress Notes (Signed)
Virtual Visit via Video Note  I connected with Latana Colin Balgobin on 10/05/22 at  3:20 PM EDT by a video enabled telemedicine application and verified that I am speaking with the correct person using two identifiers.  The patient and the provider were at separate locations throughout the entire encounter. Patient location: home, Provider location: work   I discussed the limitations of evaluation and management by telemedicine and the availability of in person appointments. The patient expressed understanding and agreed to proceed. The patient and the provider were the only parties present for the visit unless noted in HPI below.  History of Present Illness: The patient is a 66 y.o. female with visit for questions about labs.   Observations/Objective: Appearance: normal, breathing appears normal, casual grooming, mental status is A and O times 3  Assessment and Plan: See problem oriented charting  Follow Up Instructions: explained labs to patient  Visit time 15 minutes in face to face communication with patient and coordination of care, additional 5 minutes spent in record review, coordination or care, ordering tests, communicating/referring to other healthcare professionals, documenting in medical records all on the same day of the visit for total time 20 minutes spent on the visit.    I discussed the assessment and treatment plan with the patient. The patient was provided an opportunity to ask questions and all were answered. The patient agreed with the plan and demonstrated an understanding of the instructions.   The patient was advised to call back or seek an in-person evaluation if the symptoms worsen or if the condition fails to improve as anticipated.  Hoyt Koch, MD

## 2022-10-08 ENCOUNTER — Encounter: Payer: Self-pay | Admitting: Internal Medicine

## 2022-10-08 NOTE — Assessment & Plan Note (Signed)
She has tried to cut back on ativan since our discussion recently. Advised she would be safer without this at all. Counseled to continue working with her mental health provider on this.

## 2022-10-08 NOTE — Assessment & Plan Note (Signed)
She has not talked to her pain management specialist as advised and has not tried to reduce opioids given her recent episodes of being unarousable. Counseled again to do this.

## 2022-10-14 ENCOUNTER — Emergency Department (HOSPITAL_COMMUNITY)
Admission: EM | Admit: 2022-10-14 | Discharge: 2022-10-14 | Discharge status: Against Medical Advice | Payer: Medicare Other

## 2022-10-14 ENCOUNTER — Emergency Department (HOSPITAL_COMMUNITY)
Admission: EM | Admit: 2022-10-14 | Discharge: 2022-10-14 | Disposition: A | Discharge status: Home / Self Care | Payer: Medicare Other | Attending: Emergency Medicine | Admitting: Emergency Medicine

## 2022-10-14 ENCOUNTER — Other Ambulatory Visit: Payer: Self-pay

## 2022-10-14 ENCOUNTER — Encounter (HOSPITAL_COMMUNITY): Payer: Self-pay

## 2022-10-14 ENCOUNTER — Emergency Department (HOSPITAL_COMMUNITY): Payer: Medicare Other

## 2022-10-14 ENCOUNTER — Telehealth: Payer: Self-pay

## 2022-10-14 DIAGNOSIS — R61 Generalized hyperhidrosis: Secondary | ICD-10-CM | POA: Diagnosis not present

## 2022-10-14 DIAGNOSIS — R739 Hyperglycemia, unspecified: Secondary | ICD-10-CM | POA: Insufficient documentation

## 2022-10-14 DIAGNOSIS — R1012 Left upper quadrant pain: Secondary | ICD-10-CM | POA: Insufficient documentation

## 2022-10-14 DIAGNOSIS — R0789 Other chest pain: Secondary | ICD-10-CM | POA: Diagnosis present

## 2022-10-14 DIAGNOSIS — R110 Nausea: Secondary | ICD-10-CM | POA: Insufficient documentation

## 2022-10-14 LAB — CBC
HCT: 36.8 % (ref 36.0–46.0)
Hemoglobin: 11.9 g/dL — ABNORMAL LOW (ref 12.0–15.0)
MCH: 28.1 pg (ref 26.0–34.0)
MCHC: 32.3 g/dL (ref 30.0–36.0)
MCV: 87 fL (ref 80.0–100.0)
Platelets: 227 10*3/uL (ref 150–400)
RBC: 4.23 MIL/uL (ref 3.87–5.11)
RDW: 14.6 % (ref 11.5–15.5)
WBC: 10.4 10*3/uL (ref 4.0–10.5)
nRBC: 0 % (ref 0.0–0.2)

## 2022-10-14 LAB — TROPONIN I (HIGH SENSITIVITY)
Troponin I (High Sensitivity): 6 ng/L (ref <18)
Troponin I (High Sensitivity): 6 ng/L (ref <18)

## 2022-10-14 LAB — HEPATIC FUNCTION PANEL
ALT: 14 U/L (ref 0–44)
AST: 22 U/L (ref 15–41)
Albumin: 3.7 g/dL (ref 3.5–5.0)
Alkaline Phosphatase: 64 U/L (ref 38–126)
Bilirubin, Direct: 0.3 mg/dL — ABNORMAL HIGH (ref 0.0–0.2)
Indirect Bilirubin: 0.8 mg/dL (ref 0.3–0.9)
Total Bilirubin: 1.1 mg/dL (ref 0.3–1.2)
Total Protein: 7 g/dL (ref 6.5–8.1)

## 2022-10-14 LAB — BASIC METABOLIC PANEL
Anion gap: 9 (ref 5–15)
BUN: 18 mg/dL (ref 8–23)
CO2: 25 mmol/L (ref 22–32)
Calcium: 9 mg/dL (ref 8.9–10.3)
Chloride: 102 mmol/L (ref 98–111)
Creatinine, Ser: 1 mg/dL (ref 0.44–1.00)
GFR, Estimated: >60 mL/min (ref >60)
Glucose, Bld: 117 mg/dL — ABNORMAL HIGH (ref 70–99)
Potassium: 4 mmol/L (ref 3.5–5.1)
Sodium: 136 mmol/L (ref 135–145)

## 2022-10-14 LAB — LIPASE, BLOOD: Lipase: 23 U/L (ref 11–51)

## 2022-10-14 LAB — D-DIMER, QUANTITATIVE: D-Dimer, Quant: 0.77 ug/mL-FEU — ABNORMAL HIGH (ref 0.00–0.50)

## 2022-10-14 MED ORDER — SODIUM CHLORIDE 0.9 % IV BOLUS
1000.0000 mL | Freq: Once | INTRAVENOUS | Status: AC
  Administered 2022-10-14: 1000 mL via INTRAVENOUS

## 2022-10-14 MED ORDER — CEPHALEXIN 500 MG PO CAPS: 500.0000 mg | ORAL_CAPSULE | Freq: Three times a day (TID) | ORAL | 0 refills | Status: DC

## 2022-10-14 MED ORDER — ONDANSETRON 4 MG PO TBDP: 4.0000 mg | ORAL_TABLET | Freq: Three times a day (TID) | ORAL | 0 refills | Status: DC | PRN

## 2022-10-14 MED ORDER — IOHEXOL 350 MG/ML SOLN
100.0000 mL | Freq: Once | INTRAVENOUS | Status: AC | PRN
  Administered 2022-10-14: 100 mL via INTRAVENOUS

## 2022-10-14 MED ORDER — LIDOCAINE VISCOUS HCL 2 % MT SOLN
15.0000 mL | Freq: Once | OROMUCOSAL | Status: AC
  Administered 2022-10-14: 15 mL via ORAL
  Filled 2022-10-14: qty 15

## 2022-10-14 MED ORDER — LIDOCAINE VISCOUS HCL 2 % MT SOLN: 10.0000 mL | Freq: Four times a day (QID) | OROMUCOSAL | 0 refills | Status: DC | PRN

## 2022-10-14 MED ORDER — ONDANSETRON HCL 4 MG/2ML IJ SOLN
4.0000 mg | Freq: Once | INTRAMUSCULAR | Status: AC
  Administered 2022-10-14: 4 mg via INTRAVENOUS
  Filled 2022-10-14: qty 2

## 2022-10-14 MED ORDER — ALUM & MAG HYDROXIDE-SIMETH 200-200-20 MG/5ML PO SUSP
30.0000 mL | Freq: Once | ORAL | Status: AC
  Administered 2022-10-14: 30 mL via ORAL
  Filled 2022-10-14: qty 30

## 2022-10-14 MED ORDER — SODIUM CHLORIDE (PF) 0.9 % IJ SOLN
INTRAMUSCULAR | Status: AC
  Filled 2022-10-14: qty 50

## 2022-10-14 NOTE — ED Notes (Signed)
Pt states that they are leaving, refusing to go to triage.

## 2022-10-14 NOTE — Discharge Instructions (Signed)
During the workup we noted incidental findings on your imaging that would require you to follow-up with your regular doctor for further evaluation/management: 1. 11 mm medial left upper lobe pulmonary nodule. Consider one of  the following in 3 months for both low-risk and high-risk  individuals: (a) repeat chest CT, (b) follow-up PET-CT, or (c)  tissue sampling. This recommendation follows the consensus  statement: Guidelines for Management of Incidental Pulmonary Nodules  Detected on CT Images: From the Fleischner Society 2017; Radiology  2017; 284:228-243.  2. Mildly enlarged right paratracheal lymph node may be reactive,  but close follow-up recommended to exclude metastatic disease.

## 2022-10-14 NOTE — ED Triage Notes (Signed)
Pt reports chest pain radiating to "between shoulder blades" x 2 hours. Also c/o nausea and indigestion that was not relieved with medication.

## 2022-10-14 NOTE — ED Provider Notes (Signed)
[place] [facility] DEPT Provider Note  CSN: [id number] Arrival date & time: 10/14/22 0100  Chief Complaint(s) Chest Pain  HPI Abigail Huerta is a 66 y.o. female with a past medical history listed below including rheumatoid arthritis who presents to the emergency department for substernal chest pain radiating to the back.  Patient reports having hot liquid up to the back of the throat.  States that it felt like really bad indigestion.  She tried taking over-the-counter antacids with no relief.  Patient also took a nitroglycerin from her son which provided no relief either.  She denies any associated shortness of breath.  She does endorse some nausea without emesis, diaphoresis.  She also had left upper quadrant abdominal discomfort.  No other physical complaints.  The history is provided by the patient.    Past Medical History Past Medical History:  Diagnosis Date   ADD (attention deficit disorder)    Anxiety    Arthritis    Bronchitis    12-23-15 being seen today for bronchitis, ? 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   Hyperlipemia    Lyme disease    Osteoarthritis    Osteopenia    Sleep apnea    cpap setting at 9   Patient Active Problem List   Diagnosis Date Noted   Cough 09/19/2022   Memory change 09/10/2022   Opioid dependence ([place]) 09/10/2022   Community acquired pneumonia 07/22/2022   Fibromyalgia 07/19/2022   Psoriatic arthritis ([place]) 07/19/2022   Pustulosis palmaris et plantaris 07/19/2022   Cervical radiculopathy 06/29/2022   Impaired fasting blood sugar 06/24/2022    Other fatigue 06/24/2022   Vitamin D deficiency 06/24/2022   Polypharmacy 06/24/2022   Therapeutic opioid-induced constipation (OIC) 06/24/2022   Lumbar spondylosis 06/04/2020   S/P right TH revision 01/02/2019   Mild episode of recurrent major depressive disorder ([place]) 10/02/2018   Vitamin B12 deficiency 04/26/2018   Pain of right hip joint 03/20/2018   Degeneration of lumbar intervertebral disc 03/02/2018   Long-term current use of opiate analgesic 01/09/2018   Eczema of both hands 12/23/2016   Generalized anxiety disorder 06/30/2016   Opacity of lung on imaging study 06/03/2016   Chronic bronchitis ([place]) 05/04/2016   Allergic rhinitis 05/04/2016   GERD (gastroesophageal reflux disease) 05/04/2016   Arthritis 05/04/2016   OSA on CPAP 05/04/2016   Attention deficit hyperactivity disorder (ADHD), predominantly inattentive type 04/22/2016   Depression 04/22/2016   S/P lumbar spinal fusion 12/26/2014   S/P left THA, AA 04/18/2013   Expected blood loss anemia 04/18/2013   Obese 04/18/2013   Bariatric surgery status 01/22/2004   Home Medication(s) Prior to Admission medications   Medication Sig Start Date End Date Taking? Authorizing Provider  lidocaine (XYLOCAINE) 2 % solution Use as directed 10 mLs in the mouth or throat every 6 (six) hours as needed (stomach pain). 10/14/22  Yes Meara Wiechman, Grayce Sessions, MD  albuterol (VENTOLIN HFA) 108 (90 Base) MCG/ACT inhaler Inhale 2 puffs into the lungs every 6 (six) hours as needed  for shortness of breath. 01/07/22   Spero Geralds, MD  ARIPiprazole (ABILIFY) 2 MG tablet Take by mouth. 09/30/22   [provider]  aspirin EC 81 MG tablet Take 81 mg by mouth daily.    [provider]  buPROPion (WELLBUTRIN XL) 150 MG 24 hr tablet Take 150 mg by mouth daily. Total daily dose=450 mg    [provider]  buPROPion (WELLBUTRIN XL) 300 MG 24 hr tablet Take 300 mg by mouth daily. Total daily dose=450 mg    [provider]  escitalopram (LEXAPRO) 20 MG tablet Take 20 mg by mouth daily.  04/22/16   [provider]  fentaNYL (DURAGESIC - DOSED MCG/HR) 100 MCG/HR Place 100 mcg onto the skin every other day.     [provider]  fluticasone (FLOVENT HFA) 110 MCG/ACT inhaler Inhale 1 puff into the lungs in the morning and at bedtime. 01/07/22   Spero Geralds, MD  gabapentin (NEURONTIN) 300 MG capsule Take 300 mg by mouth 3 (three) times daily. 07/11/22   [provider]  leucovorin (WELLCOVORIN) 5 MG tablet Take 5 mg by mouth 2 (two) times a week. 06/07/22   [provider]  LORazepam (ATIVAN) 1 MG tablet Take 2 mg by mouth at bedtime.     [provider]  methotrexate (RHEUMATREX) 2.5 MG tablet Take 15 mg by mouth once a week. 5 times weekly 05/16/22   [provider]  methylphenidate (RITALIN) 20 MG tablet Take 20 mg by mouth 2 (two) times daily.  04/22/16   [provider]  oxyCODONE-acetaminophen (PERCOCET) 10-325 MG tablet Take 1-2 tablets by mouth every 6 (six) hours as needed for pain. 01/03/19   Danae Orleans, PA-C  pantoprazole (PROTONIX) 20 MG tablet Take 1 tablet (20 mg total) by mouth daily. 09/24/22   Hoyt Koch, MD  pantoprazole (PROTONIX) 40 MG tablet Take 40 mg by mouth 2 (two) times daily. 09/29/22   [provider]  predniSONE (DELTASONE) 10 MG tablet 3 tabs by mouth per day for 3 days,2tabs per day for 3 days,1tab per day for 3 days 09/17/22   Biagio Borg, MD  promethazine (PHENERGAN) 25 MG tablet Take 25 mg by mouth every 6 (six) hours as needed for nausea.    [provider]  Spacer/Aero-Holding Josiah Lobo DEVI To use with MDI 01/07/22   Spero Geralds, MD  ferrous sulfate (FERROUSUL) 325 (65 FE) MG tablet Take 1 tablet (325 mg total) by mouth 3 (three) times daily with meals. 01/02/19 03/07/20  Danae Orleans, PA-C                                                                                                                                     Allergies Morphine, Pollen extract, and Sulfa antibiotics  Review of Systems Review of Systems As noted in HPI  Physical Exam Vital Signs  I have reviewed  the triage vital signs BP 132/73   Pulse 72   Temp 97.7 F (36.5 C) (Oral)   Resp 11   Ht '5\' 7"'$  (1.702 m)   Wt 97.5 kg   SpO2 92%   BMI 33.67 kg/m   Physical Exam Vitals reviewed.  Constitutional:      General: She is not in acute distress.    Appearance: She is well-developed. She is not diaphoretic.  HENT:     Head: Normocephalic and atraumatic.     Nose: Nose normal.  Eyes:     General: No scleral icterus.       Right eye: No discharge.        Left eye: No discharge.     Conjunctiva/sclera: Conjunctivae normal.     Pupils: Pupils are equal, round, and reactive to light.  Cardiovascular:     Rate and Rhythm: Normal rate and regular rhythm.     Heart sounds: No murmur heard.    No friction rub. No gallop.  Pulmonary:     Effort: Pulmonary effort is normal. No respiratory distress.     Breath sounds: Normal breath sounds. No stridor. No rales.  Abdominal:     General: There is no distension.     Palpations: Abdomen is soft.     Tenderness: There is abdominal tenderness (mild) in the left upper quadrant.  Musculoskeletal:        General: No tenderness.     Cervical back: Normal range of motion and neck supple.  Skin:    General: Skin is warm and dry.     Findings: No erythema or rash.  Neurological:     Mental Status: She is alert and oriented to person, place, and time.     ED Results and Treatments Labs (all labs ordered are listed, but only abnormal results are displayed) Labs Reviewed  BASIC METABOLIC PANEL - Abnormal; Notable for the following components:      Result Value   Glucose, Bld 117 (*)    All other components within normal limits  CBC - Abnormal; Notable for the following components:   Hemoglobin 11.9 (*)    All other components within normal limits   HEPATIC FUNCTION PANEL - Abnormal; Notable for the following components:   Bilirubin, Direct 0.3 (*)    All other components within normal limits  D-DIMER, QUANTITATIVE (NOT AT [facility]) - Abnormal; Notable for the following components:   D-Dimer, Quant 0.77 (*)    All other components within normal limits  LIPASE, BLOOD  TROPONIN I (HIGH SENSITIVITY)  TROPONIN I (HIGH SENSITIVITY)                                                                                                                         EKG  EKG Interpretation  Date/Time:  Thursday October 14 2022 01:13:33 EDT Ventricular Rate:  76 PR Interval:  138 QRS Duration: 116 QT Interval:  393 QTC Calculation: 442 R Axis:   -7 Text  Interpretation: Sinus rhythm Incomplete right bundle branch block Low voltage, precordial leads No significant change was found Confirmed by Addison Lank [phone]) on 10/14/2022 2:10:27 AM       Radiology CT Angio Chest/Abd/Pel for Dissection W and/or Wo Contrast  Result Date: 10/14/2022 CLINICAL DATA:  Chest pain radiates to the back between the shoulder blades. Elevated D-dimer. Clinical concern for aortic dissection. EXAM: CT ANGIOGRAPHY CHEST, ABDOMEN AND PELVIS TECHNIQUE: Non-contrast CT of the chest was initially obtained. Multidetector CT imaging through the chest, abdomen and pelvis was performed using the standard protocol during bolus administration of intravenous contrast. Multiplanar reconstructed images and MIPs were obtained and reviewed to evaluate the vascular anatomy. RADIATION DOSE REDUCTION: This exam was performed according to the departmental dose-optimization program which includes automated exposure control, adjustment of the mA and/or kV according to patient size and/or use of iterative reconstruction technique. CONTRAST:  142m OMNIPAQUE IOHEXOL 350 MG/ML SOLN COMPARISON:  Abdomen/pelvis CT 04/19/2021 FINDINGS: CTA CHEST FINDINGS Cardiovascular: Pre contrast imaging shows no  hyperdense crescent in the wall of the thoracic aorta to suggest the presence of an acute intramural hematoma. Postcontrast imaging reveals no thoracic aortic aneurysm. No dissection of the thoracic aorta. Aortic arch branch vessel anatomy is widely patent. The heart size is normal. No substantial pericardial effusion. Mild atherosclerotic calcification is noted in the wall of the thoracic aorta. Mediastinum/Nodes: Scattered small mediastinal lymph nodes are identified with 1 particular right paratracheal node measuring 13 mm short axis (27/7) mildly enlarged. There is no hilar lymphadenopathy. Small hiatal hernia. The esophagus has normal imaging features. There is no axillary lymphadenopathy. Lungs/Pleura: Diffuse interstitial prominence noted with subtle central predominant ill-defined ground-glass nodularity in both lungs, involving the lower lungs to a greater degree than the upper lungs. 11 mm medial left upper lobe pulmonary nodule identified on image 57 of series 4. No pleural effusion. Musculoskeletal: No worrisome lytic or sclerotic osseous abnormality. Review of the MIP images confirms the above findings. CTA ABDOMEN AND PELVIS FINDINGS VASCULAR Aorta: Normal caliber aorta without aneurysm, dissection, vasculitis or significant stenosis. Celiac: Patent without evidence of aneurysm, dissection, vasculitis or significant stenosis. SMA: Patent without evidence of aneurysm, dissection, vasculitis or significant stenosis. Renals: Both renal arteries are patent without evidence of aneurysm, dissection, vasculitis, fibromuscular dysplasia or significant stenosis. IMA: Patent without evidence of aneurysm, dissection, vasculitis or significant stenosis. Inflow: Patent without evidence of aneurysm, dissection, vasculitis or significant stenosis. Veins: No obvious venous abnormality within the limitations of this arterial phase study. Review of the MIP images confirms the above findings. NON-VASCULAR Hepatobiliary:  No suspicious focal abnormality within the liver parenchyma. There is no evidence for gallstones, gallbladder wall thickening, or pericholecystic fluid. No intrahepatic or extrahepatic biliary dilation. Pancreas: No focal mass lesion. No dilatation of the main duct. No intraparenchymal cyst. No peripancreatic edema. Spleen: No splenomegaly. No focal mass lesion. Adrenals/Urinary Tract: No adrenal nodule or mass. Kidneys unremarkable. No evidence for hydroureter. Bladder is obscured by beam hardening artifact from bilateral hip replacement. Stomach/Bowel: Small hiatal hernia. Stomach decompressed. Duodenum is normally positioned as is the ligament of Treitz. No small bowel wall thickening. No small bowel dilatation. The terminal ileum is normal. The appendix is not well visualized, but there is no edema or inflammation in the region of the cecum. No gross colonic mass. No colonic wall thickening. Parts of the sigmoid colon are obscured by beam hardening artifact. Lymphatic: There is no gastrohepatic or hepatoduodenal ligament lymphadenopathy. No retroperitoneal or mesenteric lymphadenopathy. No pelvic sidewall  lymphadenopathy. Reproductive: Central pelvis obscured by beam hardening artifact. Other: No intraperitoneal free fluid. Musculoskeletal: Status post bilateral hip replacement. Status post L4-5 fusion. No worrisome lytic or sclerotic osseous abnormality. Review of the MIP images confirms the above findings. IMPRESSION: 1. No evidence for thoracic or abdominal aortic aneurysm or dissection. 2. Diffuse interstitial prominence with subtle central predominant ill-defined ground-glass nodularity in both lungs, involving the lower lungs to a greater degree than the upper lungs. Imaging features can be seen in the setting of viral pneumonia, though are nonspecific and can occur with a variety of infectious and noninfectious processes. 3. 11 mm medial left upper lobe pulmonary nodule. Consider one of the following in 3  months for both low-risk and high-risk individuals: (a) repeat chest CT, (b) follow-up PET-CT, or (c) tissue sampling. This recommendation follows the consensus statement: Guidelines for Management of Incidental Pulmonary Nodules Detected on CT Images: From the Fleischner Society 2017; Radiology 2017; 284:228-243. 4. Mildly enlarged right paratracheal lymph node may be reactive, but close follow-up recommended to exclude metastatic disease. 5. Small hiatal hernia. 6. No acute findings in the abdomen or pelvis. Electronically Signed   By: Misty Stanley M.D.   On: 10/14/2022 06:01   DG Chest 2 View  Result Date: 10/14/2022 CLINICAL DATA:  Chest pain EXAM: CHEST - 2 VIEW COMPARISON:  Radiographs 09/17/2022 FINDINGS: No focal consolidation, pleural effusion, or pneumothorax. Decreased peribronchial thickening. Normal cardiomediastinal silhouette. No acute osseous abnormality. IMPRESSION: No active cardiopulmonary disease. Electronically Signed   By: Placido Sou M.D.   On: 10/14/2022 01:40    Medications Ordered in ED Medications  sodium chloride (PF) 0.9 % injection (has no administration in time range)  alum & mag hydroxide-simeth (MAALOX/MYLANTA) 200-200-20 MG/5ML suspension 30 mL (30 mLs Oral Given 10/14/22 0245)    And  lidocaine (XYLOCAINE) 2 % viscous mouth solution 15 mL (15 mLs Oral Given 10/14/22 0245)  sodium chloride 0.9 % bolus 1,000 mL (0 mLs Intravenous Stopped 10/14/22 0509)  ondansetron (ZOFRAN) injection 4 mg (4 mg Intravenous Given 10/14/22 0244)  iohexol (OMNIPAQUE) 350 MG/ML injection 100 mL (100 mLs Intravenous Contrast Given 10/14/22 0528)                                                                                                                                     Procedures Procedures  (including critical care time)  Medical Decision Making / ED Course   Medical Decision Making Amount and/or Complexity of Data Reviewed Labs: ordered. Decision-making details  documented in ED Course. Radiology: ordered and independent interpretation performed. Decision-making details documented in ED Course. ECG/medicine tests: ordered and independent interpretation performed. Decision-making details documented in ED Course.  Risk OTC drugs. Prescription drug management.     Complexity of Problem: Patient's presenting problem/concern, DDX, and MDM listed below: Atypical chest pain Most suspicious for esophagitis/GERD Will assess for ACS, PE, dissection.     Complexity of  Data:   Cardiac Monitoring: Sinus rhythm with rates in the 70s EKG without acute ischemic changes or evidence of pericarditis  Laboratory Tests ordered listed below with my independent interpretation: CBC without leukocytosis or severe anemia. Metabolic panel without significant electrolyte derangement or renal sufficiency.  Mild hyperglycemia without DKA. No evidence of bili obstruction or pancreatitis Serial troponins negative x2   Imaging Studies ordered listed below with my independent interpretation: Chest x-ray without evidence of pneumonia, pneumothorax, pulmonary edema or pleural effusions. CTA negative for large PE, dissection.  She does have incidental findings noted above.  Patient made aware.     ED Course:    Assessment, Add'l Intervention, and Reassessment: Chest discomfort Completely resolved after GI cocktail favoring esophagitis/GERD. No recurrence of her pain after 5 hours. Rest of work up reassuring, doubt other serious etiologies noted above.      Final Clinical Impression(s) / ED Diagnoses Final diagnoses:  Atypical chest pain   The patient appears reasonably screened and/or stabilized for discharge and I doubt any other medical condition or other [facility] requiring further screening, evaluation, or treatment in the ED at this time. I have discussed the findings, Dx and Tx plan with the patient/family who expressed understanding and agree(s) with the plan.  Discharge instructions discussed at length. The patient/family was given strict return precautions who verbalized understanding of the instructions. No further questions at time of discharge.  Disposition: Discharge  Condition: Good  ED Discharge Orders          Ordered    lidocaine (XYLOCAINE) 2 % solution  Every 6 hours PRN        10/14/22 4696             Follow Up: Hoyt Koch, [place] [address] [phone]  Call  to schedule an appointment for close follow up  Hoyt Koch, [place] [address] [phone]  Call  to schedule an appointment for close follow up           This chart was dictated using voice recognition software.  Despite best efforts to proofread,  errors can occur which can change the documentation meaning.    Fatima Blank, MD 10/14/22 [phone]

## 2022-10-14 NOTE — Telephone Encounter (Signed)
Contacted pt for hospital follow up. No answer so I left a voicemail.

## 2022-10-19 ENCOUNTER — Telehealth: Payer: Self-pay

## 2022-10-19 NOTE — Telephone Encounter (Signed)
Called pt regarding F/U appointment after ED visit. Left vmail, first attempt

## 2022-11-03 ENCOUNTER — Telehealth: Payer: Self-pay

## 2022-11-05 NOTE — Telephone Encounter (Signed)
Contacted patient for hospital follow up

## 2022-12-09 ENCOUNTER — Ambulatory Visit: Payer: Medicare Other

## 2023-01-11 ENCOUNTER — Ambulatory Visit: Payer: Medicare Other | Admitting: Internal Medicine

## 2023-01-18 ENCOUNTER — Ambulatory Visit (INDEPENDENT_AMBULATORY_CARE_PROVIDER_SITE_OTHER): Payer: Medicare Other | Admitting: Internal Medicine

## 2023-01-18 ENCOUNTER — Encounter: Payer: Self-pay | Admitting: Internal Medicine

## 2023-01-18 VITALS — BP 118/78 | HR 97 | Temp 98.3°F | Ht 67.0 in | Wt 218.6 lb

## 2023-01-18 DIAGNOSIS — R7303 Prediabetes: Secondary | ICD-10-CM | POA: Diagnosis not present

## 2023-01-18 DIAGNOSIS — D229 Melanocytic nevi, unspecified: Secondary | ICD-10-CM

## 2023-01-18 MED ORDER — ONETOUCH ULTRA 2 W/DEVICE KIT: PACK | 0 refills | Status: AC

## 2023-01-18 NOTE — Assessment & Plan Note (Signed)
Sugar meter rx done today. Offered HGA1c testing but she declines today would prefer to wait until next visit. Husband with type 2 diabetes so she has done nutrition education with him recently

## 2023-01-18 NOTE — Patient Instructions (Addendum)
We have sent in sugar meter. You can check the sugar in the morning before eating. Normal is <100, pre-diabetes is 100-125, >126 is worrying for diabetes.   You can try claritin or zyrtec or allegra or xyzal for the congestion daily.

## 2023-01-18 NOTE — Progress Notes (Signed)
   Subjective:   Patient ID: Abigail Huerta, female    DOB: 30-Apr-1956, 67 y.o.   MRN: [id number]  HPI The patient is a 67 YO female coming in for mole and other concerns.  Review of Systems  Constitutional: Negative.   HENT: Negative.    Eyes: Negative.   Respiratory:  Negative for cough, chest tightness and shortness of breath.   Cardiovascular:  Negative for chest pain, palpitations and leg swelling.  Gastrointestinal:  Negative for abdominal distention, abdominal pain, constipation, diarrhea, nausea and vomiting.  Musculoskeletal: Negative.   Skin:  Positive for color change.  Neurological: Negative.   Psychiatric/Behavioral: Negative.      Objective:  Physical Exam Constitutional:      Appearance: She is well-developed.  HENT:     Head: Normocephalic and atraumatic.  Cardiovascular:     Rate and Rhythm: Normal rate and regular rhythm.  Pulmonary:     Effort: Pulmonary effort is normal. No respiratory distress.     Breath sounds: Normal breath sounds. No wheezing or rales.  Abdominal:     General: Bowel sounds are normal. There is no distension.     Palpations: Abdomen is soft.     Tenderness: There is no abdominal tenderness. There is no rebound.  Musculoskeletal:     Cervical back: Normal range of motion.  Skin:    General: Skin is warm and dry.     Comments: 2 actinic keratosis on right arm no concerning moles detected  Neurological:     Mental Status: She is alert and oriented to person, place, and time.     Coordination: Coordination normal.     Vitals:   01/18/23 1443  BP: 118/78  Pulse: 97  Temp: 98.3 F (36.8 C)  TempSrc: Oral  SpO2: 98%  Weight: 218 lb 9.6 oz (99.2 kg)  Height: '5\' 7"'$  (1.702 m)    Assessment & Plan:

## 2023-01-18 NOTE — Assessment & Plan Note (Signed)
Two actinic keratosis on the right arm, reassurance given to monitor for color/size change. Use sun protection consistently to avoid further sun exposure.

## 2023-01-19 ENCOUNTER — Other Ambulatory Visit: Payer: Self-pay

## 2023-01-19 DIAGNOSIS — R7303 Prediabetes: Secondary | ICD-10-CM

## 2023-01-19 MED ORDER — BLOOD GLUCOSE MONITOR KIT: PACK | 0 refills | Status: DC

## 2023-01-19 MED ORDER — BLOOD GLUCOSE MONITOR KIT: PACK | 0 refills | Status: AC

## 2023-01-19 NOTE — Addendum Note (Signed)
Addended by: Macie Burows on: 01/19/2023 10:15 AM   Modules accepted: Orders

## 2023-01-19 NOTE — Addendum Note (Signed)
Addended by: Macie Burows on: 01/19/2023 09:54 AM   Modules accepted: Orders

## 2023-01-27 ENCOUNTER — Other Ambulatory Visit (HOSPITAL_BASED_OUTPATIENT_CLINIC_OR_DEPARTMENT_OTHER): Payer: Self-pay

## 2023-01-27 ENCOUNTER — Other Ambulatory Visit (HOSPITAL_COMMUNITY): Payer: Self-pay

## 2023-01-27 MED ORDER — FENTANYL 100 MCG/HR TD PT72
1.0000 | MEDICATED_PATCH | TRANSDERMAL | 0 refills | Status: DC
  Filled 2023-01-27 – 2023-01-28 (×7): qty 15, 30d supply, fill #0

## 2023-01-28 ENCOUNTER — Other Ambulatory Visit (HOSPITAL_COMMUNITY): Payer: Self-pay

## 2023-01-31 ENCOUNTER — Other Ambulatory Visit (HOSPITAL_COMMUNITY): Payer: Self-pay

## 2023-01-31 MED ORDER — FENTANYL 100 MCG/HR TD PT72: 1.0000 | MEDICATED_PATCH | TRANSDERMAL | 0 refills | Status: DC

## 2023-02-22 ENCOUNTER — Encounter: Payer: Self-pay | Admitting: Internal Medicine

## 2023-02-24 ENCOUNTER — Other Ambulatory Visit (HOSPITAL_COMMUNITY): Payer: Self-pay

## 2023-02-25 ENCOUNTER — Other Ambulatory Visit (HOSPITAL_COMMUNITY): Payer: Self-pay

## 2023-02-25 MED ORDER — OXYCODONE-ACETAMINOPHEN 10-325 MG PO TABS
1.0000 | ORAL_TABLET | Freq: Every day | ORAL | 0 refills | Status: AC
  Filled 2023-02-25: qty 120, 24d supply, fill #0

## 2023-02-25 MED ORDER — FENTANYL 100 MCG/HR TD PT72
1.0000 | MEDICATED_PATCH | TRANSDERMAL | 0 refills | Status: AC
  Filled 2023-02-25: qty 10, 20d supply, fill #0
  Filled 2023-02-25: qty 5, 10d supply, fill #0

## 2023-06-15 ENCOUNTER — Encounter: Payer: Self-pay | Admitting: Internal Medicine

## 2023-09-08 ENCOUNTER — Ambulatory Visit (INDEPENDENT_AMBULATORY_CARE_PROVIDER_SITE_OTHER): Payer: Medicare Other

## 2023-09-08 ENCOUNTER — Telehealth: Payer: Self-pay

## 2023-09-08 VITALS — Ht 67.0 in | Wt 196.0 lb

## 2023-09-08 DIAGNOSIS — Z1211 Encounter for screening for malignant neoplasm of colon: Secondary | ICD-10-CM

## 2023-09-08 DIAGNOSIS — Z1212 Encounter for screening for malignant neoplasm of rectum: Secondary | ICD-10-CM

## 2023-09-08 DIAGNOSIS — Z1231 Encounter for screening mammogram for malignant neoplasm of breast: Secondary | ICD-10-CM | POA: Diagnosis not present

## 2023-09-08 DIAGNOSIS — Z0000 Encounter for general adult medical examination without abnormal findings: Secondary | ICD-10-CM

## 2023-09-08 DIAGNOSIS — Z780 Asymptomatic menopausal state: Secondary | ICD-10-CM | POA: Diagnosis not present

## 2023-09-08 DIAGNOSIS — E2839 Other primary ovarian failure: Secondary | ICD-10-CM

## 2023-09-08 NOTE — Progress Notes (Signed)
Subjective:   Abigail Huerta is a 67 y.o. female who presents for an Initial Medicare Annual Wellness Visit.  Visit Complete: Virtual  I connected with  Maxwell B Azer on 09/08/23 by a audio enabled telemedicine application and verified that I am speaking with the correct person using two identifiers.  Patient Location: Home  Provider Location: Office/Clinic  I discussed the limitations of evaluation and management by telemedicine. The patient expressed understanding and agreed to proceed.  Vital Signs: Because this visit was a virtual/telehealth visit, some criteria may be missing or patient reported. Any vitals not documented were not able to be obtained and vitals that have been documented are patient reported.    Review of Systems   Cardiac Risk Factors include: advanced age (>61men, >20 women);Other (see comment);obesity (BMI >30kg/m2), Risk factor comments: OSA, Fibromyalgia     Objective:    Today's Vitals   09/08/23 1502 09/08/23 1519  Weight: 196 lb (88.9 kg)   Height: 5\' 7"  (1.702 m)   PainSc:  6    Body mass index is 30.7 kg/m.     09/08/2023    3:35 PM 10/14/2022    1:22 AM 07/07/2022    4:35 PM 08/24/2021   11:56 AM 04/19/2021    8:56 AM 02/05/2020    6:48 PM 01/02/2019    1:10 PM  Advanced Directives  Does Patient Have a Medical Advance Directive? Yes No No No No No No  Type of Estate agent of [place];Living will        Copy of Healthcare Power of Attorney in Chart? No - copy requested        Would patient like information on creating a medical advance directive?   No - Patient declined No - Patient declined No - Patient declined  No - Patient declined    Current Medications (verified) Outpatient Encounter Medications as of 09/08/2023  Medication Sig   albuterol (VENTOLIN HFA) 108 (90 Base) MCG/ACT inhaler Inhale 2 puffs into the lungs every 6 (six) hours as needed for shortness of breath.   ARIPiprazole (ABILIFY) 2 MG tablet Take by  mouth.   aspirin EC 81 MG tablet Take 81 mg by mouth daily.   blood glucose meter kit and supplies KIT Accu-Chek Guide Diabetic Starter Kit   Blood Glucose Monitoring Suppl (ONE TOUCH ULTRA 2) w/Device KIT Use daily to monitor sugars, pre-diabetes   buPROPion (WELLBUTRIN XL) 150 MG 24 hr tablet Take 150 mg by mouth daily. Total daily dose=450 mg   buPROPion (WELLBUTRIN XL) 300 MG 24 hr tablet Take 300 mg by mouth daily. Total daily dose=450 mg   escitalopram (LEXAPRO) 20 MG tablet Take 20 mg by mouth daily.    fentaNYL (DURAGESIC) 100 MCG/HR UNWRAP AND APPLY 1 PATCH TO THE SKIN EVERY 48 HOURS. ALTERNATE ARMS WITH EACH USE   fluticasone (FLOVENT HFA) 110 MCG/ACT inhaler Inhale 1 puff into the lungs in the morning and at bedtime.   gabapentin (NEURONTIN) 300 MG capsule Take 300 mg by mouth 3 (three) times daily. As needed.   leucovorin (WELLCOVORIN) 5 MG tablet Take 5 mg by mouth 2 (two) times a week.   lidocaine (XYLOCAINE) 2 % solution Use as directed 10 mLs in the mouth or throat every 6 (six) hours as needed (stomach pain).   linaclotide (LINZESS) 145 MCG CAPS capsule Take 145 mcg by mouth daily before breakfast.   LORazepam (ATIVAN) 1 MG tablet Take 2 mg by mouth at bedtime.  methotrexate (RHEUMATREX) 2.5 MG tablet Take 15 mg by mouth once a week. 5 times weekly   methylphenidate (RITALIN) 20 MG tablet Take 20 mg by mouth 2 (two) times daily.    oxyCODONE-acetaminophen (PERCOCET) 10-325 MG tablet Take 1 tablet by mouth 5 (five) times daily as needed.   pantoprazole (PROTONIX) 40 MG tablet Take 40 mg by mouth 2 (two) times daily.   promethazine (PHENERGAN) 25 MG tablet Take 25 mg by mouth every 6 (six) hours as needed for nausea.   Spacer/Aero-Holding Rudean Curt To use with MDI   fentaNYL (DURAGESIC - DOSED MCG/HR) 100 MCG/HR Place 100 mcg onto the skin every other day.    fentaNYL (DURAGESIC) 100 MCG/HR Unwrap and apply 1 patch onto the skin every other day (EVERY 48 HOURS). ALTERNATE  ARMS WITH EACH USE.   methocarbamol (ROBAXIN) 500 MG tablet Take 500-1,000 mg by mouth 4 (four) times daily as needed.   oxyCODONE (OXY IR/ROXICODONE) 5 MG immediate release tablet Take 10 mg by mouth every 4 (four) hours as needed. (Patient not taking: Reported on 01/18/2023)   oxyCODONE-acetaminophen (PERCOCET) 10-325 MG tablet Take 1-2 tablets by mouth every 6 (six) hours as needed for pain.   pantoprazole (PROTONIX) 20 MG tablet Take 1 tablet (20 mg total) by mouth daily. (Patient not taking: Reported on 09/08/2023)   predniSONE (DELTASONE) 10 MG tablet 3 tabs by mouth per day for 3 days,2tabs per day for 3 days,1tab per day for 3 days (Patient not taking: Reported on 09/08/2023)   [DISCONTINUED] ferrous sulfate (FERROUSUL) 325 (65 FE) MG tablet Take 1 tablet (325 mg total) by mouth 3 (three) times daily with meals.   No facility-administered encounter medications on file as of 09/08/2023.    Allergies (verified) Morphine, Pollen extract, and Sulfa antibiotics   History: Past Medical History:  Diagnosis Date   ADD (attention deficit disorder)    Anxiety    Arthritis    Bronchitis    12-23-15 being seen today for bronchitis, ? 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   Hyperlipemia    Lyme disease    Osteoarthritis    Osteopenia    Sleep apnea    cpap setting at 9   Past Surgical History:  Procedure Laterality Date   ABDOMINAL HYSTERECTOMY     1985 partial   BACK SURGERY     lower   CARPAL TUNNEL RELEASE Right    EYE SURGERY Left    Retinal surgery   HIP SURGERY Right 2005   Hip replacement   JOINT  REPLACEMENT     KNEE ARTHROSCOPY Right    LAPAROSCOPIC GASTRIC BANDING  2005   MAXIMUM ACCESS (MAS)POSTERIOR LUMBAR INTERBODY FUSION (PLIF) 1 LEVEL N/A 12/26/2014   Procedure: FOR MAXIMUM ACCESS (MAS) POSTERIOR LUMBAR INTERBODY FUSION (PLIF) 1 LEVEL;  Surgeon: Tia Alert, MD;  Location: [facility];  Service: Neurosurgery;  Laterality: N/A;  FOR MAXIMUM ACCESS (MAS) POSTERIOR LUMBAR INTERBODY FUSION (PLIF) 1 LEVEL LUMBAR 4-5   TENDON REPAIR Right 09/2022  forearm   TOTAL HIP ARTHROPLASTY Left 04/17/2013   Procedure: TOTAL HIP ARTHROPLASTY ANTERIOR APPROACH;  Surgeon: Shelda Pal, MD;  Location: [facility];  Service: Orthopedics;  Laterality: Left;   TOTAL HIP REVISION Right 01/02/2019   Procedure: TOTAL HIP REVISION;  Surgeon: Durene Romans, MD;  Location: [facility];  Service: Orthopedics;  Laterality: Right;  90 mins   Family History  Problem Relation Age of Onset   Lung disease Son    Heart disease Father    Social History   Socioeconomic History   Marital status: Married    Spouse name: Aurther Loft   Number of children: 2   Years of education: Not on file   Highest education level: Not on file  Occupational History   Occupation: Retired    Comment: [employer]  Tobacco Use   Smoking status: Former    Current packs/day: 0.00    Average packs/day: 1.5 packs/day for 22.0 years (33.1 ttl pk-yrs)    Types: Cigarettes    Start date: 01/16/1972    Quit date: 12/27/1993    Years since quitting: 29.7   Smokeless tobacco: Never  Vaping Use   Vaping status: Never Used  Substance and Sexual Activity   Alcohol use: No    Alcohol/week: 0.0 standard drinks of alcohol   Drug use: No   Sexual activity: Not on file  Other Topics Concern   Not on file  Social History Narrative   Originally from [place]. Previously has lived in [place], [place], [place], & [place]. No international travel. Previously has worked with exposure to saccharin, fluoride, silica, etc inhaled fumes and dusts. Currently has dogs. Remote  exposure to a cockatiel for 1.5 years.       Lost one child in 2020.   Social Determinants of Health   Financial Resource Strain: Low Risk  (09/08/2023)   Overall Financial Resource Strain (CARDIA)    Difficulty of Paying Living Expenses: Not hard at all  Food Insecurity: No Food Insecurity (09/08/2023)   Hunger Vital Sign    Worried About Running Out of Food in the Last Year: Never true    Ran Out of Food in the Last Year: Never true  Transportation Needs: No Transportation Needs (09/08/2023)   PRAPARE - Administrator, Civil Service (Medical): No    Lack of Transportation (Non-Medical): No  Physical Activity: Insufficiently Active (09/08/2023)   Exercise Vital Sign    Days of Exercise per Week: 2 days    Minutes of Exercise per Session: 40 min  Stress: No Stress Concern Present (09/08/2023)   [employer] of Occupational Health - Occupational Stress Questionnaire    Feeling of Stress : Only a little  Social Connections: Moderately Integrated (09/08/2023)   Social Connection and Isolation Panel [NHANES]    Frequency of Communication with Friends and Family: More than three times a week    Frequency of Social Gatherings with Friends and Family: More than three times a week    Attends Religious Services: 1 to 4 times per year    Active Member of [employer] or Organizations: No    Attends Engineer, structural: Never    Marital Status: Married    Tobacco Counseling Counseling given: Not Answered   Clinical Intake:  Pre-visit preparation completed: Yes  Pain : 0-10 Pain Score: 6  Pain Type: Chronic pain Pain Onset: More than a month ago Pain Frequency: Constant     BMI - recorded: 30.7 Nutritional Risks: Nausea/ vomitting/  diarrhea (occasional nausea) Diabetes: No CBG done?: No Did pt. bring in CBG monitor from home?: No  How often do you need to have someone help you when you read instructions, pamphlets, or other written materials from your doctor  or pharmacy?: 1 - Never  Interpreter Needed?: No  Information entered by :: Whyatt Klinger, RMA   Activities of Daily Living    09/08/2023    3:03 PM  In your present state of health, do you have any difficulty performing the following activities:  Hearing? 0  Vision? 0  Difficulty concentrating or making decisions? 1  Comment has ADHD  Walking or climbing stairs? 0  Dressing or bathing? 0  Doing errands, shopping? 0  Preparing Food and eating ? N  Using the Toilet? N  In the past six months, have you accidently leaked urine? N  Do you have problems with loss of bowel control? N  Managing your Medications? N  Managing your Finances? N  Housekeeping or managing your Housekeeping? N    Patient Care Team: Myrlene Broker, MD as PCP - General (Internal Medicine) Martina Sinner, MD (Inactive) as Referring Physician (Neurology) Janalyn Harder, MD (Inactive) as Consulting Physician (Dermatology)  Indicate any recent Medical Services you may have received from other than [facility] providers in the past year (date may be approximate).     Assessment:   This is a routine wellness examination for Randilee.  Hearing/Vision screen Hearing Screening - Comments:: Denies hearing difficulties   Vision Screening - Comments:: Wears eyeglasses.   Goals Addressed               This Visit's Progress     Patient Stated (pt-stated)        Would like to lose 10 more lbs.      Depression Screen    09/08/2023    3:42 PM 01/18/2023    2:53 PM 09/10/2022    2:37 PM 06/21/2022    4:04 PM  PHQ 2/9 Scores  PHQ - 2 Score 0 0 2 0  PHQ- 9 Score 6  9 0    Fall Risk    09/08/2023    3:37 PM 01/18/2023    2:53 PM 09/10/2022    2:38 PM 06/21/2022    4:04 PM 08/24/2021   11:55 AM  Fall Risk   Falls in the past year? 0 0 0 0 0  Number falls in past yr: 0 0 0 0 0  Injury with Fall? 0 0 0 0 0  Risk for fall due to : No Fall Risks No Fall Risks   No Fall Risks  Follow up Falls evaluation  completed;Falls prevention discussed Falls evaluation completed   Falls evaluation completed    MEDICARE RISK AT HOME: Medicare Risk at Home Any stairs in or around the home?: Yes If so, are there any without handrails?: Yes Home free of loose throw rugs in walkways, pet beds, electrical cords, etc?: Yes Adequate lighting in your home to reduce risk of falls?: Yes Life alert?: No Use of a cane, walker or w/c?: No Grab bars in the bathroom?: No Shower chair or bench in shower?: No Elevated toilet seat or a handicapped toilet?: No  TIMED UP AND GO:  Was the test performed? No    Cognitive Function:        09/08/2023    3:39 PM  6CIT Screen  What Year? 0 points  What month? 0 points  What time? 0 points  Count  back from 20 0 points  Months in reverse 0 points  Repeat phrase 0 points  Total Score 0 points    Immunizations Immunization History  Administered Date(s) Administered   Fluad Quad(high Dose 65+) 11/03/2021, 09/10/2022   Influenza, High Dose Seasonal PF 11/15/2019, 09/17/2020   Influenza,inj,Quad PF,6+ Mos 12/23/2016   Influenza-Unspecified 12/23/2016   PFIZER(Purple Top)SARS-COV-2 Vaccination 03/11/2020, 04/01/2020, 01/03/2021   PNEUMOCOCCAL CONJUGATE-20 09/10/2022   Pneumococcal Conjugate-13 11/15/2019   Pneumococcal Polysaccharide-23 09/03/2016   Tdap 06/25/2015    TDAP status: Up to date  Flu Vaccine status: Due, Education has been provided regarding the importance of this vaccine. Advised may receive this vaccine at local pharmacy or Health Dept. Aware to provide a copy of the vaccination record if obtained from local pharmacy or Health Dept. Verbalized acceptance and understanding.  Pneumococcal vaccine status: Up to date  Covid-19 vaccine status: Information provided on how to obtain vaccines.   Qualifies for Shingles Vaccine? Yes   Zostavax completed No   Shingrix Completed?: No.    Education has been provided regarding the importance of this  vaccine. Patient has been advised to call insurance company to determine out of pocket expense if they have not yet received this vaccine. Advised may also receive vaccine at local pharmacy or Health Dept. Verbalized acceptance and understanding.  Screening Tests Health Maintenance  Topic Date Due   Zoster Vaccines- Shingrix (1 of 2) Never done   Colonoscopy  Never done   DEXA SCAN  Never done   INFLUENZA VACCINE  07/28/2023   COVID-19 Vaccine (4 - 2023-24 season) 08/28/2023   MAMMOGRAM  01/19/2024 (Originally 01/28/2020)   Medicare Annual Wellness (AWV)  09/07/2024   DTaP/Tdap/Td (2 - Td or Tdap) 06/24/2025   Pneumonia Vaccine 25+ Years old  Completed   Hepatitis C Screening  Completed   HPV VACCINES  Aged Out    Health Maintenance  Health Maintenance Due  Topic Date Due   Zoster Vaccines- Shingrix (1 of 2) Never done   Colonoscopy  Never done   DEXA SCAN  Never done   INFLUENZA VACCINE  07/28/2023   COVID-19 Vaccine (4 - 2023-24 season) 08/28/2023    Colorectal cancer screening: Referral to GI placed For the COLOGUARD 09/08/2023. Pt aware the office will call re: appt.  Mammogram status: Ordered 09/08/2023. Pt provided with contact info and advised to call to schedule appt.   Bone Density status: Ordered 09/08/2023. Pt provided with contact info and advised to call to schedule appt.  Lung Cancer Screening: (Low Dose CT Chest recommended if Age 84-80 years, 20 pack-year currently smoking OR have quit w/in 15years.) does qualify.   Lung Cancer Screening: 09/17/2022  Additional Screening:  Hepatitis C Screening: does qualify; Completed 06/21/2022  Vision Screening: Recommended annual ophthalmology exams for early detection of glaucoma and other disorders of the eye. Is the patient up to date with their annual eye exam?  Yes  Who is the provider or what is the name of the office in which the patient attends annual eye exams? Dr. Elmer Picker If pt is not established with a provider,  would they like to be referred to a provider to establish care? No .   Dental Screening: Recommended annual dental exams for proper oral hygiene   Community Resource Referral / Chronic Care Management: CRR required this visit?  No   CCM required this visit?  No     Plan:     I have personally reviewed and noted the following in  the patient's chart:   Medical and social history Use of alcohol, tobacco or illicit drugs  Current medications and supplements including opioid prescriptions. Patient is currently taking opioid prescriptions. Information provided to patient regarding non-opioid alternatives. Patient advised to discuss non-opioid treatment plan with their provider. Functional ability and status Nutritional status Physical activity Advanced directives List of other physicians Hospitalizations, surgeries, and ER visits in previous 12 months Vitals Screenings to include cognitive, depression, and falls Referrals and appointments  In addition, I have reviewed and discussed with patient certain preventive protocols, quality metrics, and best practice recommendations. A written personalized care plan for preventive services as well as general preventive health recommendations were provided to patient.     Abigail Huerta, CMA   09/08/2023   After Visit Summary: (MyChart) Due to this being a telephonic visit, the after visit summary with patients personalized plan was offered to patient via MyChart   Nurse Notes: Patient is due for Flu vaccine and a Shingrix vaccicne.  She is also due for a Mammogram, DEXA and a colonoscopy, however she would prefer to do a Cologuard instead.  I have placed the orders for the Mammogram and DEXA today in a telephone encounter.  Patient stated that she would like Dr. Okey Dupre to give her a refill on the Lidocaine 2% solution for her pain due to removal of her Lapband.  She also would like to discuss if there is something her PCP can recommend for  her energy.  I informed patient that she does need to call the office to schedule a visit with Dr. Okey Dupre as soon as she can.  Patient had no other concerns to address today.

## 2023-09-08 NOTE — Patient Instructions (Addendum)
Abigail Huerta , Thank you for taking time to come for your Medicare Wellness Visit. I appreciate your ongoing commitment to your health goals. Please review the following plan we discussed and let me know if I can assist you in the future.   Referrals/Orders/Follow-Ups/Clinician Recommendations: You are due for your Flu vaccine.  Remember to call [facility], at [phone] to schedule your Mammogram and a Bone Density screening.  You also need to call the office to schedule a annual visit here with your PCP.  [phone].  Each day, aim for 6 glasses of water, plenty of protein in your diet and try to get up and walk/ stretch every hour for 5-10 minutes at a time.     Managing Pain Without Opioids Opioids are strong medicines used to treat moderate to severe pain. For some people, especially those who have long-term (chronic) pain, opioids may not be the best choice for pain management due to: Side effects like nausea, constipation, and sleepiness. The risk of addiction (opioid use disorder). The longer you take opioids, the greater your risk of addiction. Pain that lasts for more than 3 months is called chronic pain. Managing chronic pain usually requires more than one approach and is often provided by a team of health care providers working together (multidisciplinary approach). Pain management may be done at a pain management center or pain clinic. How to manage pain without the use of opioids Use non-opioid medicines Non-opioid medicines for pain may include: Over-the-counter or prescription non-steroidal anti-inflammatory drugs (NSAIDs). These may be the first medicines used for pain. They work well for muscle and bone pain, and they reduce swelling. Acetaminophen. This over-the-counter medicine may work well for milder pain but not swelling. Antidepressants. These may be used to treat chronic pain. A certain type of antidepressant (tricyclics) is often used. These  medicines are given in lower doses for pain than when used for depression. Anticonvulsants. These are usually used to treat seizures but may also reduce nerve (neuropathic) pain. Muscle relaxants. These relieve pain caused by sudden muscle tightening (spasms). You may also use a pain medicine that is applied to the skin as a patch, cream, or gel (topical analgesic), such as a numbing medicine. These may cause fewer side effects than medicines taken by mouth. Do certain therapies as directed Some therapies can help with pain management. They include: Physical therapy. You will do exercises to gain strength and flexibility. A physical therapist may teach you exercises to move and stretch parts of your body that are weak, stiff, or painful. You can learn these exercises at physical therapy visits and practice them at home. Physical therapy may also involve: Massage. Heat wraps or applying heat or cold to affected areas. Electrical signals that interrupt pain signals (transcutaneous electrical nerve stimulation, TENS). Weak lasers that reduce pain and swelling (low-level laser therapy). Signals from your body that help you learn to regulate pain (biofeedback). Occupational therapy. This helps you to learn ways to function at home and work with less pain. Recreational therapy. This involves trying new activities or hobbies, such as a physical activity or drawing. Mental health therapy, including: Cognitive behavioral therapy (CBT). This helps you learn coping skills for dealing with pain. Acceptance and commitment therapy (ACT) to change the way you think and react to pain. Relaxation therapies, including muscle relaxation exercises and mindfulness-based stress reduction. Pain management counseling. This may be individual, family, or group counseling.  Receive medical treatments Medical treatments for pain management  include: Nerve block injections. These may include a pain blocker and  anti-inflammatory medicines. You may have injections: Near the spine to relieve chronic back or neck pain. Into joints to relieve back or joint pain. Into nerve areas that supply a painful area to relieve body pain. Into muscles (trigger point injections) to relieve some painful muscle conditions. A medical device placed near your spine to help block pain signals and relieve nerve pain or chronic back pain (spinal cord stimulation device). Acupuncture. Follow these instructions at home Medicines Take over-the-counter and prescription medicines only as told by your health care provider. If you are taking pain medicine, ask your health care providers about possible side effects to watch out for. Do not drive or use heavy machinery while taking prescription opioid pain medicine. Lifestyle  Do not use drugs or alcohol to reduce pain. If you drink alcohol, limit how much you have to: 0-1 drink a day for women who are not pregnant. 0-2 drinks a day for men. Know how much alcohol is in a drink. In the [place] one drink equals one 12 oz bottle of beer (355 mL), one 5 oz glass of wine (148 mL), or one 1 oz glass of hard liquor (44 mL). Do not use any products that contain nicotine or tobacco. These products include cigarettes, chewing tobacco, and vaping devices, such as e-cigarettes. If you need help quitting, ask your health care provider. Eat a healthy diet and maintain a healthy weight. Poor diet and excess weight may make pain worse. Eat foods that are high in fiber. These include fresh fruits and vegetables, whole grains, and beans. Limit foods that are high in fat and processed sugars, such as fried and sweet foods. Exercise regularly. Exercise lowers stress and may help relieve pain. Ask your health care provider what activities and exercises are safe for you. If your health care provider approves, join an exercise class that combines movement and stress reduction. Examples include yoga and tai  chi. Get enough sleep. Lack of sleep may make pain worse. Lower stress as much as possible. Practice stress reduction techniques as told by your therapist. General instructions Work with all your pain management providers to find the treatments that work best for you. You are an important member of your pain management team. There are many things you can do to reduce pain on your own. Consider joining an online or in-person support group for people who have chronic pain. Keep all follow-up visits. This is important. Where to find more information You can find more information about managing pain without opioids from: American Academy of Pain Medicine: painmed.org Institute for Chronic Pain: instituteforchronicpain.org American Chronic Pain Association: theacpa.org Contact a health care provider if: You have side effects from pain medicine. Your pain gets worse or does not get better with treatments or home therapy. You are struggling with anxiety or depression. Summary Many types of pain can be managed without opioids. Chronic pain may respond better to pain management without opioids. Pain is best managed when you and a team of health care providers work together. Pain management without opioids may include non-opioid medicines, medical treatments, physical therapy, mental health therapy, and lifestyle changes. Tell your health care providers if your pain gets worse or is not being managed well enough. This information is not intended to replace advice given to you by your health care provider. Make sure you discuss any questions you have with your health care provider. Document Revised: 03/25/2021 Document Reviewed: 03/25/2021 Elsevier  Patient Education  2024 [employer].   This is a list of the screening recommended for you and due dates:  Health Maintenance  Topic Date Due   Zoster (Shingles) Vaccine (1 of 2) Never done   Colon Cancer Screening  Never done   DEXA scan (bone  density measurement)  Never done   Flu Shot  07/28/2023   COVID-19 Vaccine (4 - 2023-24 season) 08/28/2023   Mammogram  01/19/2024*   Medicare Annual Wellness Visit  09/07/2024   DTaP/Tdap/Td vaccine (2 - Td or Tdap) 06/24/2025   Pneumonia Vaccine  Completed   Hepatitis C Screening  Completed   HPV Vaccine  Aged Out  *Topic was postponed. The date shown is not the original due date.    Advanced directives: (Copy Requested) Please bring a copy of your health care power of attorney and living will to the office to be added to your chart at your convenience.  Next Medicare Annual Wellness Visit scheduled for next year: Yes

## 2023-09-09 NOTE — Telephone Encounter (Signed)
Also you have ordered a diagnostic mammogram is patient having breast problem? If so you cannot order this also would need to be visit with provider. Also you did not put an acceptable diagnosis for bone density do you have a list of what is used for these? I changed dx and signed that.

## 2023-09-13 ENCOUNTER — Telehealth: Payer: Self-pay | Admitting: Internal Medicine

## 2023-09-13 NOTE — Telephone Encounter (Signed)
Patient would like to know if Dr. Okey Dupre is able to refill  lidocaine (XYLOCAINE) 2 % solution. She said it was originally prescribed by an ER doctor. Patient is scheduled for 10/04/2023. Best callback is [phone].

## 2023-09-13 NOTE — Telephone Encounter (Signed)
Yes, a routine screening mammogram is a screening mammogram. You have ordered a diagnostic mammogram which is used when a problem is present. If screening reorder screening and cancel diagnostic. For a problem a provider would need to order this.

## 2023-09-13 NOTE — Telephone Encounter (Signed)
I would not refill (this is from about a year ago). If having acute problem please schedule acute visit sooner

## 2023-09-13 NOTE — Telephone Encounter (Signed)
Good morning,  I was going off the last mammogram, as I thought patients have a routine mammograms for annual wellness visits.  As far as the DEXA, the dx code on my sheet is z78.  Please let me know of other codes that can be use for the dx.  Thank you Dr. Okey Dupre

## 2023-09-14 NOTE — Telephone Encounter (Signed)
lvm

## 2023-09-15 NOTE — Telephone Encounter (Signed)
Pt states that she will wait and there is no new acute problems and will wait until October visit

## 2023-10-04 ENCOUNTER — Ambulatory Visit: Payer: Medicare Other | Admitting: Internal Medicine

## 2023-10-04 ENCOUNTER — Encounter: Payer: Self-pay | Admitting: Internal Medicine

## 2023-10-04 VITALS — BP 124/80 | HR 80 | Temp 98.2°F | Ht 67.0 in | Wt 200.0 lb

## 2023-10-04 DIAGNOSIS — Z23 Encounter for immunization: Secondary | ICD-10-CM | POA: Diagnosis not present

## 2023-10-04 DIAGNOSIS — R7301 Impaired fasting glucose: Secondary | ICD-10-CM | POA: Diagnosis not present

## 2023-10-04 DIAGNOSIS — Z1211 Encounter for screening for malignant neoplasm of colon: Secondary | ICD-10-CM | POA: Diagnosis not present

## 2023-10-04 DIAGNOSIS — L4050 Arthropathic psoriasis, unspecified: Secondary | ICD-10-CM

## 2023-10-04 DIAGNOSIS — F11282 Opioid dependence with opioid-induced sleep disorder: Secondary | ICD-10-CM

## 2023-10-04 DIAGNOSIS — R7303 Prediabetes: Secondary | ICD-10-CM

## 2023-10-04 DIAGNOSIS — J42 Unspecified chronic bronchitis: Secondary | ICD-10-CM

## 2023-10-04 LAB — CBC
HCT: 40.6 % (ref 36.0–46.0)
Hemoglobin: 13.1 g/dL (ref 12.0–15.0)
MCHC: 32.4 g/dL (ref 30.0–36.0)
MCV: 88.5 fL (ref 78.0–100.0)
Platelets: 246 10*3/uL (ref 150.0–400.0)
RBC: 4.59 Mil/uL (ref 3.87–5.11)
RDW: 14.1 % (ref 11.5–15.5)
WBC: 10.4 10*3/uL (ref 4.0–10.5)

## 2023-10-04 LAB — HEMOGLOBIN A1C: Hgb A1c MFr Bld: 5.7 % (ref 4.6–6.5)

## 2023-10-04 MED ORDER — LIDOCAINE VISCOUS HCL 2 % MT SOLN: 10.0000 mL | Freq: Four times a day (QID) | OROMUCOSAL | 0 refills | Status: DC | PRN

## 2023-10-04 MED ORDER — TOPIRAMATE 50 MG PO TABS: 50.0000 mg | ORAL_TABLET | Freq: Every day | ORAL | 3 refills | Status: DC

## 2023-10-04 NOTE — Progress Notes (Unsigned)
Subjective:   Patient ID: Abigail Huerta, female    DOB: 1956-07-19, 67 y.o.   MRN: [id number]  HPI The patient is a 67 YO female coming in for wanting to lose weight and follow up.   Review of Systems  Constitutional: Negative.   HENT: Negative.    Eyes: Negative.   Respiratory:  Negative for cough, chest tightness and shortness of breath.   Cardiovascular:  Negative for chest pain, palpitations and leg swelling.  Gastrointestinal:  Negative for abdominal distention, abdominal pain, constipation, diarrhea, nausea and vomiting.  Musculoskeletal: Negative.   Skin: Negative.   Neurological: Negative.   Psychiatric/Behavioral: Negative.      Objective:  Physical Exam Constitutional:      Appearance: She is well-developed.  HENT:     Head: Normocephalic and atraumatic.  Cardiovascular:     Rate and Rhythm: Normal rate and regular rhythm.  Pulmonary:     Effort: Pulmonary effort is normal. No respiratory distress.     Breath sounds: Normal breath sounds. No wheezing or rales.  Abdominal:     General: Bowel sounds are normal. There is no distension.     Palpations: Abdomen is soft.     Tenderness: There is no abdominal tenderness. There is no rebound.  Musculoskeletal:     Cervical back: Normal range of motion.  Skin:    General: Skin is warm and dry.  Neurological:     Mental Status: She is alert and oriented to person, place, and time.     Coordination: Coordination normal.     Vitals:   10/04/23 1438  BP: 124/80  Pulse: 80  Temp: 98.2 F (36.8 C)  TempSrc: Oral  SpO2: 96%  Weight: 200 lb (90.7 kg)  Height: 5\' 7"  (1.702 m)    Assessment & Plan:  Flu shot given at visit

## 2023-10-04 NOTE — Patient Instructions (Addendum)
We can use metformin or topamax to help with the weight.  We have sent the colon cancer screening in the mail.

## 2023-10-05 ENCOUNTER — Encounter: Payer: Self-pay | Admitting: Internal Medicine

## 2023-10-05 LAB — LIPID PANEL
Cholesterol: 226 mg/dL — ABNORMAL HIGH (ref 0–200)
HDL: 83.4 mg/dL (ref >39.00)
LDL Cholesterol: 129 mg/dL — ABNORMAL HIGH (ref 0–99)
NonHDL: 142.86
Total CHOL/HDL Ratio: 3
Triglycerides: 67 mg/dL (ref 0.0–149.0)
VLDL: 13.4 mg/dL (ref 0.0–40.0)

## 2023-10-05 LAB — COMPREHENSIVE METABOLIC PANEL
ALT: 10 U/L (ref 0–35)
AST: 14 U/L (ref 0–37)
Albumin: 4.2 g/dL (ref 3.5–5.2)
Alkaline Phosphatase: 78 U/L (ref 39–117)
BUN: 15 mg/dL (ref 6–23)
CO2: 30 meq/L (ref 19–32)
Calcium: 9.8 mg/dL (ref 8.4–10.5)
Chloride: 103 meq/L (ref 96–112)
Creatinine, Ser: 0.94 mg/dL (ref 0.40–1.20)
GFR: 61.82 mL/min (ref >60.00)
Glucose, Bld: 86 mg/dL (ref 70–99)
Potassium: 3.8 meq/L (ref 3.5–5.1)
Sodium: 140 meq/L (ref 135–145)
Total Bilirubin: 0.4 mg/dL (ref 0.2–1.2)
Total Protein: 6.6 g/dL (ref 6.0–8.3)

## 2023-10-06 NOTE — Assessment & Plan Note (Signed)
Using albuterol prn and overall stable.

## 2023-10-06 NOTE — Assessment & Plan Note (Addendum)
Checking HgA1c and adjust as needed. Rx topiramate 50 mg at bedtime to help with weight. Is already taking wellbutrin and she declined metformin and does not have coverage for glp-1.

## 2023-10-06 NOTE — Assessment & Plan Note (Signed)
She has not reduced opioids and does not want to discuss today. Previously she and daughter mentioned episodes where she is unable to arouse from sleep after taking meds.

## 2023-10-20 ENCOUNTER — Ambulatory Visit: Payer: Medicare Other

## 2023-10-23 LAB — COLOGUARD: COLOGUARD: NEGATIVE

## 2023-10-24 ENCOUNTER — Encounter: Payer: Self-pay | Admitting: Internal Medicine

## 2023-10-24 LAB — COLOGUARD: Cologuard: NEGATIVE

## 2023-11-07 ENCOUNTER — Encounter: Payer: Self-pay | Admitting: Internal Medicine

## 2023-11-08 MED ORDER — TOPIRAMATE 100 MG PO TABS: 100.0000 mg | ORAL_TABLET | Freq: Every day | ORAL | 3 refills | Status: DC

## 2024-01-21 ENCOUNTER — Other Ambulatory Visit: Payer: Self-pay | Admitting: Internal Medicine

## 2024-03-08 ENCOUNTER — Other Ambulatory Visit: Payer: Self-pay | Admitting: Internal Medicine

## 2024-03-08 NOTE — Telephone Encounter (Unsigned)
 Copied from CRM [id number]. Topic: Clinical - Medication Refill >> Mar 08, 2024 12:18 PM Efraim Kaufmann C wrote: Most Recent Primary Care Visit:  Provider: Hillard Danker A  Department: [facility]  Visit Type: OFFICE VISIT  Date: 10/04/2023  Medication: topiramate (TOPAMAX) 100 MG tablet   Has the patient contacted their pharmacy? Yes (Agent: If no, request that the patient contact the pharmacy for the refill. If patient does not wish to contact the pharmacy document the reason why and proceed with request.) (Agent: If yes, when and what did the pharmacy advise?)  Is this the correct pharmacy for this prescription? Yes If no, delete pharmacy and type the correct one.  This is the patient's preferred pharmacy:  [id number] - [place], [place] - [address] AT [facility] [address] [place] [address] Phone: [phone] Fax: [phone]   Has the prescription been filled recently? No  Is the patient out of the medication? No  Has the patient been seen for an appointment in the last year OR does the patient have an upcoming appointment? Yes  Can we respond through MyChart? Yes  Agent: Please be advised that Rx refills may take up to 3 business days. We ask that you follow-up with your pharmacy.

## 2024-03-09 MED ORDER — TOPIRAMATE 100 MG PO TABS: 100.0000 mg | ORAL_TABLET | Freq: Every day | ORAL | 3 refills | Status: DC

## 2024-04-20 ENCOUNTER — Other Ambulatory Visit: Payer: Self-pay | Admitting: Internal Medicine

## 2024-04-25 ENCOUNTER — Ambulatory Visit
Admission: RE | Admit: 2024-04-25 | Discharge: 2024-04-25 | Disposition: A | Discharge status: Home / Self Care | Payer: Medicare Other | Source: Ambulatory Visit | Attending: Internal Medicine | Admitting: Internal Medicine

## 2024-04-25 DIAGNOSIS — E2839 Other primary ovarian failure: Secondary | ICD-10-CM

## 2024-04-30 ENCOUNTER — Encounter: Payer: Self-pay | Admitting: Internal Medicine

## 2024-05-07 ENCOUNTER — Encounter (HOSPITAL_COMMUNITY): Payer: Self-pay

## 2024-05-09 ENCOUNTER — Ambulatory Visit: Payer: Self-pay

## 2024-05-10 NOTE — Telephone Encounter (Signed)
 FYI

## 2024-05-14 ENCOUNTER — Ambulatory Visit (INDEPENDENT_AMBULATORY_CARE_PROVIDER_SITE_OTHER): Admitting: Internal Medicine

## 2024-05-14 ENCOUNTER — Encounter: Payer: Self-pay | Admitting: Internal Medicine

## 2024-05-14 VITALS — BP 118/80 | HR 73 | Temp 98.2°F | Ht 67.0 in | Wt 191.0 lb

## 2024-05-14 DIAGNOSIS — E663 Overweight: Secondary | ICD-10-CM

## 2024-05-14 DIAGNOSIS — M810 Age-related osteoporosis without current pathological fracture: Secondary | ICD-10-CM | POA: Diagnosis not present

## 2024-05-14 DIAGNOSIS — G4710 Hypersomnia, unspecified: Secondary | ICD-10-CM | POA: Diagnosis not present

## 2024-05-14 NOTE — Progress Notes (Signed)
   Subjective:   Patient ID: Abigail Huerta, female    DOB: 1956-08-16, 68 y.o.   MRN: [id number]  HPI The patient is a 68 YO female coming in for weight management and osteoporosis and hypersomnia.  Review of Systems  Constitutional: Negative.   HENT: Negative.    Eyes: Negative.   Respiratory:  Negative for cough, chest tightness and shortness of breath.   Cardiovascular:  Negative for chest pain, palpitations and leg swelling.  Gastrointestinal:  Negative for abdominal distention, abdominal pain, constipation, diarrhea, nausea and vomiting.  Musculoskeletal: Negative.   Skin: Negative.   Neurological: Negative.   Psychiatric/Behavioral: Negative.      Objective:  Physical Exam Constitutional:      Appearance: She is well-developed.  HENT:     Head: Normocephalic and atraumatic.  Cardiovascular:     Rate and Rhythm: Normal rate and regular rhythm.  Pulmonary:     Effort: Pulmonary effort is normal. No respiratory distress.     Breath sounds: Normal breath sounds. No wheezing or rales.  Abdominal:     General: Bowel sounds are normal. There is no distension.     Palpations: Abdomen is soft.     Tenderness: There is no abdominal tenderness. There is no rebound.  Musculoskeletal:     Cervical back: Normal range of motion.  Skin:    General: Skin is warm and dry.  Neurological:     Mental Status: She is alert and oriented to person, place, and time.     Coordination: Coordination normal.     Vitals:   05/14/24 1313  BP: 118/80  Pulse: 73  Temp: 98.2 F (36.8 C)  TempSrc: Oral  SpO2: 99%  Weight: 191 lb (86.6 kg)  Height: 5\' 7"  (1.702 m)    Assessment & Plan:

## 2024-05-14 NOTE — Patient Instructions (Signed)
 We will get the prolia going and let you know about that.   Let us  know if you want the sleep condition checked out.

## 2024-05-15 ENCOUNTER — Encounter: Payer: Self-pay | Admitting: Internal Medicine

## 2024-05-15 ENCOUNTER — Telehealth: Payer: Self-pay

## 2024-05-15 MED ORDER — DENOSUMAB 60 MG/ML ~~LOC~~ SOSY: 60.0000 mg | PREFILLED_SYRINGE | Freq: Once | SUBCUTANEOUS | Status: AC

## 2024-05-15 NOTE — Telephone Encounter (Signed)
 Prolia VOB initiated via [email or web address]  Next Prolia inj DUE: NEW START

## 2024-05-16 DIAGNOSIS — G4710 Hypersomnia, unspecified: Secondary | ICD-10-CM | POA: Insufficient documentation

## 2024-05-16 NOTE — Assessment & Plan Note (Signed)
 She is asking about trulicity for weight and she is no longer obese and has had success with weight loss on her own (down about 9 pounds in 7 months which is appropriate). Asked her to continue same strategies for slow weight loss over time and weight maintenance.

## 2024-05-16 NOTE — Assessment & Plan Note (Signed)
 Need new start of prolia given recent dexa which is ordered today. Once PA done this can be administered and will be given every 6 months for life.

## 2024-05-16 NOTE — Assessment & Plan Note (Signed)
 Unknown etiology. Offered sleep medicine but she declines today. She states mom and sister have same. I have previously heard patient's daughter talk about after taking medication her mom is not able to be aroused easily. The patient is stating she can just sleep. She is on multiple sedating and activating substances which could impact this.

## 2024-05-22 ENCOUNTER — Other Ambulatory Visit (HOSPITAL_COMMUNITY): Payer: Self-pay

## 2024-05-22 NOTE — Telephone Encounter (Signed)
 Abigail Huerta

## 2024-05-22 NOTE — Telephone Encounter (Signed)
 Pt ready for scheduling for PROLIA on or after : 05/22/24  Option# 1: Buy/Bill (Office supplied medication)  Out-of-pocket cost due at time of clinic visit: $0  Number of injection/visits approved: ---  Primary: MEDICARE Prolia co-insurance: 20% Admin fee co-insurance: 20%  Secondary: [employer] Prolia co-insurance: covers the Medicare Part B co-insurance and 100% of the excess charges. This plan does not cover the Medicare Part B deductible.  Admin fee co-insurance:   Medical Benefit Details: Date Benefits were checked: 05/18/24 Deductible: $257 Met of $257 Required/ Coinsurance: 20%/ Admin Fee: 20%  Prior Auth: N/A PA# Expiration Date:   # of doses approved: ----------------------------------------------------------------------- Option# 2- Med Obtained from pharmacy:  Pharmacy benefit: Copay $51.25 (Paid to pharmacy) Admin Fee: 20% (Pay at clinic)  Prior Auth: N/A PA# Expiration Date:   # of doses approved:   If patient wants fill through the pharmacy benefit please send prescription to: [facility], and include estimated need by date in rx notes. Pharmacy will ship medication directly to the office.  Patient NOT eligible for Prolia Copay Card. Copay Card can make patient's cost as little as $25. Link to apply: [email or web address]://[email or web address]  ** This summary of benefits is an estimation of the patient's out-of-pocket cost. Exact cost may very based on individual plan coverage.

## 2024-06-05 ENCOUNTER — Ambulatory Visit
Admission: RE | Admit: 2024-06-05 | Discharge: 2024-06-05 | Disposition: A | Discharge status: Home / Self Care | Source: Ambulatory Visit | Attending: Internal Medicine | Admitting: Internal Medicine

## 2024-06-05 DIAGNOSIS — Z1231 Encounter for screening mammogram for malignant neoplasm of breast: Secondary | ICD-10-CM

## 2024-06-08 LAB — HM MAMMOGRAPHY

## 2024-06-21 ENCOUNTER — Other Ambulatory Visit (HOSPITAL_BASED_OUTPATIENT_CLINIC_OR_DEPARTMENT_OTHER): Payer: Self-pay

## 2024-06-21 ENCOUNTER — Other Ambulatory Visit (HOSPITAL_COMMUNITY): Payer: Self-pay

## 2024-06-21 ENCOUNTER — Encounter: Payer: Self-pay | Admitting: Internal Medicine

## 2024-06-21 MED ORDER — FENTANYL 100 MCG/HR TD PT72
1.0000 | MEDICATED_PATCH | TRANSDERMAL | 0 refills | Status: AC
  Filled 2024-06-21: qty 15, 30d supply, fill #0
  Filled 2024-06-21: qty 10, 20d supply, fill #0
  Filled 2024-07-12: qty 5, 10d supply, fill #1

## 2024-06-29 ENCOUNTER — Encounter: Payer: Self-pay | Admitting: Internal Medicine

## 2024-07-02 NOTE — Telephone Encounter (Signed)
**Note De-identified  [place] Obfuscation** Please advise 

## 2024-07-09 ENCOUNTER — Other Ambulatory Visit (HOSPITAL_COMMUNITY): Payer: Self-pay

## 2024-07-09 ENCOUNTER — Other Ambulatory Visit (HOSPITAL_BASED_OUTPATIENT_CLINIC_OR_DEPARTMENT_OTHER): Payer: Self-pay

## 2024-07-12 ENCOUNTER — Other Ambulatory Visit (HOSPITAL_BASED_OUTPATIENT_CLINIC_OR_DEPARTMENT_OTHER): Payer: Self-pay

## 2024-07-12 ENCOUNTER — Other Ambulatory Visit: Payer: Self-pay

## 2024-07-12 ENCOUNTER — Other Ambulatory Visit (HOSPITAL_COMMUNITY): Payer: Self-pay

## 2024-07-13 ENCOUNTER — Other Ambulatory Visit (HOSPITAL_BASED_OUTPATIENT_CLINIC_OR_DEPARTMENT_OTHER): Payer: Self-pay

## 2024-08-03 ENCOUNTER — Telehealth: Payer: Self-pay | Admitting: Internal Medicine

## 2024-08-03 NOTE — Telephone Encounter (Signed)
 I  have received and placed in the providers box

## 2024-08-03 NOTE — Telephone Encounter (Signed)
 Copied from CRM [id number]. Topic: General - Other >> Aug 03, 2024  9:04 AM Pinkey ORN wrote: Reason for CRM: [employer] >> Aug 03, 2024  9:06 AM Pinkey ORN wrote: Abigail Huerta - [employer]  Called on behalf of patient, wanting to know if [employer] had received the pre-authorization fax for patient's lumbar braces (back + right knee)

## 2024-08-06 NOTE — Telephone Encounter (Signed)
 I do not sign for these. If she has an orthopedic problem which a specialist thinks a brace would help they would order.

## 2024-08-13 ENCOUNTER — Telehealth: Payer: Self-pay

## 2024-08-13 NOTE — Telephone Encounter (Signed)
**Note De-identified  [place] Obfuscation** Please advise 

## 2024-08-13 NOTE — Telephone Encounter (Signed)
 Copied from CRM #[id number]. Topic: Clinical - Request for Lab/Test Order >> Aug 13, 2024  9:33 AM Logan F wrote: Reason for CRM: Pt is coming in on 9/2 for a physical and would like an order for labs to be placed before she comes in. Please contact pt once order has been put in. Please advise

## 2024-08-14 NOTE — Telephone Encounter (Signed)
 I typically advise to do day of visit. If any concerns by patient a second lab draw can be needed as only standard things are ordered prior to visit. Also there can be charges associated with these labs as most insurance companies do not cover most standard labs any longer so she is aware. Does she want prior?

## 2024-08-15 NOTE — Telephone Encounter (Signed)
 I have informed [employer] in regards to this that the provider does not sign for this, they verbalized they understood

## 2024-08-15 NOTE — Telephone Encounter (Signed)
 Called patient and relayed back message and patient verbalized she would can wait until after visit to have them done

## 2024-08-28 ENCOUNTER — Ambulatory Visit: Admitting: Internal Medicine

## 2024-09-21 ENCOUNTER — Encounter: Payer: Self-pay | Admitting: Internal Medicine

## 2024-09-24 ENCOUNTER — Ambulatory Visit (INDEPENDENT_AMBULATORY_CARE_PROVIDER_SITE_OTHER): Admitting: Internal Medicine

## 2024-09-24 VITALS — BP 126/80 | HR 83 | Temp 98.4°F | Ht 67.0 in | Wt 195.0 lb

## 2024-09-24 DIAGNOSIS — E66811 Obesity, class 1: Secondary | ICD-10-CM

## 2024-09-24 DIAGNOSIS — L4050 Arthropathic psoriasis, unspecified: Secondary | ICD-10-CM | POA: Diagnosis not present

## 2024-09-24 DIAGNOSIS — R7301 Impaired fasting glucose: Secondary | ICD-10-CM

## 2024-09-24 DIAGNOSIS — J410 Simple chronic bronchitis: Secondary | ICD-10-CM

## 2024-09-24 DIAGNOSIS — G4710 Hypersomnia, unspecified: Secondary | ICD-10-CM | POA: Diagnosis not present

## 2024-09-24 DIAGNOSIS — R7303 Prediabetes: Secondary | ICD-10-CM | POA: Diagnosis not present

## 2024-09-24 DIAGNOSIS — R5383 Other fatigue: Secondary | ICD-10-CM

## 2024-09-24 DIAGNOSIS — E538 Deficiency of other specified B group vitamins: Secondary | ICD-10-CM

## 2024-09-24 DIAGNOSIS — E559 Vitamin D deficiency, unspecified: Secondary | ICD-10-CM

## 2024-09-24 DIAGNOSIS — Z23 Encounter for immunization: Secondary | ICD-10-CM

## 2024-09-24 DIAGNOSIS — Z9884 Bariatric surgery status: Secondary | ICD-10-CM

## 2024-09-24 DIAGNOSIS — M810 Age-related osteoporosis without current pathological fracture: Secondary | ICD-10-CM

## 2024-09-24 DIAGNOSIS — F11282 Opioid dependence with opioid-induced sleep disorder: Secondary | ICD-10-CM

## 2024-09-24 LAB — CBC
HCT: 36.2 % (ref 36.0–46.0)
Hemoglobin: 12.3 g/dL (ref 12.0–15.0)
MCHC: 33.9 g/dL (ref 30.0–36.0)
MCV: 83.4 fl (ref 78.0–100.0)
Platelets: 245 K/uL (ref 150.0–400.0)
RBC: 4.34 Mil/uL (ref 3.87–5.11)
RDW: 14.8 % (ref 11.5–15.5)
WBC: 6.5 K/uL (ref 4.0–10.5)

## 2024-09-24 LAB — COMPREHENSIVE METABOLIC PANEL WITH GFR
ALT: 14 U/L (ref 0–35)
AST: 17 U/L (ref 0–37)
Albumin: 4 g/dL (ref 3.5–5.2)
Alkaline Phosphatase: 68 U/L (ref 39–117)
BUN: 13 mg/dL (ref 6–23)
CO2: 30 meq/L (ref 19–32)
Calcium: 9.6 mg/dL (ref 8.4–10.5)
Chloride: 101 meq/L (ref 96–112)
Creatinine, Ser: 0.79 mg/dL (ref 0.40–1.20)
GFR: 75.64 mL/min (ref >60.00)
Glucose, Bld: 106 mg/dL — ABNORMAL HIGH (ref 70–99)
Potassium: 3.9 meq/L (ref 3.5–5.1)
Sodium: 137 meq/L (ref 135–145)
Total Bilirubin: 0.5 mg/dL (ref 0.2–1.2)
Total Protein: 6.8 g/dL (ref 6.0–8.3)

## 2024-09-24 LAB — LIPID PANEL
Cholesterol: 201 mg/dL — ABNORMAL HIGH (ref 0–200)
HDL: 68.2 mg/dL (ref >39.00)
LDL Cholesterol: 114 mg/dL — ABNORMAL HIGH (ref 0–99)
NonHDL: 132.84
Total CHOL/HDL Ratio: 3
Triglycerides: 92 mg/dL (ref 0.0–149.0)
VLDL: 18.4 mg/dL (ref 0.0–40.0)

## 2024-09-24 LAB — HEMOGLOBIN A1C: Hgb A1c MFr Bld: 5.9 % (ref 4.6–6.5)

## 2024-09-24 MED ORDER — LIDOCAINE VISCOUS HCL 2 % MT SOLN: 10.0000 mL | Freq: Four times a day (QID) | OROMUCOSAL | 0 refills | Status: AC | PRN

## 2024-09-24 NOTE — Progress Notes (Unsigned)
 Subjective:   Patient ID: Abigail Huerta, female    DOB: 02-21-1956, 68 y.o.   MRN: [id number]  Discussed the use of AI scribe software for clinical note transcription with the patient, who gave verbal consent to proceed. History of Present Illness Abigail Huerta is a 68 year old female who presents with fatigue and weight management concerns.  She experiences significant fatigue and a lack of energy, describing it as having 'no desire to do anything.' She requires medication to sleep at night, as her mind does not 'shut off,' and upon waking, she feels as if 'a slab of concrete slept on me,' feeling stiff and achy. Despite maintaining her diet and physical activity, including mowing the front yard regularly, she has not seen significant weight loss and wishes to lose 20 more pounds.  She has a history of osteoporosis, osteoarthritis, and psoriatic arthritis, identified following a bone scan. She experiences occasional chest tightness, especially when climbing stairs, and uses a liquid lidocaine  preparation to manage severe chest pain that impedes her ability to breathe and speak. She experiences severe chest pain that impedes her ability to breathe and speak, and she has a history of lap band removal due to her body rejecting it.  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 Her kidney function was normal last year, with a creatinine level of 61, and she is concerned about maintaining this function.  She experiences constipation, which she attributes to her medications, including opioids, and aging. She has not had recent episodes of confusion or 'weird sleep things' that she previously experienced, where she would zone out and sleep for extended periods.  She mentions a skin lesion that is painful under the skin but does not believe it to be cancerous. No new skin spots, moles, or lumps.  Review of Systems   Constitutional: Negative.   HENT: Negative.    Eyes: Negative.   Respiratory:  Negative for cough, chest tightness and shortness of breath.   Cardiovascular:  Negative for chest pain, palpitations and leg swelling.  Gastrointestinal:  Negative for abdominal distention, abdominal pain, constipation, diarrhea, nausea and vomiting.  Musculoskeletal:  Positive for arthralgias and myalgias.  Skin: Negative.   Neurological: Negative.   Psychiatric/Behavioral:  Positive for dysphoric mood. The patient is nervous/anxious.     Objective:  Physical Exam Constitutional:      Appearance: She is well-developed.  HENT:     Head: Normocephalic and atraumatic.  Cardiovascular:     Rate and Rhythm: Normal rate and regular rhythm.  Pulmonary:     Effort: Pulmonary effort is normal. No respiratory distress.     Breath sounds: Normal breath sounds. No wheezing or rales.  Abdominal:     General: Bowel sounds are normal. There is no distension.     Palpations: Abdomen is soft.     Tenderness: There is no abdominal tenderness.  Musculoskeletal:        General: Tenderness present.     Cervical back: Normal range of motion.  Skin:    General: Skin is warm and dry.  Neurological:     Mental Status: She is alert and oriented to person, place, and time.     Coordination: Coordination normal.    Vitals:   09/24/24 1454  BP: 126/80  Pulse: 83  Temp: 98.4 F (36.9 C)  TempSrc: Oral  SpO2: 96%  Weight: 195 lb (88.5 kg)  Height: 5'  7 (1.702 m)  Flu shot given at visit Assessment and Plan Assessment & Plan Osteoporosis   Osteoporosis is confirmed by bone scan. She prefers to continue care with her orthopedic provider but is open to receiving injections at the current facility once available. The branded generics are functionally the same as Prolia  but are more cost-effective due to insurance preferences. Coordinate with her to administer Prolia  or its branded generic once available in  mid-October. Inform the orthopedic provider about the administration of Prolia  injections.  Hypersomnia   She reports hypersomnia with episodes of excessive sleepiness and lack of energy. There are no recent episodes of confusion or altered mental status. Check thyroid  function and vitamin levels to rule out contributing factors.  Opioid induced Constipation   Chronic constipation is likely related to opioid use. She acknowledges the constipating effects of medications and age-related changes.  Obesity   She is obese with a desire to lose an additional 20 pounds. Her weight has remained stable since spring despite efforts to maintain diet and physical activity. Check thyroid  function and vitamin levels to rule out contributing factors. Encourage continued physical activity and dietary management.  History of lap band removal with esophageal spasm   Esophageal spasms cause severe chest pain relieved by oral lidocaine . Suspected scar tissue from the lap band removal contributes to symptoms. Refill oral lidocaine  for esophageal spasms.

## 2024-09-25 LAB — VITAMIN D 25 HYDROXY (VIT D DEFICIENCY, FRACTURES): VITD: 35.57 ng/mL (ref 30.00–100.00)

## 2024-09-25 LAB — VITAMIN B12: Vitamin B-12: 408 pg/mL (ref 211–911)

## 2024-09-25 LAB — TSH: TSH: 1.17 u[IU]/mL (ref 0.35–5.50)

## 2024-09-26 ENCOUNTER — Encounter: Payer: Self-pay | Admitting: Internal Medicine

## 2024-09-27 ENCOUNTER — Encounter: Payer: Self-pay | Admitting: Internal Medicine

## 2024-09-27 ENCOUNTER — Ambulatory Visit: Payer: Self-pay | Admitting: Internal Medicine

## 2024-09-27 ENCOUNTER — Ambulatory Visit

## 2024-09-27 DIAGNOSIS — R7303 Prediabetes: Secondary | ICD-10-CM

## 2024-09-27 NOTE — Assessment & Plan Note (Signed)
 Osteoporosis is confirmed by bone scan. She prefers to continue care with her orthopedic provider but is open to receiving injections at the current facility once available. The branded generics are functionally the same as Prolia  but are more cost-effective due to insurance preferences. Coordinate with her to administer Prolia  or its branded generic once available in mid-October. Inform the orthopedic provider about the administration of Prolia  injections.

## 2024-09-27 NOTE — Assessment & Plan Note (Signed)
 Checking thyroid  and vitamin levels to assess for metabolic causes.

## 2024-09-27 NOTE — Assessment & Plan Note (Signed)
 Continues to take chronic opioids with opioid induced constipation and likely fatigue. She is no longer having episodes of decreased responsiveness which is positive. I had reached out to her pain management provider to have them reduce her dosing which they did not do and patient did not want to do previously as well.

## 2024-09-27 NOTE — Assessment & Plan Note (Signed)
 Esophageal spasms cause severe chest pain relieved by oral lidocaine . Suspected scar tissue from the lap band removal contributes to symptoms. Refill oral lidocaine  for esophageal spasms.

## 2024-09-27 NOTE — Assessment & Plan Note (Signed)
 Checking HgA1c and adjust as needed.

## 2024-09-27 NOTE — Assessment & Plan Note (Signed)
 Simple and continues to use albutero prn. Symptoms have not progressed recently. No flare.

## 2024-09-27 NOTE — Assessment & Plan Note (Signed)
 She reports hypersomnia with episodes of excessive sleepiness and lack of energy. There are no recent episodes of confusion or altered mental status. Check thyroid  function and vitamin levels to rule out contributing factors.

## 2024-09-27 NOTE — Assessment & Plan Note (Signed)
 She is obese with a desire to lose an additional 20 pounds. Her weight has remained stable since spring despite efforts to maintain diet and physical activity. Check thyroid  function and vitamin levels to rule out contributing factors. Encourage continued physical activity and dietary management.

## 2024-09-28 NOTE — Telephone Encounter (Signed)
 Copied from CRM [id number]. Topic: Clinical - Medical Advice >> Sep 28, 2024  8:23 AM [place] E wrote: Reason for CRM: Patient questioning what type/dosage of Vitamin B and D she should take in regards to her lab results. Patient also wants to get started on a weight loss medication and would like PCP's opinion on which one would be best. Please call patient to discuss.

## 2024-10-10 ENCOUNTER — Ambulatory Visit

## 2024-11-01 ENCOUNTER — Ambulatory Visit

## 2024-11-05 ENCOUNTER — Encounter: Payer: Self-pay | Admitting: Internal Medicine

## 2024-11-05 DIAGNOSIS — E66811 Obesity, class 1: Secondary | ICD-10-CM

## 2024-11-07 ENCOUNTER — Encounter: Payer: Self-pay | Admitting: Internal Medicine

## 2024-11-07 DIAGNOSIS — R7303 Prediabetes: Secondary | ICD-10-CM

## 2024-11-29 ENCOUNTER — Encounter: Admitting: Nutrition

## 2024-12-07 ENCOUNTER — Ambulatory Visit
